# Patient Record
Sex: Female | Born: 1960
Health system: Southern US, Community
[De-identification: ages and names within clinical notes are randomized; demographics above are authoritative.]

## PROBLEM LIST (undated history)

## (undated) DIAGNOSIS — I341 Nonrheumatic mitral (valve) prolapse: Secondary | ICD-10-CM

## (undated) DIAGNOSIS — C4360 Malignant melanoma of unspecified upper limb, including shoulder: Secondary | ICD-10-CM

## (undated) DIAGNOSIS — I4891 Unspecified atrial fibrillation: Secondary | ICD-10-CM

## (undated) DIAGNOSIS — K219 Gastro-esophageal reflux disease without esophagitis: Secondary | ICD-10-CM

## (undated) DIAGNOSIS — F419 Anxiety disorder, unspecified: Secondary | ICD-10-CM

## (undated) HISTORY — DX: Anxiety disorder, unspecified: F41.9

## (undated) HISTORY — DX: Nonrheumatic mitral (valve) prolapse: I34.1

## (undated) HISTORY — DX: Unspecified atrial fibrillation: I48.91

## (undated) HISTORY — PX: MELANOMA EXCISION: SHX5266

## (undated) HISTORY — PX: REPLACEMENT TOTAL KNEE: SUR1224

## (undated) HISTORY — PX: ARTHROSCOPIC REPAIR ACL: SUR80

## (undated) HISTORY — DX: Malignant melanoma of unspecified upper limb, including shoulder: C43.60

## (undated) HISTORY — DX: Gastro-esophageal reflux disease without esophagitis: K21.9

---

## 1999-05-07 ENCOUNTER — Other Ambulatory Visit: Admission: RE | Admit: 1999-05-07 | Discharge: 1999-05-07 | Payer: Self-pay | Admitting: Obstetrics and Gynecology

## 2001-02-12 ENCOUNTER — Other Ambulatory Visit: Admission: RE | Admit: 2001-02-12 | Discharge: 2001-02-12 | Payer: Self-pay | Admitting: Obstetrics and Gynecology

## 2003-03-08 ENCOUNTER — Other Ambulatory Visit: Admission: RE | Admit: 2003-03-08 | Discharge: 2003-03-08 | Payer: Self-pay | Admitting: Obstetrics and Gynecology

## 2007-07-29 ENCOUNTER — Ambulatory Visit: Payer: Self-pay | Admitting: Internal Medicine

## 2013-05-11 ENCOUNTER — Ambulatory Visit: Payer: Self-pay | Admitting: Obstetrics and Gynecology

## 2013-05-18 ENCOUNTER — Ambulatory Visit: Payer: Self-pay | Admitting: Obstetrics and Gynecology

## 2013-06-23 ENCOUNTER — Ambulatory Visit: Payer: Self-pay | Admitting: Surgery

## 2013-06-23 HISTORY — PX: BREAST BIOPSY: SHX20

## 2013-07-12 ENCOUNTER — Ambulatory Visit: Payer: Self-pay | Admitting: Anesthesiology

## 2013-07-14 ENCOUNTER — Ambulatory Visit: Payer: Self-pay | Admitting: Surgery

## 2013-07-14 HISTORY — PX: BREAST EXCISIONAL BIOPSY: SUR124

## 2013-07-18 LAB — PATHOLOGY REPORT

## 2014-04-06 ENCOUNTER — Ambulatory Visit: Payer: Self-pay | Admitting: Cardiovascular Disease

## 2014-07-08 ENCOUNTER — Ambulatory Visit: Payer: Self-pay

## 2014-08-15 DIAGNOSIS — M1712 Unilateral primary osteoarthritis, left knee: Secondary | ICD-10-CM | POA: Insufficient documentation

## 2014-08-15 HISTORY — DX: Unilateral primary osteoarthritis, left knee: M17.12

## 2014-08-18 ENCOUNTER — Ambulatory Visit: Payer: Self-pay | Admitting: Unknown Physician Specialty

## 2015-03-23 NOTE — Op Note (Signed)
PATIENT NAME:  Kathleen Perkins, Kathleen Perkins MR#:  [id number] DATE OF BIRTH:  07-13-1961  DATE OF PROCEDURE:  07/14/2013  PREOPERATIVE DIAGNOSIS: Left breast mass.   POSTOPERATIVE DIAGNOSIS: Left breast mass.   PROCEDURE: Excision, left breast mass.   SURGEON: Rochel Brome, M.D.   ANESTHESIA:  Local with monitored anesthesia care.   INDICATIONS: This 54 year old female had an ultrasound finding of a small nodule, six o'clock position left breast. Fine needle aspiration was done removing a small amount of fluid, but did not resolve the nodule, as there appeared to be a solid component and cytology was nondiagnostic. We discussed observation versus excision, and the patient much preferred to have it excised for definitive diagnosis. She did have preop ultrasound-guided insertion of Kopans wire, which entered the breast at approximately the five o'clock position. Also had followup mammogram. I have reviewed the ultrasound and the mammogram with Dr. Martinique, the radiologist.   DESCRIPTION OF PROCEDURE:  The patient was placed on the operating table in the supine position and monitored and sedated by the anesthesia staff. The dressing was removed from the left breast exposing the Kopans wire. The wire was cut 2 cm from the skin. The breast was prepared with ChloraPrep and draped in a sterile manner.   The skin from approximately 5 o'clock  to 7 o'clock position of the left breast, some 2 cm below the border of the areola, was infiltrated with 1% Xylocaine with epinephrine. A transversely oriented curvilinear incision was made approximately 3 cm in length, carried down through subcutaneous tissues and encountered the wire. Next, a sample of tissue surrounding the thick portion of the wire was excised. This portion of the tissue was approximately 1.2 x 1.2 x 2.8 cm in dimension and was excised and submitted with the Kopans wire  and tagged for routine pathology. The wound was inspected. One clamped bleeding point was  suture ligated with 4-0 chromic. Several tiny bleeding points were cauterized. The wound was carefully inspected and hemostasis was intact. Next, the wound was closed with a running 5-0 Monocryl subcuticular suture and Dermabond. The patient tolerated the surgery satisfactorily and was then prepared for transfer to the recovery room.     ____________________________ Lenna Sciara. Rochel Brome, MD jws:dmm D: 07/14/2013 12:14:31 ET T: 07/14/2013 12:29:41 ET JOB#: [id number]  cc: Loreli Dollar, MD, <Dictator> Loreli Dollar MD ELECTRONICALLY SIGNED 07/15/2013 19:04

## 2015-04-14 IMAGING — US ULTRASOUND LEFT BREAST
1 series · 13 of 25 positions shown · non-contrast
Comparison: none

REASON FOR EXAM: av lt asymmetry
COMMENTS:

PROCEDURE:     US  - US BREAST LEFT  - May 18, 2013 [DATE]
RESULT:
Comparisons: Mammograms from [REDACTED], [HOSPITAL] 08/29/2010 and
07/28/2007.

[Series 1: ultrasound left breast · 0.08mm/px · 13 of 40 slices shown]
[im 1/40]
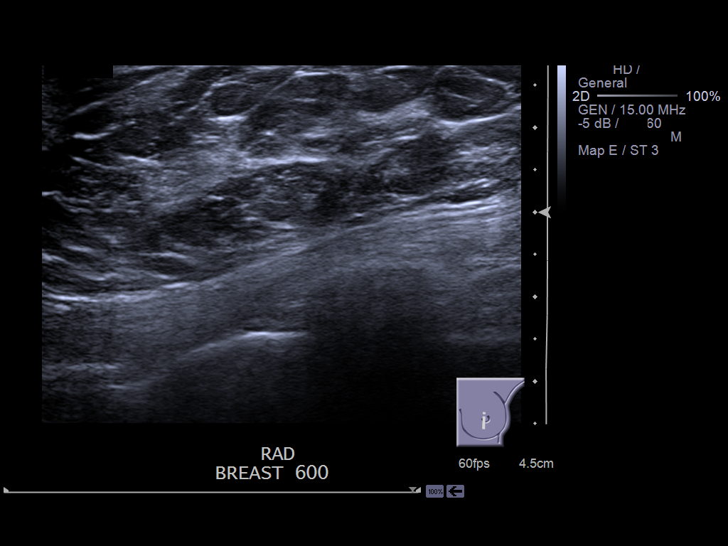
[im 4/40]
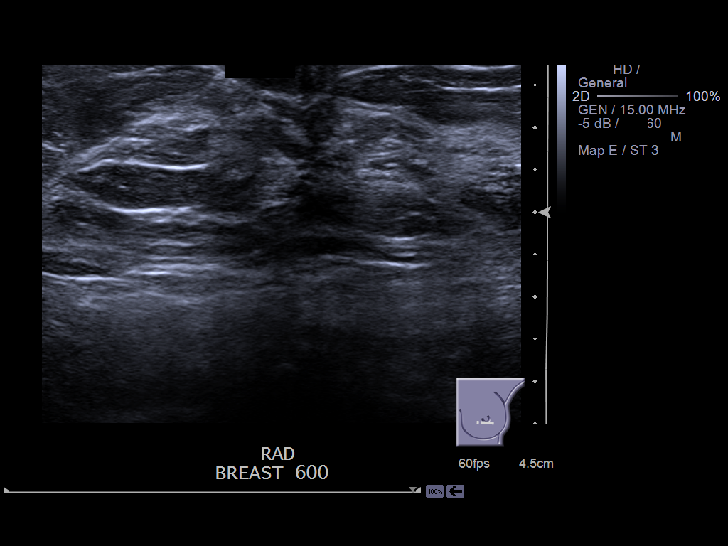
[im 7/40]
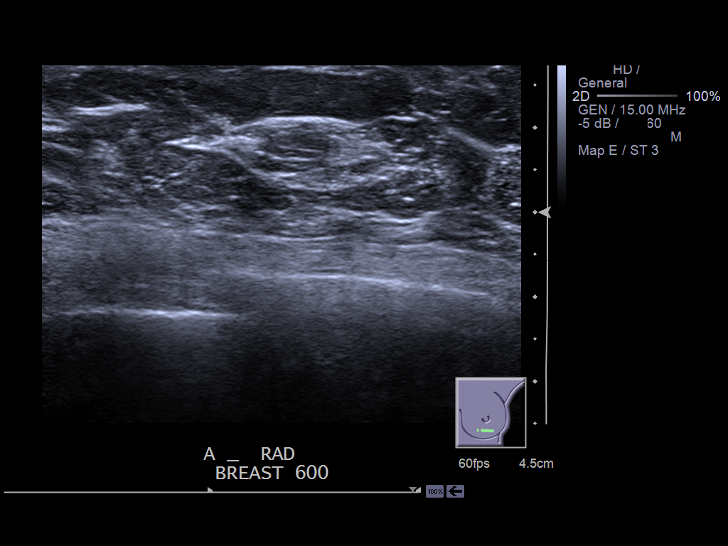
[im 10/40]
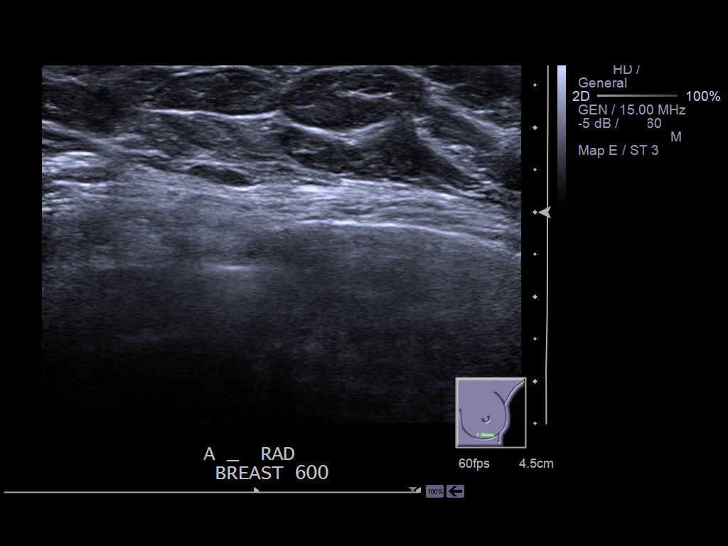
[im 14/40]
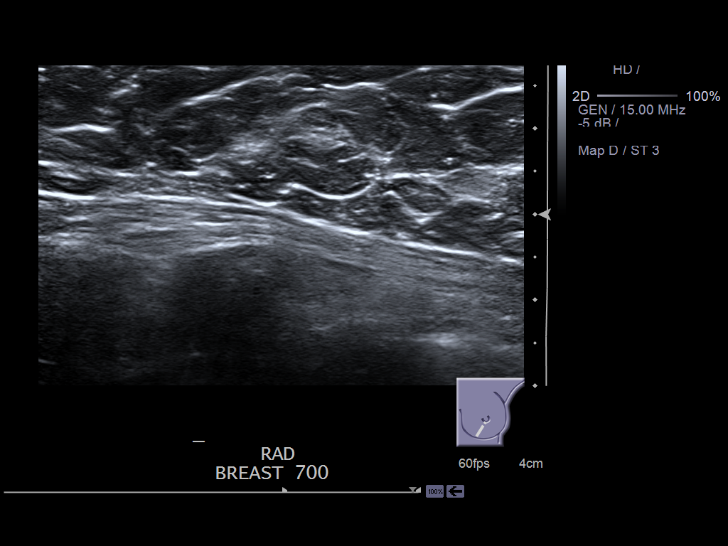
[im 17/40]
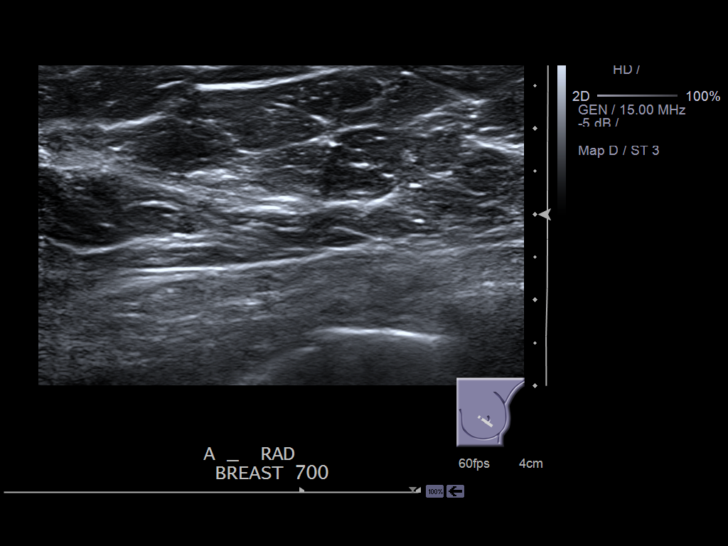
[im 20/40]
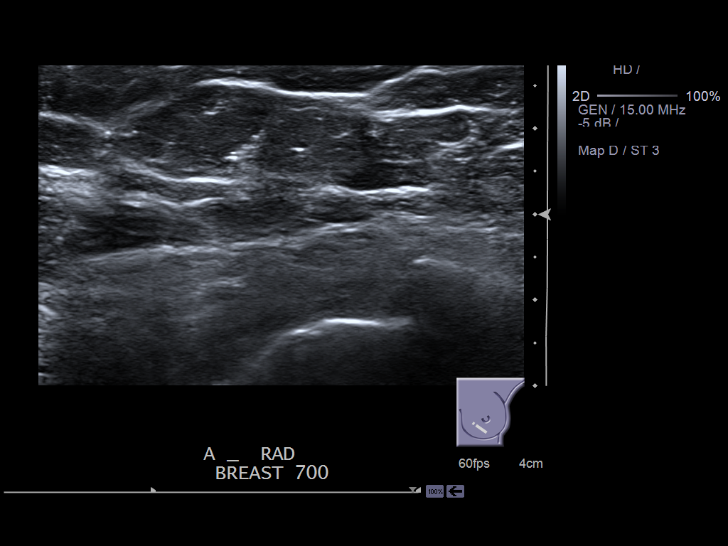
[im 23/40]
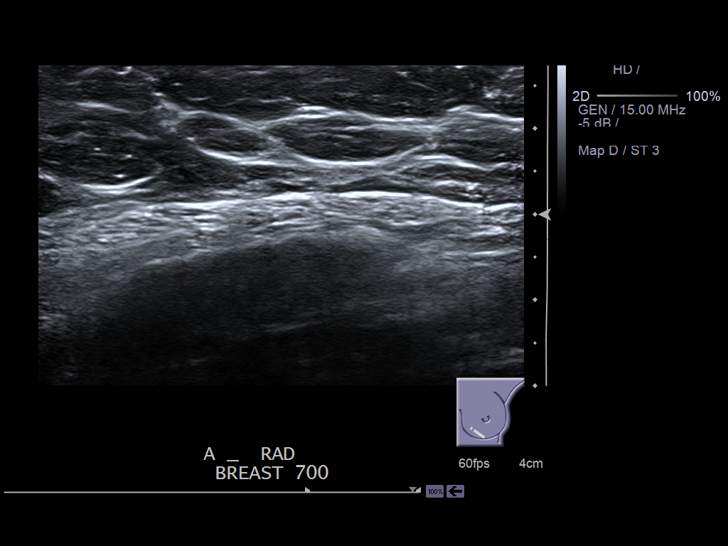
[im 27/40]
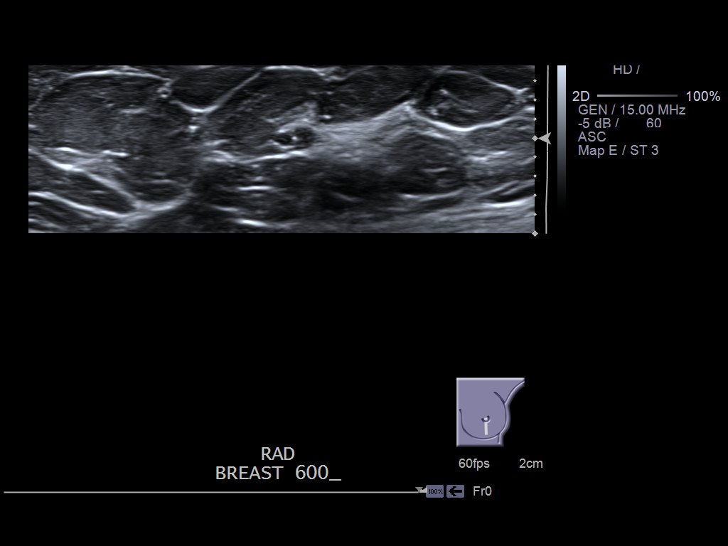
[im 30/40]
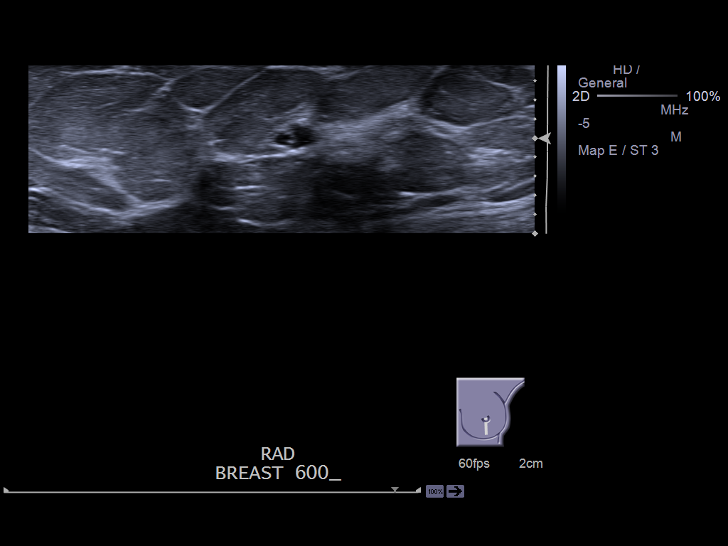
[im 33/40]
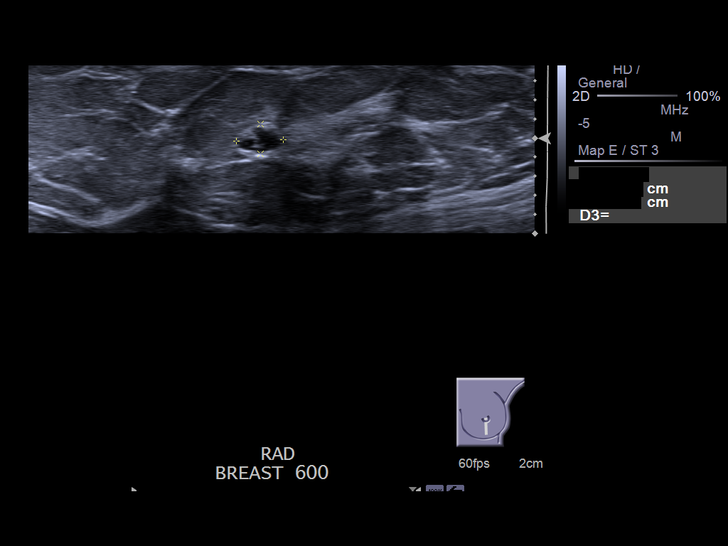
[im 36/40]
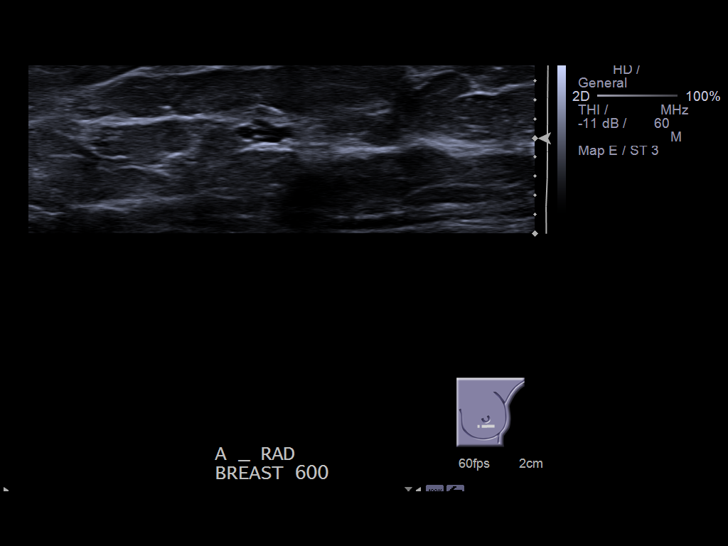
[im 40/40]
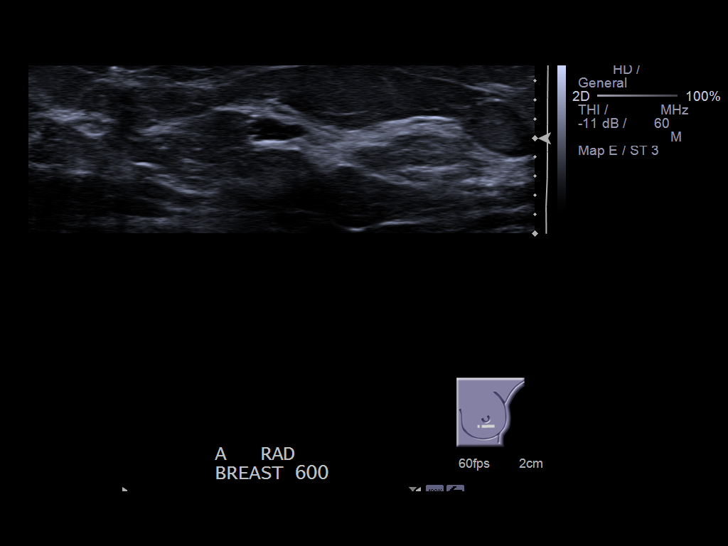

[13 of 25 positions shown; findings below may reference images not displayed]

FINDINGS: True lateral and spot compression magnification views were performed to
evaluate the findings in the left CC view on the screening mammograms. These
views demonstrate a small round mass at or just medial of the level of the
nipple line in the posterior depth. It measures approximately 5 mm in
greatest dimension. Most of the borders are well circumscribed, while other
borders are obscured by overlapping fibroglandular tissue. It appears to be
located in the inferior aspect of the left breast on the spot compression
magnification view at approximately 6-7 o'clock.

Real time ultrasound was performed of the inferior left breast from 6
o'clock through 7 o'clock. A 6 o'clock there is a small oval mass which is
mostly anechoic though there are some linear areas of hyperechogenicity
within the mass. There does not appear to be significant posterior acoustic
enhancement or shadowing. It measures 0.6 x 0.5 x 0.3 cm. This could
represent a complicated/septated cyst, but a solid mass cannot be excluded.
IMPRESSION: BI-RADS: Category 4 - Suspicious Abnormality.

1. There is a small mass in the inferior left breast at 6 o'clock which
could represent a complicated/septated cyst. However, a small solid mass
cannot be excluded. Attempt at cyst aspiration, with potential for biopsy if
solid, is recommended.

2. This was discussed with the patient in person immediately after the
ultrasound examination.

## 2015-04-14 IMAGING — MG MM ADDITIONAL VIEWS AT NO CHARGE
2 series · 4 of 4 positions shown · non-contrast
Comparison: none

REASON FOR EXAM: av lt asymmetry
COMMENTS:

PROCEDURE:     MAM - MAM DGTL ADD VW LT  SCR  - May 18, 2013  [DATE]
RESULT:
Comparisons: Mammograms from [REDACTED], [HOSPITAL] 08/29/2010 and
07/28/2007.

[L ML · left · 2 of 2 slices shown (1 of 2)]
[im 1/2]
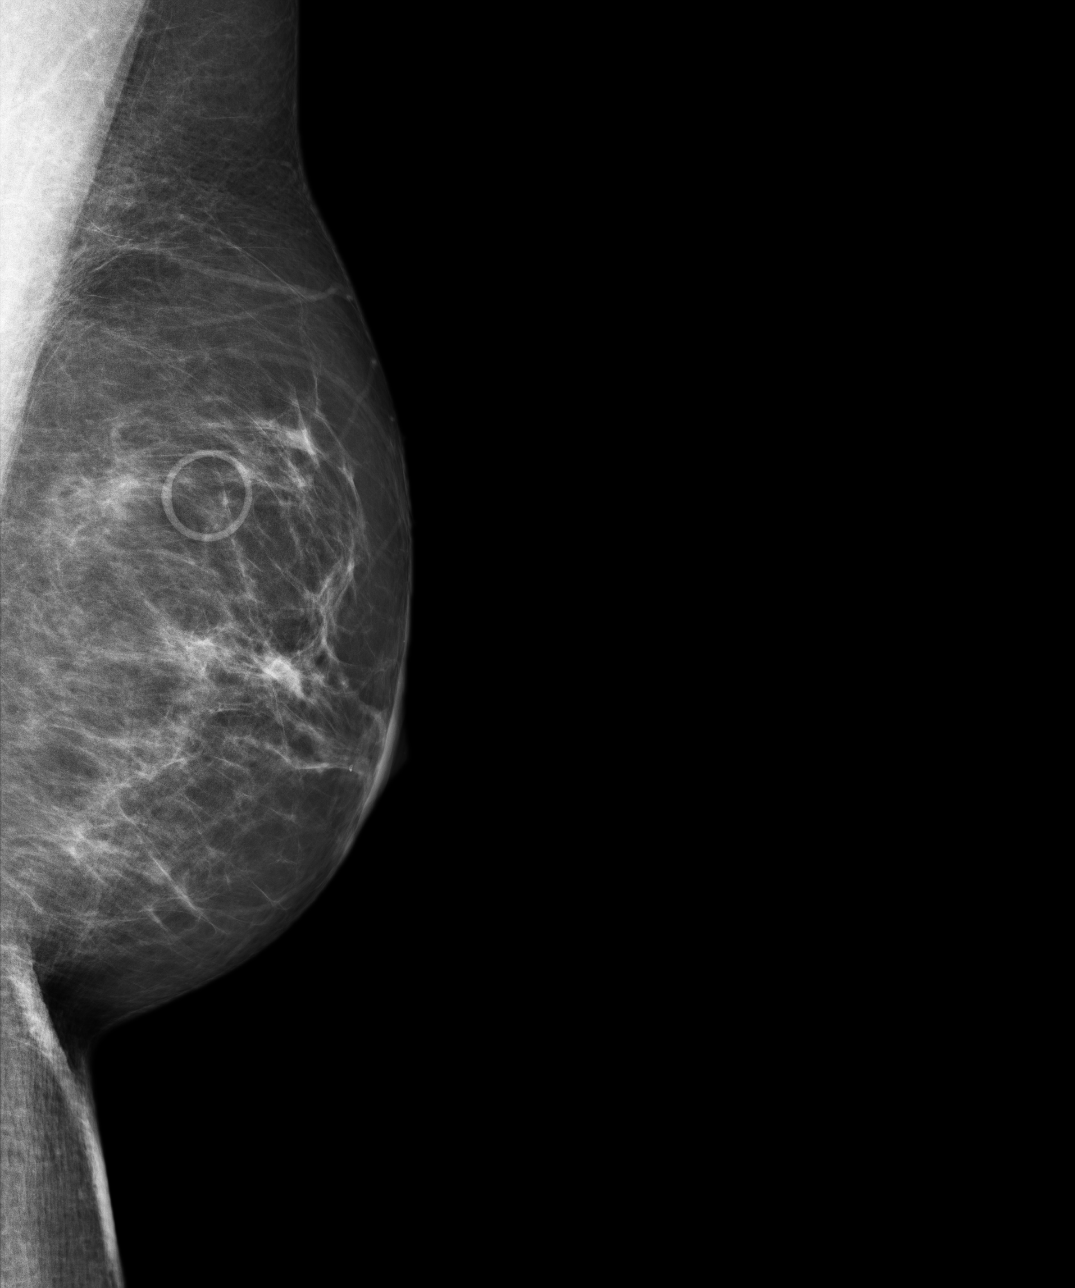
[im 2/2]
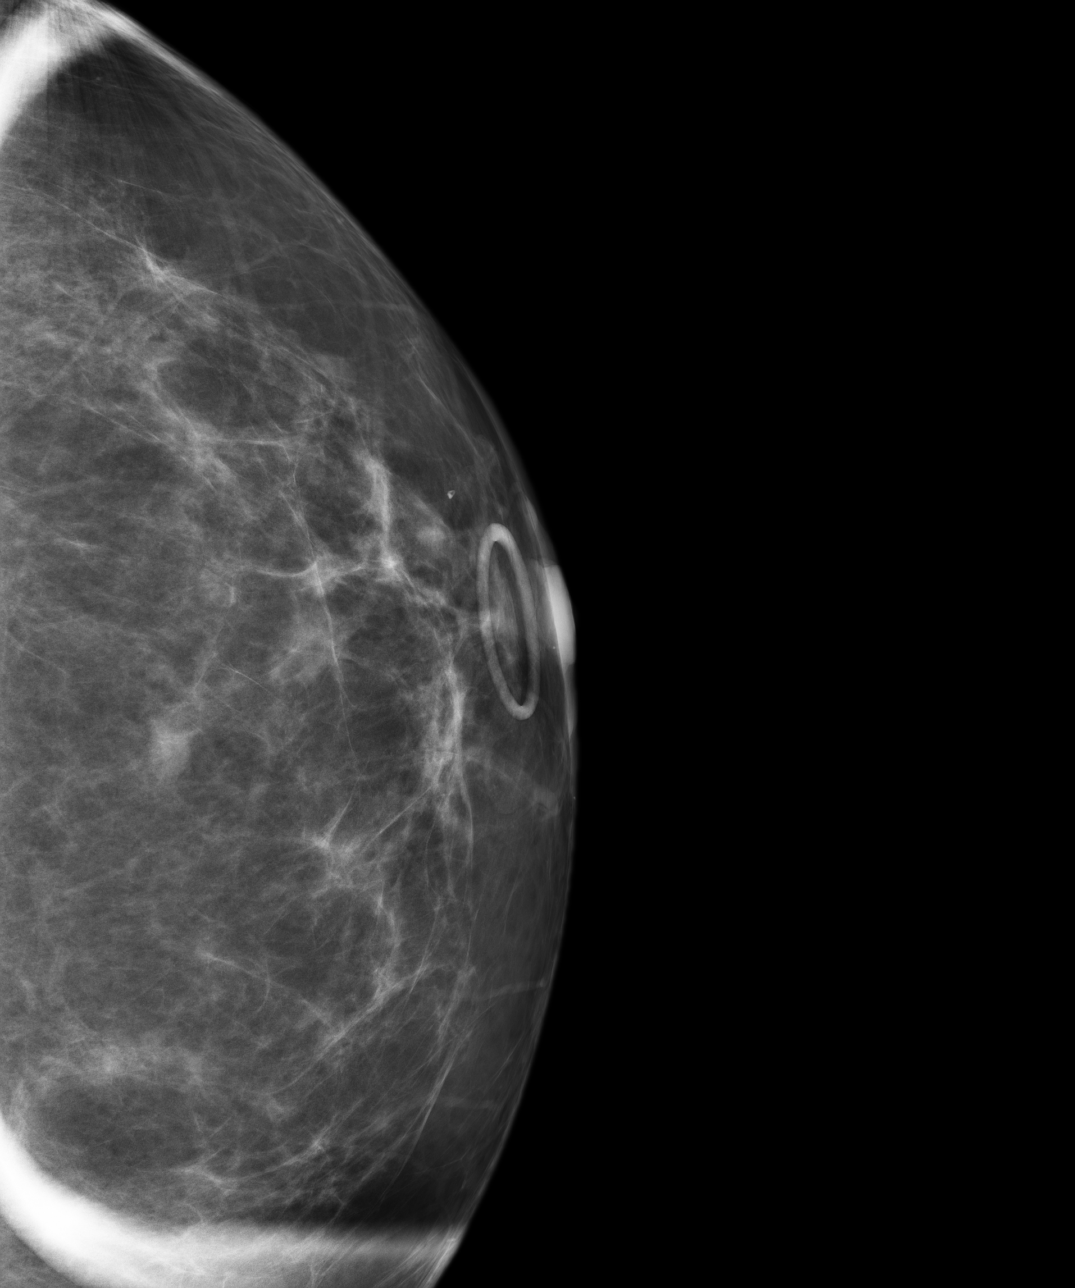

[L ML · left · 2 of 2 slices shown (2 of 2)]
[im 1/2]
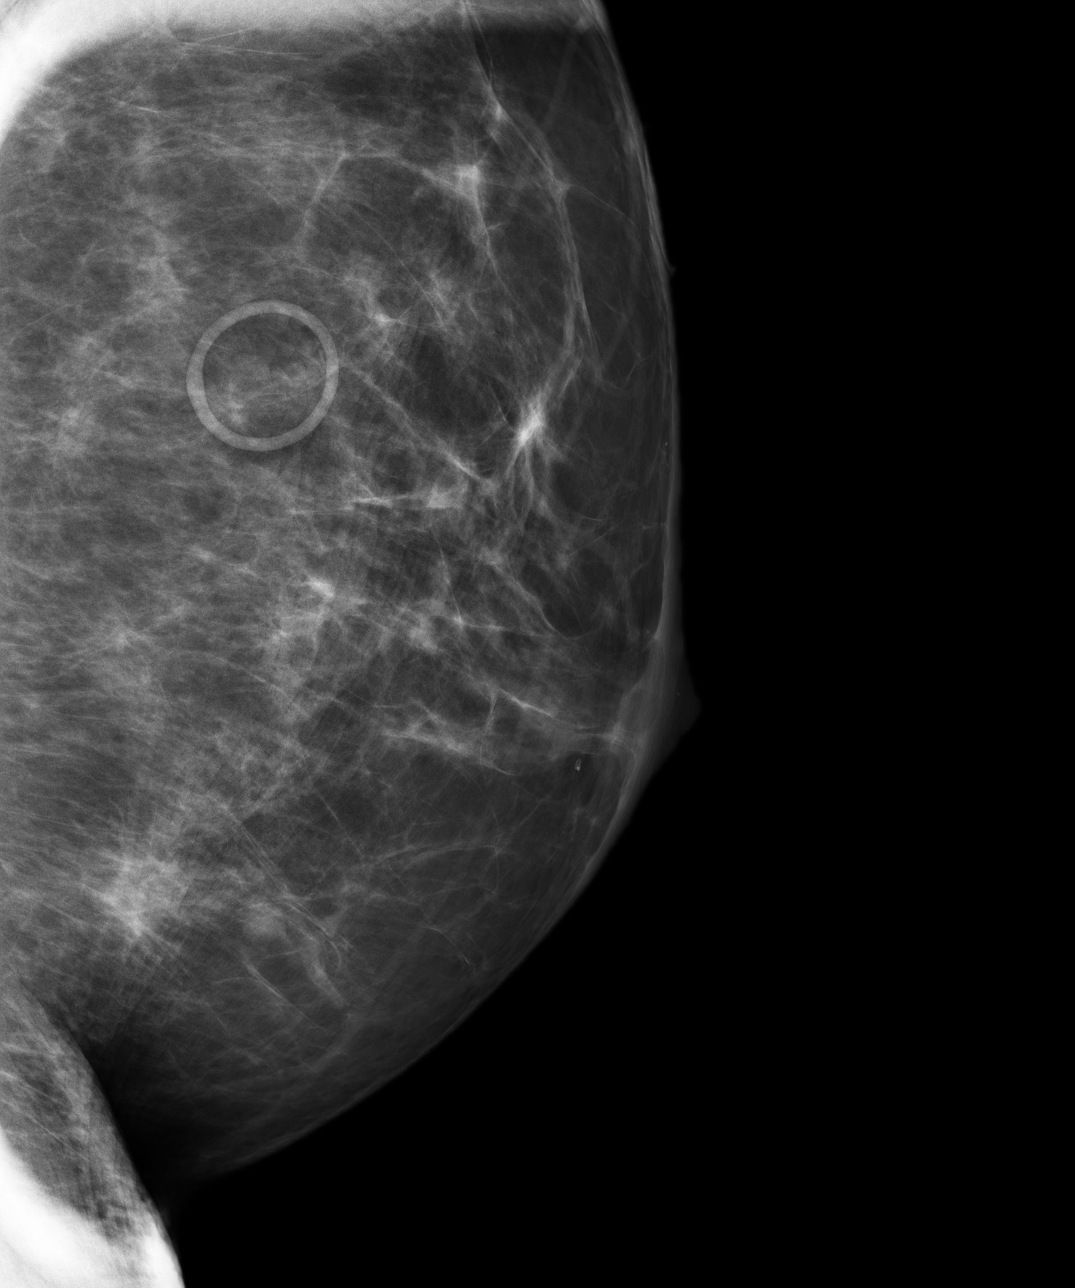
[im 2/2]
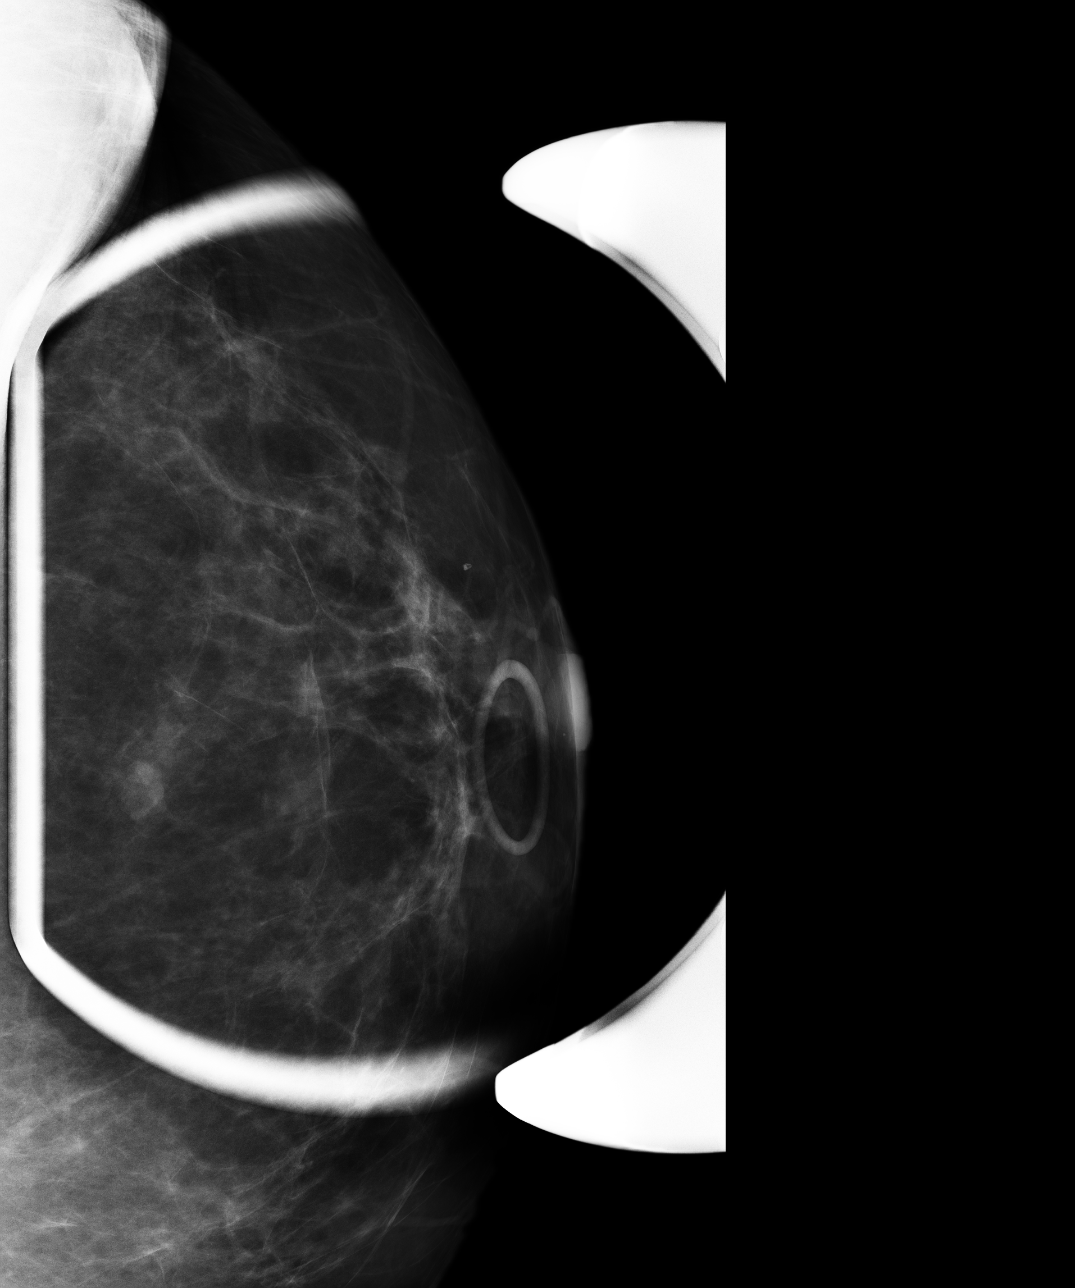

[4 of 4 positions shown; findings below may reference images not displayed]

FINDINGS: True lateral and spot compression magnification views were performed to
evaluate the findings in the left CC view on the screening mammograms. These
views demonstrate a small round mass at or just medial of the level of the
nipple line in the posterior depth. It measures approximately 5 mm in
greatest dimension. Most of the borders are well circumscribed, while other
borders are obscured by overlapping fibroglandular tissue. It appears to be
located in the inferior aspect of the left breast on the spot compression
magnification view at approximately 6-7 o'clock.

Real time ultrasound was performed of the inferior left breast from 6
o'clock through 7 o'clock. A 6 o'clock there is a small oval mass which is
mostly anechoic though there are some linear areas of hyperechogenicity
within the mass. There does not appear to be significant posterior acoustic
enhancement or shadowing. It measures 0.6 x 0.5 x 0.3 cm. This could
represent a complicated/septated cyst, but a solid mass cannot be excluded.
IMPRESSION: BI-RADS: Category 4 - Suspicious Abnormality.

1. There is a small mass in the inferior left breast at 6 o'clock which
could represent a complicated/septated cyst. However, a small solid mass
cannot be excluded. Attempt at cyst aspiration, with potential for biopsy if
solid, is recommended.

2. This was discussed with the patient in person immediately after the
ultrasound examination.

A NEGATIVE MAMMOGRAM REPORT DOES NOT PRECLUDE BIOPSY OR OTHER EVALUATION OF
A CLINICALLY PALPABLE OR OTHERWISE SUSPICIOUS MASS OR LESION. BREAST CANCER
MAY NOT BE DETECTED BY MAMMOGRAPHY IN UP TO 10% OF CASES.

## 2015-04-20 ENCOUNTER — Other Ambulatory Visit: Payer: Self-pay | Admitting: Neurosurgery

## 2015-04-20 ENCOUNTER — Ambulatory Visit
Admission: RE | Admit: 2015-04-20 | Discharge: 2015-04-20 | Disposition: A | Discharge status: Home / Self Care | Payer: 59 | Source: Ambulatory Visit | Attending: Neurosurgery | Admitting: Neurosurgery

## 2015-04-20 DIAGNOSIS — M5030 Other cervical disc degeneration, unspecified cervical region: Secondary | ICD-10-CM | POA: Diagnosis not present

## 2015-04-20 DIAGNOSIS — M542 Cervicalgia: Secondary | ICD-10-CM

## 2015-05-25 ENCOUNTER — Ambulatory Visit: Payer: Self-pay | Admitting: Cardiovascular Disease

## 2015-06-01 ENCOUNTER — Ambulatory Visit (INDEPENDENT_AMBULATORY_CARE_PROVIDER_SITE_OTHER): Payer: 59 | Admitting: Cardiovascular Disease

## 2015-06-01 ENCOUNTER — Encounter: Payer: Self-pay | Admitting: Cardiovascular Disease

## 2015-06-01 VITALS — BP 132/80 | HR 90 | Ht 64.0 in | Wt 182.5 lb

## 2015-06-01 DIAGNOSIS — F32A Depression, unspecified: Secondary | ICD-10-CM | POA: Insufficient documentation

## 2015-06-01 DIAGNOSIS — R0789 Other chest pain: Secondary | ICD-10-CM

## 2015-06-01 DIAGNOSIS — F418 Other specified anxiety disorders: Secondary | ICD-10-CM

## 2015-06-01 DIAGNOSIS — R0602 Shortness of breath: Secondary | ICD-10-CM | POA: Insufficient documentation

## 2015-06-01 DIAGNOSIS — I499 Cardiac arrhythmia, unspecified: Secondary | ICD-10-CM | POA: Diagnosis not present

## 2015-06-01 DIAGNOSIS — F419 Anxiety disorder, unspecified: Secondary | ICD-10-CM | POA: Insufficient documentation

## 2015-06-01 DIAGNOSIS — F329 Major depressive disorder, single episode, unspecified: Secondary | ICD-10-CM

## 2015-06-01 LAB — HM PAP SMEAR

## 2015-06-01 MED ORDER — ATENOLOL 25 MG PO TABS: 25.0000 mg | ORAL_TABLET | Freq: Two times a day (BID) | ORAL | Status: DC | PRN

## 2015-06-01 NOTE — Assessment & Plan Note (Signed)
Likely has APCs and PVCs, likely exacerbated by stress Would continue atenolol once a day, extra atenolol if needed for breakthrough arrhythmia

## 2015-06-01 NOTE — Assessment & Plan Note (Signed)
Atypical chest pain, comes on at rest, trouble taking a deep breath. More like a tightness. Wakes up with the symptoms. Likely more related to anxiety otherwise is very active at baseline with no symptoms

## 2015-06-01 NOTE — Assessment & Plan Note (Signed)
Again shortness of breath symptoms likely secondary to anxiety. No other clinical signs of heart failure or ischemia

## 2015-06-01 NOTE — Assessment & Plan Note (Signed)
Symptoms today are concerning for anxiety, depression Almost tearful at times. She was willing to consider meeting with a therapist. She will start sertraline at low-dose 25 mg daily As she does not have a physician to do further medication titration, we will make a referral
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
## 2015-06-01 NOTE — Patient Instructions (Addendum)
You are doing well. No medication changes were made.  Please take extra atenolol as needed for tachycardia,palpitations Xanax as needed for stress  Please call Dr. Nicolasa Ducking at [phone] to discuss an appt If she is unavailable, she can provide you w/ a list of potential providers  Please call us if you have new issues that need to be addressed before your next appt.

## 2015-06-08 ENCOUNTER — Telehealth: Payer: Self-pay | Admitting: *Deleted

## 2015-06-08 NOTE — Telephone Encounter (Signed)
Pt is asking about [employer] paper work she brought to apt last week.  Please advise.  She said we would have it and it should have been sent by now to her employer

## 2015-06-08 NOTE — Telephone Encounter (Signed)
Paperwork in Dr. Donivan Scull basket.

## 2015-06-08 NOTE — Telephone Encounter (Signed)
Completed FMLA paperwork mailed to [facility], [place], [place], [place] [address], Attn: Franz Dell.

## 2016-02-07 ENCOUNTER — Emergency Department

## 2016-02-07 ENCOUNTER — Emergency Department
Admission: EM | Admit: 2016-02-07 | Discharge: 2016-02-07 | Disposition: A | Discharge status: Home / Self Care | Attending: Emergency Medicine | Admitting: Emergency Medicine

## 2016-02-07 ENCOUNTER — Encounter: Payer: Self-pay | Admitting: *Deleted

## 2016-02-07 DIAGNOSIS — Y9389 Activity, other specified: Secondary | ICD-10-CM | POA: Insufficient documentation

## 2016-02-07 DIAGNOSIS — Z7982 Long term (current) use of aspirin: Secondary | ICD-10-CM | POA: Insufficient documentation

## 2016-02-07 DIAGNOSIS — Y998 Other external cause status: Secondary | ICD-10-CM | POA: Diagnosis not present

## 2016-02-07 DIAGNOSIS — S0121XA Laceration without foreign body of nose, initial encounter: Secondary | ICD-10-CM | POA: Diagnosis not present

## 2016-02-07 DIAGNOSIS — Z79899 Other long term (current) drug therapy: Secondary | ICD-10-CM | POA: Diagnosis not present

## 2016-02-07 DIAGNOSIS — Y9289 Other specified places as the place of occurrence of the external cause: Secondary | ICD-10-CM | POA: Insufficient documentation

## 2016-02-07 DIAGNOSIS — S0990XA Unspecified injury of head, initial encounter: Secondary | ICD-10-CM | POA: Diagnosis not present

## 2016-02-07 DIAGNOSIS — S022XXA Fracture of nasal bones, initial encounter for closed fracture: Secondary | ICD-10-CM | POA: Diagnosis not present

## 2016-02-07 DIAGNOSIS — Z046 Encounter for general psychiatric examination, requested by authority: Secondary | ICD-10-CM | POA: Diagnosis present

## 2016-02-07 MED ORDER — DOCUSATE SODIUM 100 MG PO CAPS: ORAL_CAPSULE | ORAL | Status: DC

## 2016-02-07 MED ORDER — HYDROCODONE-ACETAMINOPHEN 5-325 MG PO TABS
2.0000 | ORAL_TABLET | Freq: Once | ORAL | Status: AC
  Administered 2016-02-07: 2 via ORAL
  Filled 2016-02-07: qty 2

## 2016-02-07 MED ORDER — HYDROCODONE-ACETAMINOPHEN 5-325 MG PO TABS: 1.0000 | ORAL_TABLET | ORAL | Status: DC | PRN

## 2016-02-07 NOTE — Discharge Instructions (Signed)
Nasal Fracture A nasal fracture is a break or crack in the bones or cartilage of the nose. Minor breaks do not require treatment. These breaks usually heal on their own after about one month. Serious breaks may require surgery. CAUSES This injury is usually caused by a blunt injury to the nose. This type of injury often occurs from:  Contact sports.  Car accidents.  Falls.  Getting punched. SYMPTOMS Symptoms of this injury include:  Pain.  Swelling of the nose.  Bleeding from the nose.  Bruising around the nose or eyes. This may include having black eyes.  Crooked appearance of the nose. DIAGNOSIS This injury may be diagnosed with a physical exam. The health care provider will gently feel the nose for signs of broken bones. He or she will look inside the nostrils to make sure that there is not a blood-filled swelling on the dividing wall between the nostrils (septal hematoma). X-rays of the nose may not show a nasal fracture even when one is present. In some cases, X-rays or a CT scan may be done 1-5 days after the injury. Sometimes, the health care provider will want to wait until the swelling has gone down. TREATMENT Often, minor fractures that have caused no deformity do not require treatment. More serious fractures in which bones have moved out of position may require surgery, which will take place after the swelling is gone. Surgery will stabilize and align the fracture. In some cases, a health care provider may be able to reposition the bones without surgery. This may be done in the health care provider's office after medicine is given to numb the area (local anesthetic). HOME CARE INSTRUCTIONS  If directed, apply ice to the injured area:  Put ice in a plastic bag.  Place a towel between your skin and the bag.  Leave the ice on for 20 minutes, 2-3 times per day.  Take over-the-counter and prescription medicines only as told by your health care provider.  If your nose  starts to bleed, sit in an upright position while you squeeze the soft parts of your nose against the dividing wall between your nostrils (septum) for 10 minutes.  Try to avoid blowing your nose.  Return to your normal activities as told by your health care provider. Ask your health care provider what activities are safe for you.  Avoid contact sports for 3-4 weeks or as told by your health care provider.  Keep all follow-up visits as told by your health care provider. This is important. SEEK MEDICAL CARE IF:  Your pain increases or becomes severe.  You continue to have nosebleeds.  The shape of your nose does not return to normal within 5 days.  You have pus draining out of your nose. SEEK IMMEDIATE MEDICAL CARE IF:  You have bleeding from your nose that does not stop after you pinch your nostrils closed for 20 minutes and keep ice on your nose.  You have clear fluid draining out of your nose.  You notice a grape-like swelling on the septum. This swelling is a collection of blood (hematoma) that must be drained to help prevent infection.  You have difficulty moving your eyes.  You have repeated vomiting.   This information is not intended to replace advice given to you by your health care provider. Make sure you discuss any questions you have with your health care provider.   Document Released: 11/14/2000 Document Revised: 08/08/2015 Document Reviewed: 12/25/2014 Elsevier Interactive Patient Education [employer].  Stitches, Staples, or Adhesive Wound Closure Health care providers use stitches (sutures), staples, and certain glue (skin adhesives) to hold skin together while it heals (wound closure). You may need this treatment after you have surgery or if you cut your skin accidentally. These methods help your skin to heal more quickly and make it less likely that you will have a scar. A wound may take several months to heal completely. The type of wound you have  determines when your wound gets closed. In most cases, the wound is closed as soon as possible (primary skin closure). Sometimes, closure is delayed so the wound can be cleaned and allowed to heal naturally. This reduces the chance of infection. Delayed closure may be needed if your wound:  Is caused by a bite.  Happened more than 6 hours ago.  Involves loss of skin or the tissues under the skin.  Has dirt or debris in it that cannot be removed.  Is infected. WHAT ARE THE DIFFERENT KINDS OF WOUND CLOSURES? There are many options for wound closure. The one that your health care provider uses depends on how deep and how large your wound is. Adhesive Glue To use this type of glue to close a wound, your health care provider holds the edges of the wound together and paints the glue on the surface of your skin. You may need more than one layer of glue. Then the wound may be covered with a light bandage (dressing). This type of skin closure may be used for small wounds that are not deep (superficial). Using glue for wound closure is less painful than other methods. It does not require a medicine that numbs the area (local anesthetic). This method also leaves nothing to be removed. Adhesive glue is often used for children and on facial wounds. Adhesive glue cannot be used for wounds that are deep, uneven, or bleeding. It is not used inside of a wound.  Adhesive Strips These strips are made of sticky (adhesive), porous paper. They are applied across your skin edges like a regular adhesive bandage. You leave them on until they fall off. Adhesive strips may be used to close very superficial wounds. They may also be used along with sutures to improve the closure of your skin edges.  Sutures Sutures are the oldest method of wound closure. Sutures can be made from natural substances, such as silk, or from synthetic materials, such as nylon and steel. They can be made from a material that your body can break  down as your wound heals (absorbable), or they can be made from a material that needs to be removed from your skin (nonabsorbable). They come in many different strengths and sizes. Your health care provider attaches the sutures to a steel needle on one end. Sutures can be passed through your skin, or through the tissues beneath your skin. Then they are tied and cut. Your skin edges may be closed in one continuous stitch or in separate stitches. Sutures are strong and can be used for all kinds of wounds. Absorbable sutures may be used to close tissues under the skin. The disadvantage of sutures is that they may cause skin reactions that lead to infection. Nonabsorbable sutures need to be removed. Staples When surgical staples are used to close a wound, the edges of your skin on both sides of the wound are brought close together. A staple is placed across the wound, and an instrument secures the edges together. Staples are often used to close surgical  cuts (incisions). Staples are faster to use than sutures, and they cause less skin reaction. Staples need to be removed using a tool that bends the staples away from your skin. HOW DO I CARE FOR MY WOUND CLOSURE?  Take medicines only as directed by your health care provider.  If you were prescribed an antibiotic medicine for your wound, finish it all even if you start to feel better.  Use ointments or creams only as directed by your health care provider.  Wash your hands with soap and water before and after touching your wound.  Do not soak your wound in water. Do not take baths, swim, or use a hot tub until your health care provider approves.  Ask your health care provider when you can start showering. Cover your wound if directed by your health care provider.  Do not take out your own sutures or staples.  Do not pick at your wound. Picking can cause an infection.  Keep all follow-up visits as directed by your health care provider. This is  important. HOW LONG WILL I HAVE MY WOUND CLOSURE?  Leave adhesive glue on your skin until the glue peels away.  Leave adhesive strips on your skin until the strips fall off.  Absorbable sutures will dissolve within several days.  Nonabsorbable sutures and staples must be removed. The location of the wound will determine how long they stay in. This can range from several days to a couple of weeks. WHEN SHOULD I SEEK HELP FOR MY WOUND CLOSURE? Contact your health care provider if:  You have a fever.  You have chills.  You have drainage, redness, swelling, or pain at your wound.  There is a bad smell coming from your wound.  The skin edges of your wound start to separate after your sutures have been removed.  Your wound becomes thick, raised, and darker in color after your sutures come out (scarring).   This information is not intended to replace advice given to you by your health care provider. Make sure you discuss any questions you have with your health care provider.   Document Released: 08/12/2001 Document Revised: 12/08/2014 Document Reviewed: 04/26/2014 Elsevier Interactive Patient Education [employer].
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
## 2016-11-26 ENCOUNTER — Encounter: Payer: Self-pay | Admitting: Family Medicine

## 2016-11-26 ENCOUNTER — Ambulatory Visit (INDEPENDENT_AMBULATORY_CARE_PROVIDER_SITE_OTHER): Payer: Self-pay | Admitting: Family Medicine

## 2016-11-26 VITALS — BP 110/90 | HR 78 | Temp 99.1°F | Resp 16 | Wt 190.0 lb

## 2016-11-26 DIAGNOSIS — I499 Cardiac arrhythmia, unspecified: Secondary | ICD-10-CM

## 2016-11-26 DIAGNOSIS — S0101XA Laceration without foreign body of scalp, initial encounter: Secondary | ICD-10-CM

## 2016-11-26 DIAGNOSIS — F319 Bipolar disorder, unspecified: Secondary | ICD-10-CM | POA: Insufficient documentation

## 2016-11-26 MED ORDER — ATENOLOL 25 MG PO TABS: 25.0000 mg | ORAL_TABLET | Freq: Two times a day (BID) | ORAL | 6 refills | Status: DC | PRN

## 2016-11-26 NOTE — Progress Notes (Signed)
Patient: Kathleen Perkins Female    DOB: [birth year]   55 y.o.   MRN: [id number] Visit Date: 11/26/2016  Today's Provider: Lelon Huh, MD   Chief Complaint  Patient presents with  . Head Laceration   Subjective:    Patient slipped and fell backwards Sunday 11/23/2016 and hit her head the left side causing large laceration. States did have a lot of bleeding which has now scabbed over. Patient stated that she did have fatigue and sleepiness for about a day, but feels normal today. No neurological symptoms.  No dizziness, no visual changes, and no headaches. Patient has taken tylenol with mild relief.    Head Laceration  This is a new problem. The current episode started in the past 7 days (3 days ago). The problem occurs constantly. The problem has been unchanged. Associated symptoms include fatigue. Pertinent negatives include no abdominal pain, anorexia, arthralgias, change in bowel habit, chest pain, chills, congestion, coughing, diaphoresis, fever, headaches, joint swelling, myalgias, nausea, neck pain, numbness, rash, sore throat, swollen glands, urinary symptoms, vertigo, visual change, vomiting or weakness. Nothing aggravates the symptoms. She has tried acetaminophen for the symptoms. The treatment provided mild relief.   She doesn't think she lost consciousness. Feels well today. Feels slightly light headed. No trouble with memory or confusion today. No trouble with vision today.   Allergies  Allergen Reactions  . Codeine Nausea Only  . Erythromycin Nausea Only     Current Outpatient Prescriptions:  .  ALPRAZolam (XANAX) 0.5 MG tablet, Take 0.5 mg by mouth at bedtime as needed. , Disp: , Rfl:  .  aspirin EC 81 MG tablet, Take 81 mg by mouth daily. , Disp: , Rfl:  .  atenolol (TENORMIN) 25 MG tablet, Take 1 tablet (25 mg total) by mouth 2 (two) times daily as needed., Disp: 60 tablet, Rfl: 11 .  lamoTRIgine (LAMICTAL) 100 MG tablet, Take 100 mg by mouth 2 (two) times  daily., Disp: , Rfl:   Review of Systems  Constitutional: Positive for fatigue. Negative for appetite change, chills, diaphoresis and fever.  HENT: Negative for congestion and sore throat.   Respiratory: Negative for cough, chest tightness and shortness of breath.   Cardiovascular: Negative for chest pain and palpitations.  Gastrointestinal: Negative for abdominal pain, anorexia, change in bowel habit, nausea and vomiting.  Musculoskeletal: Negative for arthralgias, joint swelling, myalgias and neck pain.  Skin: Positive for wound. Negative for rash.  Neurological: Negative for dizziness, vertigo, weakness, numbness and headaches.    Social History  Substance Use Topics  . Smoking status: Never Smoker  . Smokeless tobacco: Not on file  . Alcohol use Yes   Objective:   BP 110/90 (BP Location: Right Arm, Patient Position: Sitting, Cuff Size: Normal)   Pulse 78   Temp 99.1 F (37.3 C) (Oral)   Resp 16   Wt 190 lb (86.2 kg)   SpO2 96%   BMI 31.62 kg/m   Physical Exam  General appearance: alert, well developed, well nourished, cooperative and in no distress Head: Normocephalic, without obvious abnormality, atraumatic Respiratory: Respirations even and unlabored, normal respiratory rate Skin. About 6cm long laceration scabbed over with no erythema or drainage.  Neurologic: Mental status: Alert, oriented to person, place, and time, thought content appropriate.     Assessment & Plan:      1. Laceration of scalp, initial encounter Healing appropriately with no sign of infection. No neurological symptoms.   2. Cardiac arrhythmia, unspecified  cardiac arrhythmia type Doing well with atenolol which she requests refill for today.  - atenolol (TENORMIN) 25 MG tablet; Take 1 tablet (25 mg total) by mouth 2 (two) times daily as needed.  Dispense: 60 tablet; Refill: 6       Lelon Huh, MD  [facility]

## 2017-02-13 ENCOUNTER — Ambulatory Visit (INDEPENDENT_AMBULATORY_CARE_PROVIDER_SITE_OTHER): Payer: BLUE CROSS/BLUE SHIELD | Admitting: Family Medicine

## 2017-02-13 ENCOUNTER — Encounter: Payer: Self-pay | Admitting: Family Medicine

## 2017-02-13 ENCOUNTER — Other Ambulatory Visit: Payer: Self-pay | Admitting: Family Medicine

## 2017-02-13 VITALS — BP 144/80 | HR 72 | Resp 16 | Wt 200.0 lb

## 2017-02-13 DIAGNOSIS — N3090 Cystitis, unspecified without hematuria: Secondary | ICD-10-CM

## 2017-02-13 DIAGNOSIS — M545 Low back pain, unspecified: Secondary | ICD-10-CM

## 2017-02-13 LAB — POCT URINALYSIS DIPSTICK
Glucose, UA: NEGATIVE
Ketones, UA: NEGATIVE
Nitrite, UA: POSITIVE
Protein, UA: NEGATIVE
Spec Grav, UA: 1.005 (ref 1.030–1.035)
Urobilinogen, UA: 0.2 (ref ?–2.0)
pH, UA: 6 (ref 5.0–8.0)

## 2017-02-13 MED ORDER — SULFAMETHOXAZOLE-TRIMETHOPRIM 800-160 MG PO TABS
1.0000 | ORAL_TABLET | Freq: Two times a day (BID) | ORAL | 0 refills | Status: DC
Start: 2017-02-13 — End: 2018-07-09

## 2017-02-13 NOTE — Progress Notes (Signed)
Patient: Kathleen Perkins Female    DOB: May 06, 1961   56 y.o.   MRN: [id number] Visit Date: 02/13/2017  Today's Provider: Vernie Murders, PA   Chief Complaint  Patient presents with  . Urinary Tract Infection   Subjective:    Urinary Tract Infection   This is a new problem. The current episode started 1 to 4 weeks ago. The problem has been gradually worsening. The pain is at a severity of 8/10 (back pain). The pain is severe. She is not sexually active. There is no history of pyelonephritis. Associated symptoms include chills, flank pain, frequency, nausea and urgency. Pertinent negatives include no hematuria, hesitancy, sweats or vomiting. Treatments tried: AZO, Zofran. The treatment provided moderate relief.   Patient Active Problem List   Diagnosis Date Noted  . Bipolar affective ([place]) 11/26/2016  . Anxiety and depression 06/01/2015  . Chest discomfort 06/01/2015  . Arrhythmia 06/01/2015  . Shortness of breath 06/01/2015   Past Medical History:  Diagnosis Date  . Anxiety   . Melanoma of upper arm ([place])   . MVP (mitral valve prolapse)    Past Surgical History:  Procedure Laterality Date  . ARTHROSCOPIC REPAIR ACL    . MELANOMA EXCISION     left arm.   . REPLACEMENT TOTAL KNEE     left    Family History  Problem Relation Age of Onset  . Heart disease Father     CABG x 4   . Hypertension Father   . Hyperlipidemia Father   . Heart disease Brother     CABG x 4   . Hyperlipidemia Brother   . Hypertension Brother   . Heart attack Brother       Allergies  Allergen Reactions  . Codeine Nausea Only  . Erythromycin Nausea Only     Current Outpatient Prescriptions:  .  ALPRAZolam (XANAX) 0.5 MG tablet, Take 0.5 mg by mouth at bedtime as needed. , Disp: , Rfl:  .  aspirin EC 81 MG tablet, Take 81 mg by mouth daily. , Disp: , Rfl:  .  atenolol (TENORMIN) 25 MG tablet, Take 1 tablet (25 mg total) by mouth 2 (two) times daily as needed., Disp: 60 tablet, Rfl:  6 .  lamoTRIgine (LAMICTAL) 100 MG tablet, Take 100 mg by mouth 2 (two) times daily., Disp: , Rfl:   Review of Systems  Constitutional: Positive for chills.  Gastrointestinal: Positive for nausea. Negative for vomiting.  Genitourinary: Positive for flank pain, frequency and urgency. Negative for hematuria and hesitancy.  Musculoskeletal: Positive for back pain.    Social History  Substance Use Topics  . Smoking status: Never Smoker  . Smokeless tobacco: Not on file  . Alcohol use Yes   Objective:   BP (!) 144/80 (BP Location: Right Arm, Patient Position: Sitting, Cuff Size: Large)   Pulse 72   Resp 16   Wt 200 lb (90.7 kg)   BMI 33.28 kg/m  Vitals:   02/13/17 1147  BP: (!) 144/80  Pulse: 72  Resp: 16  Weight: 200 lb (90.7 kg)     Physical Exam  Constitutional: She is oriented to person, place, and time. She appears well-developed and well-nourished. No distress.  HENT:  Head: Normocephalic and atraumatic.  Right Ear: Hearing normal.  Left Ear: Hearing normal.  Nose: Nose normal.  Eyes: Conjunctivae and lids are normal. Right eye exhibits no discharge. Left eye exhibits no discharge. No scleral icterus.  Cardiovascular: Normal rate and  regular rhythm.   Pulmonary/Chest: Effort normal. No respiratory distress.  Abdominal: Soft. Bowel sounds are normal. There is tenderness.  Slight suprapubic discomfort with back pain.   Musculoskeletal:  Soreness with occasional spasms in bilateral lower back/flanks. Some discomfort to transfer from sitting to standing or twisting motions.   Neurological: She is alert and oriented to person, place, and time.  Skin: Skin is intact. No lesion and no rash noted.  Psychiatric: She has a normal mood and affect. Her speech is normal and behavior is normal. Thought content normal.      Assessment & Plan:     1. Cystitis Onset of urinary frequency, urgency and nausea over the past couple weeks. No fever but some chills. Urinalysis showed a  few WBC's and positive nitrites (questionable with use of AZO-Standard). Will treat with antibiotic and get C&S. Increase fluid intake and recheck pending reports. - POCT urinalysis dipstick - sulfamethoxazole-trimethoprim (BACTRIM DS,SEPTRA DS) 800-160 MG tablet; Take 1 tablet by mouth 2 (two) times daily.  Dispense: 14 tablet; Refill: 0 - Urine culture  2. Bilateral low back pain without sciatica, unspecified chronicity Spasms and low back ache into each flank intermittently. Some worsening with certain movements. No radiation of pain into extremities. May use Percogesic, ASA, Aspercreme with Lidocaine or Icy Hot Smart Relief TENS patch. Stretching exercises and moist heat applications may be helpful. Recheck if no better in 10-14 days.     Patient seen and examined by Vernie Murders, PA, and note scribed by Renaldo Fiddler, CMA. Vernie Murders, PA  [facility]

## 2017-02-13 NOTE — Patient Instructions (Signed)
Urinary Tract Infection, Adult A urinary tract infection (UTI) is an infection of any part of the urinary tract, which includes the kidneys, ureters, bladder, and urethra. These organs make, store, and get rid of urine in the body. UTI can be a bladder infection (cystitis) or kidney infection (pyelonephritis). What are the causes? This infection may be caused by fungi, viruses, or bacteria. Bacteria are the most common cause of UTIs. This condition can also be caused by repeated incomplete emptying of the bladder during urination. What increases the risk? This condition is more likely to develop if:  You ignore your need to urinate or hold urine for long periods of time.  You do not empty your bladder completely during urination.  You wipe back to front after urinating or having a bowel movement, if you are female.  You are uncircumcised, if you are female.  You are constipated.  You have a urinary catheter that stays in place (indwelling).  You have a weak defense (immune) system.  You have a medical condition that affects your bowels, kidneys, or bladder.  You have diabetes.  You take antibiotic medicines frequently or for long periods of time, and the antibiotics no longer work well against certain types of infections (antibiotic resistance).  You take medicines that irritate your urinary tract.  You are exposed to chemicals that irritate your urinary tract.  You are female. What are the signs or symptoms? Symptoms of this condition include:  Fever.  Frequent urination or passing small amounts of urine frequently.  Needing to urinate urgently.  Pain or burning with urination.  Urine that smells bad or unusual.  Cloudy urine.  Pain in the lower abdomen or back.  Trouble urinating.  Blood in the urine.  Vomiting or being less hungry than normal.  Diarrhea or abdominal pain.  Vaginal discharge, if you are female. How is this diagnosed? This condition is  diagnosed with a medical history and physical exam. You will also need to provide a urine sample to test your urine. Other tests may be done, including:  Blood tests.  Sexually transmitted disease (STD) testing. If you have had more than one UTI, a cystoscopy or imaging studies may be done to determine the cause of the infections. How is this treated? Treatment for this condition often includes a combination of two or more of the following:  Antibiotic medicine.  Other medicines to treat less common causes of UTI.  Over-the-counter medicines to treat pain.  Drinking enough water to stay hydrated. Follow these instructions at home:  Take over-the-counter and prescription medicines only as told by your health care provider.  If you were prescribed an antibiotic, take it as told by your health care provider. Do not stop taking the antibiotic even if you start to feel better.  Avoid alcohol, caffeine, tea, and carbonated beverages. They can irritate your bladder.  Drink enough fluid to keep your urine clear or pale yellow.  Keep all follow-up visits as told by your health care provider. This is important.  Make sure to:  Empty your bladder often and completely. Do not hold urine for long periods of time.  Empty your bladder before and after sex.  Wipe from front to back after a bowel movement if you are female. Use each tissue one time when you wipe. Contact a health care provider if:  You have back pain.  You have a fever.  You feel nauseous or vomit.  Your symptoms do not get better after 3   days.  Your symptoms go away and then return. Get help right away if:  You have severe back pain or lower abdominal pain.  You are vomiting and cannot keep down any medicines or water. This information is not intended to replace advice given to you by your health care provider. Make sure you discuss any questions you have with your health care provider. Document Released:  08/27/2005 Document Revised: 04/30/2016 Document Reviewed: 10/08/2015 Elsevier Interactive Patient Education  2017 [employer]. Back Pain, Adult Back pain is very common in adults.The cause of back pain is rarely dangerous and the pain often gets better over time.The cause of your back pain may not be known. Some common causes of back pain include:  Strain of the muscles or ligaments supporting the spine.  Wear and tear (degeneration) of the spinal disks.  Arthritis.  Direct injury to the back. For many people, back pain may return. Since back pain is rarely dangerous, most people can learn to manage this condition on their own. Follow these instructions at home: Watch your back pain for any changes. The following actions may help to lessen any discomfort you are feeling:  Remain active. It is stressful on your back to sit or stand in one place for long periods of time. Do not sit, drive, or stand in one place for more than 30 minutes at a time. Take short walks on even surfaces as soon as you are able.Try to increase the length of time you walk each day.  Exercise regularly as directed by your health care provider. Exercise helps your back heal faster. It also helps avoid future injury by keeping your muscles strong and flexible.  Do not stay in bed.Resting more than 1-2 days can delay your recovery.  Pay attention to your body when you bend and lift. The most comfortable positions are those that put less stress on your recovering back. Always use proper lifting techniques, including:  Bending your knees.  Keeping the load close to your body.  Avoiding twisting.  Find a comfortable position to sleep. Use a firm mattress and lie on your side with your knees slightly bent. If you lie on your back, put a pillow under your knees.  Avoid feeling anxious or stressed.Stress increases muscle tension and can worsen back pain.It is important to recognize when you are anxious or  stressed and learn ways to manage it, such as with exercise.  Take medicines only as directed by your health care provider. Over-the-counter medicines to reduce pain and inflammation are often the most helpful.Your health care provider may prescribe muscle relaxant drugs.These medicines help dull your pain so you can more quickly return to your normal activities and healthy exercise.  Apply ice to the injured area:  Put ice in a plastic bag.  Place a towel between your skin and the bag.  Leave the ice on for 20 minutes, 2-3 times a day for the first 2-3 days. After that, ice and heat may be alternated to reduce pain and spasms.  Maintain a healthy weight. Excess weight puts extra stress on your back and makes it difficult to maintain good posture. Contact a health care provider if:  You have pain that is not relieved with rest or medicine.  You have increasing pain going down into the legs or buttocks.  You have pain that does not improve in one week.  You have night pain.  You lose weight.  You have a fever or chills. Get help right  away if:  You develop new bowel or bladder control problems.  You have unusual weakness or numbness in your arms or legs.  You develop nausea or vomiting.  You develop abdominal pain.  You feel faint. This information is not intended to replace advice given to you by your health care provider. Make sure you discuss any questions you have with your health care provider. Document Released: 11/17/2005 Document Revised: 03/27/2016 Document Reviewed: 03/21/2014 Elsevier Interactive Patient Education  2017 [employer].

## 2017-02-15 LAB — URINE CULTURE: Organism ID, Bacteria: NO GROWTH

## 2017-02-16 ENCOUNTER — Telehealth: Payer: Self-pay

## 2017-02-16 NOTE — Telephone Encounter (Signed)
No answer, no voicemail set up.  Thanks,  -Joseline

## 2017-02-16 NOTE — Telephone Encounter (Signed)
-----   Message from Margo Common, [place] sent at 02/16/2017  8:15 AM EDT ----- No bacterial growth on culture. May finish all the antibiotic if needed. Proceed with back treatment. Recheck if no better in 7-10 days. May need x-ray evaluation of back and kidneys at that time.

## 2017-02-17 NOTE — Telephone Encounter (Signed)
No answer-Mail box is full.  Thanks,  -Correen Bubolz 

## 2017-02-19 NOTE — Telephone Encounter (Signed)
Tried calling patient. No answer.Voice  Mail box full.  Letter mailed to patient informing her to call the office for results.

## 2017-02-25 DIAGNOSIS — C439 Malignant melanoma of skin, unspecified: Secondary | ICD-10-CM | POA: Insufficient documentation

## 2017-02-25 DIAGNOSIS — Z780 Asymptomatic menopausal state: Secondary | ICD-10-CM | POA: Insufficient documentation

## 2017-02-25 DIAGNOSIS — I341 Nonrheumatic mitral (valve) prolapse: Secondary | ICD-10-CM | POA: Insufficient documentation

## 2017-02-25 DIAGNOSIS — G8929 Other chronic pain: Secondary | ICD-10-CM | POA: Insufficient documentation

## 2017-02-25 DIAGNOSIS — M542 Cervicalgia: Secondary | ICD-10-CM

## 2017-02-25 DIAGNOSIS — Z85820 Personal history of malignant melanoma of skin: Secondary | ICD-10-CM | POA: Insufficient documentation

## 2017-02-27 ENCOUNTER — Ambulatory Visit: Payer: BLUE CROSS/BLUE SHIELD | Admitting: Family Medicine
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
## 2017-05-04 DIAGNOSIS — F419 Anxiety disorder, unspecified: Secondary | ICD-10-CM | POA: Diagnosis not present

## 2017-05-04 DIAGNOSIS — F3181 Bipolar II disorder: Secondary | ICD-10-CM | POA: Diagnosis not present

## 2017-05-18 ENCOUNTER — Encounter: Payer: BLUE CROSS/BLUE SHIELD | Admitting: Family Medicine

## 2017-09-07 DIAGNOSIS — F3181 Bipolar II disorder: Secondary | ICD-10-CM | POA: Diagnosis not present

## 2017-09-07 DIAGNOSIS — F419 Anxiety disorder, unspecified: Secondary | ICD-10-CM | POA: Diagnosis not present

## 2017-09-09 ENCOUNTER — Other Ambulatory Visit: Payer: Self-pay | Admitting: Family Medicine

## 2017-09-09 DIAGNOSIS — I499 Cardiac arrhythmia, unspecified: Secondary | ICD-10-CM

## 2017-09-09 NOTE — Telephone Encounter (Signed)
Pharmacy requesting refills. Thanks!  

## 2017-10-28 ENCOUNTER — Encounter: Payer: Self-pay | Admitting: *Deleted

## 2017-10-28 DIAGNOSIS — Z1211 Encounter for screening for malignant neoplasm of colon: Secondary | ICD-10-CM | POA: Diagnosis not present

## 2017-10-28 DIAGNOSIS — Z0000 Encounter for general adult medical examination without abnormal findings: Secondary | ICD-10-CM | POA: Diagnosis not present

## 2017-10-28 DIAGNOSIS — Z1231 Encounter for screening mammogram for malignant neoplasm of breast: Secondary | ICD-10-CM | POA: Diagnosis not present

## 2017-10-28 DIAGNOSIS — Z01419 Encounter for gynecological examination (general) (routine) without abnormal findings: Secondary | ICD-10-CM | POA: Diagnosis not present

## 2017-10-28 LAB — HM PAP SMEAR: HM Pap smear: NORMAL

## 2017-10-30 ENCOUNTER — Other Ambulatory Visit: Payer: Self-pay | Admitting: Obstetrics & Gynecology

## 2017-10-30 DIAGNOSIS — Z1231 Encounter for screening mammogram for malignant neoplasm of breast: Secondary | ICD-10-CM

## 2017-11-02 DIAGNOSIS — Z0000 Encounter for general adult medical examination without abnormal findings: Secondary | ICD-10-CM | POA: Diagnosis not present

## 2017-11-02 DIAGNOSIS — E538 Deficiency of other specified B group vitamins: Secondary | ICD-10-CM | POA: Diagnosis not present

## 2017-11-25 ENCOUNTER — Ambulatory Visit
Admission: RE | Admit: 2017-11-25 | Discharge: 2017-11-25 | Disposition: A | Discharge status: Home / Self Care | Payer: BLUE CROSS/BLUE SHIELD | Source: Ambulatory Visit | Attending: Obstetrics & Gynecology | Admitting: Obstetrics & Gynecology

## 2017-11-25 DIAGNOSIS — Z1231 Encounter for screening mammogram for malignant neoplasm of breast: Secondary | ICD-10-CM | POA: Diagnosis not present

## 2017-11-30 ENCOUNTER — Ambulatory Visit: Payer: 59

## 2017-12-14 ENCOUNTER — Telehealth: Payer: Self-pay | Admitting: Family Medicine

## 2017-12-14 NOTE — Telephone Encounter (Addendum)
Patient states that she took Maxide for years and would like to know if you will send in a prescription for a PRN dose.  She states that she doesn't remember if we prescribed this.  She is having swelling in face,ancles, and hands.  I advised patient that if we had not prescribed this or if it had been years ago that she would need to be seen.  She asked me to send back a message to see because she has a high deductible to meet. Patient uses [employer].

## 2017-12-15 NOTE — Telephone Encounter (Signed)
No

## 2017-12-16 NOTE — Telephone Encounter (Signed)
Called no answer, mailbox is full and I could not leave a [employer], RMA

## 2017-12-17 NOTE — Telephone Encounter (Signed)
No answer, mailbox is full -Kris Mouton, RMA

## 2017-12-21 NOTE — Telephone Encounter (Signed)
Letter mailed to home address with information below-Kathleen Perkins, RMA

## 2017-12-28 DIAGNOSIS — F3181 Bipolar II disorder: Secondary | ICD-10-CM | POA: Diagnosis not present

## 2017-12-28 DIAGNOSIS — F419 Anxiety disorder, unspecified: Secondary | ICD-10-CM | POA: Diagnosis not present

## 2018-01-26 ENCOUNTER — Ambulatory Visit: Payer: BLUE CROSS/BLUE SHIELD | Admitting: Family Medicine

## 2018-01-29 ENCOUNTER — Ambulatory Visit: Payer: BLUE CROSS/BLUE SHIELD | Admitting: Family Medicine

## 2018-05-03 DIAGNOSIS — F3181 Bipolar II disorder: Secondary | ICD-10-CM | POA: Diagnosis not present

## 2018-05-03 DIAGNOSIS — F419 Anxiety disorder, unspecified: Secondary | ICD-10-CM | POA: Diagnosis not present

## 2018-07-08 ENCOUNTER — Ambulatory Visit: Payer: BLUE CROSS/BLUE SHIELD | Admitting: Family Medicine

## 2018-07-09 ENCOUNTER — Encounter: Payer: Self-pay | Admitting: Family Medicine

## 2018-07-09 ENCOUNTER — Ambulatory Visit (INDEPENDENT_AMBULATORY_CARE_PROVIDER_SITE_OTHER): Payer: BLUE CROSS/BLUE SHIELD | Admitting: Family Medicine

## 2018-07-09 VITALS — BP 116/74 | HR 72 | Temp 97.8°F | Resp 16 | Wt 176.0 lb

## 2018-07-09 DIAGNOSIS — F4321 Adjustment disorder with depressed mood: Secondary | ICD-10-CM | POA: Diagnosis not present

## 2018-07-09 DIAGNOSIS — F419 Anxiety disorder, unspecified: Secondary | ICD-10-CM

## 2018-07-09 DIAGNOSIS — F32A Depression, unspecified: Secondary | ICD-10-CM

## 2018-07-09 DIAGNOSIS — F329 Major depressive disorder, single episode, unspecified: Secondary | ICD-10-CM

## 2018-07-09 DIAGNOSIS — R600 Localized edema: Secondary | ICD-10-CM

## 2018-07-09 DIAGNOSIS — I341 Nonrheumatic mitral (valve) prolapse: Secondary | ICD-10-CM | POA: Diagnosis not present

## 2018-07-09 MED ORDER — HYDROCHLOROTHIAZIDE 12.5 MG PO TABS: 12.5000 mg | ORAL_TABLET | Freq: Every day | ORAL | 3 refills | Status: DC | PRN

## 2018-07-09 MED ORDER — CLONAZEPAM 0.5 MG PO TABS: 0.5000 mg | ORAL_TABLET | Freq: Two times a day (BID) | ORAL | 0 refills | Status: DC | PRN

## 2018-07-09 NOTE — Patient Instructions (Signed)

## 2018-07-09 NOTE — Progress Notes (Signed)
Patient: Kathleen Perkins Female    DOB: 1961-06-25   57 y.o.   MRN: [id number] Visit Date: 07/09/2018  Today's Provider: Lavon Paganini, MD   I, Dhiya Clan, CMA, am acting as scribe for Lavon Paganini, MD.  Chief Complaint  Patient presents with  . Anxiety   Subjective:    Anxiety  Presents for initial visit. Episode onset: This has been a long term problem, but is exacerbating since her brother died yesterday. She states she has social anxiety, and is having to deal with many people during the funeral process. The problem has been gradually worsening. Symptoms include compulsions, depressed mood (situational), insomnia, irritability, malaise, muscle tension, nervous/anxious behavior, palpitations, panic and restlessness. Patient reports no chest pain, confusion, decreased concentration, dizziness, dry mouth, excessive worry, feeling of choking, hyperventilation, nausea, obsessions, shortness of breath or suicidal ideas. The symptoms are aggravated by family issues, specific phobias and social activities.   Past treatments include benzodiazephines and lifestyle changes (in the past she has used alprazolam). The treatment provided significant relief. Compliance with prior treatments has been good.  She has bipolar affective disorder and h/o anxiety and depression.  She is taking Lamictal.  She previously tried Klonopin after the death of another family member and it seemed to not give her as much euphoria as Xanax.  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  She is very conscious of benzos and does not want to take anything like this long term.  Patient also mentions that she previously took Maxzide prn for LE edema 2/2 MVP.  She has not had this in a few years.  She is having intermittent swelling.  She is wondering if this could be prescribed again.  Denies CP, SOB, or current LE edema.  She is followed by Dr Rockey Situ for her MVP.     Allergies  Allergen Reactions  .  Codeine Nausea Only  . Erythromycin Nausea Only     Current Outpatient Medications:  .  atenolol (TENORMIN) 25 MG tablet, TAKE 1 TABLET BY MOUTH TWICE DAILY AS NEEDED, Disp: 60 tablet, Rfl: 3 .  lamoTRIgine (LAMICTAL) 100 MG tablet, Take 100 mg by mouth 2 (two) times daily., Disp: , Rfl:  .  clonazePAM (KLONOPIN) 0.5 MG tablet, Take 1 tablet (0.5 mg total) by mouth 2 (two) times daily as needed for anxiety., Disp: 30 tablet, Rfl: 0 .  hydrochlorothiazide (HYDRODIURIL) 12.5 MG tablet, Take 1 tablet (12.5 mg total) by mouth daily as needed (lower extremity edema)., Disp: 30 tablet, Rfl: 3  Review of Systems  Constitutional: Positive for irritability.  Respiratory: Negative for shortness of breath.   Cardiovascular: Positive for palpitations. Negative for chest pain.  Gastrointestinal: Negative for nausea.  Neurological: Negative for dizziness.  Psychiatric/Behavioral: Negative for confusion, decreased concentration and suicidal ideas. The patient is nervous/anxious and has insomnia.     Social History   Tobacco Use  . Smoking status: Never Smoker  . Smokeless tobacco: Never Used  Substance Use Topics  . Alcohol use: No   Objective:   BP 116/74 (BP Location: Left Arm, Patient Position: Sitting, Cuff Size: Normal)   Pulse 72   Temp 97.8 F (36.6 C) (Oral)   Resp 16   Wt 176 lb (79.8 kg)   SpO2 99%   BMI 29.29 kg/m  Vitals:   07/09/18 1003  BP: 116/74  Pulse: 72  Resp: 16  Temp: 97.8 F (36.6 C)  TempSrc: Oral  SpO2:  99%  Weight: 176 lb (79.8 kg)     Physical Exam  Constitutional: She is oriented to person, place, and time. She appears well-developed and well-nourished. No distress.  HENT:  Head: Normocephalic and atraumatic.  Eyes: Conjunctivae are normal. No scleral icterus.  Neck: Neck supple. No thyromegaly present.  Cardiovascular: Normal rate, regular rhythm, normal heart sounds and intact distal pulses.  No murmur heard. Pulmonary/Chest: Effort normal and  breath sounds normal. No respiratory distress. She has no wheezes. She has no rales.  Musculoskeletal: She exhibits no edema or deformity.  Lymphadenopathy:    She has no cervical adenopathy.  Neurological: She is alert and oriented to person, place, and time.  Skin: Skin is warm and dry. Capillary refill takes less than 2 seconds. No rash noted.  Psychiatric: Her speech is normal and behavior is normal. Judgment normal. Her mood appears anxious. Cognition and memory are normal. She expresses no homicidal and no suicidal ideation. She expresses no suicidal plans and no homicidal plans.  Intermittently tearful  Vitals reviewed.   Depression screen PHQ 2/9 07/09/2018  Decreased Interest 0  Down, Depressed, Hopeless 1  PHQ - 2 Score 1  Altered sleeping 1  Tired, decreased energy 1  Change in appetite 1  Feeling bad or failure about yourself  1  Trouble concentrating 1  Moving slowly or fidgety/restless 0  Suicidal thoughts 0  PHQ-9 Score 6  Difficult doing work/chores Somewhat difficult     GAD 7 : Generalized Anxiety Score 07/09/2018  Nervous, Anxious, on Edge 2  Control/stop worrying 1  Worry too much - different things 1  Trouble relaxing 2  Restless 0  Easily annoyed or irritable 1  Afraid - awful might happen 2  Total GAD 7 Score 9  Anxiety Difficulty Somewhat difficult        Assessment & Plan:   1. Anxiety and depression 2. Grief reaction - patient grieving the death of her brother with acute anxiety, especially in the setting of being around a lot of family and going to [employer] and funeral -She should continue taking Lamictal for her bipolar affective disorder - She has not tolerated SSRIs or SNRIs well in the past - Agreed to small supply of Klonopin to use as needed in the setting of acute grief reaction and anxiety -Contracted for safety, no SI/HI -Discussed return precautions  3. Lower extremity edema 4. Mitral valve prolapse - none present  currently, but this is intermittent problem -Will give prescription for HCTZ 12.5 mg daily as needed to use when needed for lower extremity swelling -Also discussed leg elevation and compression stockings    Meds ordered this encounter  Medications  . clonazePAM (KLONOPIN) 0.5 MG tablet    Sig: Take 1 tablet (0.5 mg total) by mouth 2 (two) times daily as needed for anxiety.    Dispense:  30 tablet    Refill:  0  . hydrochlorothiazide (HYDRODIURIL) 12.5 MG tablet    Sig: Take 1 tablet (12.5 mg total) by mouth daily as needed (lower extremity edema).    Dispense:  30 tablet    Refill:  3     Return in about 4 months (around 11/08/2018) for CPE (after 11/28).   The entirety of the information documented in the History of Present Illness, Review of Systems and Physical Exam were personally obtained by me. Portions of this information were initially documented by Raquel Sarna Ratchford, CMA and reviewed by me for thoroughness and accuracy.    Shawanna Zanders,  Dionne Bucy, MD, MPH [facility] 07/09/2018 12:03 PM

## 2018-07-16 ENCOUNTER — Encounter: Payer: Self-pay | Admitting: Physician Assistant

## 2018-07-16 ENCOUNTER — Ambulatory Visit: Payer: BLUE CROSS/BLUE SHIELD | Admitting: Family Medicine

## 2018-07-16 ENCOUNTER — Ambulatory Visit (INDEPENDENT_AMBULATORY_CARE_PROVIDER_SITE_OTHER): Payer: BLUE CROSS/BLUE SHIELD | Admitting: Physician Assistant

## 2018-07-16 VITALS — BP 122/76 | HR 68 | Temp 98.1°F | Wt 179.0 lb

## 2018-07-16 DIAGNOSIS — K219 Gastro-esophageal reflux disease without esophagitis: Secondary | ICD-10-CM | POA: Diagnosis not present

## 2018-07-16 DIAGNOSIS — M654 Radial styloid tenosynovitis [de Quervain]: Secondary | ICD-10-CM | POA: Diagnosis not present

## 2018-07-16 MED ORDER — CELECOXIB 100 MG PO CAPS: 100.0000 mg | ORAL_CAPSULE | Freq: Two times a day (BID) | ORAL | 0 refills | Status: DC

## 2018-07-16 MED ORDER — RANITIDINE HCL 150 MG PO TABS: 150.0000 mg | ORAL_TABLET | Freq: Two times a day (BID) | ORAL | 0 refills | Status: DC

## 2018-07-16 NOTE — Progress Notes (Signed)
Patient: Kathleen Perkins Female    DOB: [birth year]   57 y.o.   MRN: [id number] Visit Date: 07/21/2018  Today's Provider: Trinna Post, PA-C   Chief Complaint  Patient presents with  . Wrist Pain    Started several months ago.  Pt reports a recent flare.    Subjective:    Wrist Pain   The pain is present in the left wrist. This is a chronic problem. There has been no history of extremity trauma. The problem occurs constantly. The problem has been gradually worsening. Associated symptoms include joint swelling. Pertinent negatives include no fever, itching, joint locking, limited range of motion, numbness, stiffness or tingling.   Pain in left wrist from picking up beagle freqeuntly.     Allergies  Allergen Reactions  . Codeine Nausea Only  . Erythromycin Nausea Only  . Ibuprofen Nausea Only     Current Outpatient Medications:  .  atenolol (TENORMIN) 25 MG tablet, TAKE 1 TABLET BY MOUTH TWICE DAILY AS NEEDED, Disp: 60 tablet, Rfl: 3 .  clonazePAM (KLONOPIN) 0.5 MG tablet, Take 1 tablet (0.5 mg total) by mouth 2 (two) times daily as needed for anxiety., Disp: 30 tablet, Rfl: 0 .  hydrochlorothiazide (HYDRODIURIL) 12.5 MG tablet, Take 1 tablet (12.5 mg total) by mouth daily as needed (lower extremity edema)., Disp: 30 tablet, Rfl: 3 .  lamoTRIgine (LAMICTAL) 100 MG tablet, Take 100 mg by mouth 2 (two) times daily., Disp: , Rfl:  .  celecoxib (CELEBREX) 100 MG capsule, Take 1 capsule (100 mg total) by mouth 2 (two) times daily for 14 days., Disp: 28 capsule, Rfl: 0 .  ranitidine (ZANTAC) 150 MG tablet, Take 1 tablet (150 mg total) by mouth 2 (two) times daily., Disp: 60 tablet, Rfl: 0  Review of Systems  Constitutional: Negative.  Negative for fever.  Musculoskeletal: Positive for arthralgias, joint swelling and myalgias. Negative for back pain, gait problem, neck pain, neck stiffness and stiffness.  Skin: Negative for itching.  Neurological: Negative for tingling,  weakness and numbness.    Social History   Tobacco Use  . Smoking status: Never Smoker  . Smokeless tobacco: Never Used  Substance Use Topics  . Alcohol use: No   Objective:   BP 122/76 (BP Location: Right Arm, Patient Position: Sitting, Cuff Size: Normal)   Pulse 68   Temp 98.1 F (36.7 C) (Oral)   Wt 179 lb (81.2 kg)   SpO2 97%   BMI 29.79 kg/m  Vitals:   07/16/18 1159  BP: 122/76  Pulse: 68  Temp: 98.1 F (36.7 C)  TempSrc: Oral  SpO2: 97%  Weight: 179 lb (81.2 kg)     Physical Exam  Constitutional: She is oriented to person, place, and time. She appears well-developed and well-nourished.  Cardiovascular: Normal rate.  Pulmonary/Chest: Effort normal.  Musculoskeletal:  Wynn Maudlin positive in left wrist.   Neurological: She is alert and oriented to person, place, and time.  Skin: Skin is warm and dry.  Psychiatric: She has a normal mood and affect. Her behavior is normal.        Assessment & Plan:     1. De Quervain's tenosynovitis  Thumb spica wrist splint. NSAIDs. May need ortho for cortisone shot.  - celecoxib (CELEBREX) 100 MG capsule; Take 1 capsule (100 mg total) by mouth 2 (two) times daily for 14 days.  Dispense: 28 capsule; Refill: 0  2. Gastroesophageal reflux disease, esophagitis presence not specified  - ranitidine (  ZANTAC) 150 MG tablet; Take 1 tablet (150 mg total) by mouth 2 (two) times daily.  Dispense: 60 tablet; Refill: 0  Return if symptoms worsen or fail to improve.  The entirety of the information documented in the History of Present Illness, Review of Systems and Physical Exam were personally obtained by me. Portions of this information were initially documented by Ashley Royalty, CMA and reviewed by me for thoroughness and accuracy.         Trinna Post, PA-C  [facility]

## 2018-07-16 NOTE — Patient Instructions (Addendum)
Thumb Spica Splint for DeQuervains       Tenosynovitis Tenosynovitis is inflammation of a tendon and the sleeve of tissue that covers the tendon (tendon sheath). A tendon is a cord of tissue that connects muscle to bone. Normally, a tendon slides smoothly inside its tendon sheath. Tenosynovitis limits movement of the tendon and surrounding tissues, which may cause pain and stiffness. Tenosynovitis can affect any tendon and tendon sheath. Commonly affected areas include tendons in the:  Shoulder.  Arm.  Hand.  Hip.  Leg.  Foot.  What are the causes? The main cause of this condition is wear and tear over time that results in slight tears in the tendon. Other possible causes include:  A sudden injury to the tendon or tendon sheath.  A disease that causes inflammation in the body.  An infection that spreads to the tendon and tendon sheath from a skin wound.  An infection in another part of the body that spreads to the tendon and tendon sheath through the blood.  What increases the risk? The following factors may make you more likely to develop this condition:  Having rheumatoid arthritis, gout, or diabetes.  Using IV drugs.  Doing physical activities that can cause tendon overuse and stress.  Having gonorrhea.  What are the signs or symptoms? Symptoms of this condition depend on the cause. Symptoms may include:  Pain with movement.  Pain and tenderness when pressing on the tendon and tendon sheath.  Swelling.  Stiffness.  If tenosynovitis is caused by an infection, symptoms may also include:  Fever.  Redness.  Warmth.  How is this diagnosed? This condition may be diagnosed based on your medical history and a physical exam. You also may have:  Blood tests.  Imaging tests, such as: ? MRI. ? Ultrasound.  A sample of fluid removed from inside the tendon sheath to be checked in a lab.  How is this treated? Treatment for this condition depends on the  cause. If tenosynovitis is not caused by an infection, treatment may include:  Resting the tendon.  Keeping the tendon in place for periods of time (immobilization) in a splint, brace, or sling.  NSAIDs to reduce pain and swelling.  A shot (injection) of medicine to help reduce pain and swelling (steroid).  Icing or applying heat to the affected area.  Physical therapy.  Surgery to release the tendon in the sheath or to repair damage to the tendon or tendon sheath. Surgery may be done if other treatments do not help relieve symptoms.  If tenosynovitis is caused by infection, treatment may include antibiotic medicine given through an IV. In some cases, surgery may be needed to drain fluid from the tendon sheath or to remove the tendon sheath. Follow these instructions at home: If you have a splint, brace, or sling:   Wear the splint, brace, or sling as told by your health care provider. Remove it only as told by your health care provider.  Loosen the splint, brace, or sling if your fingers or toes tingle, become numb, or turn cold and blue.  Do not let your splint or brace get wet if it is not waterproof. ? Do not take baths, swim, or use a hot tub until your health care provider approves. Ask your health care provider if you can take showers. ? If your splint, brace, or sling is not waterproof, cover it with a watertight covering when you take a bath or a shower.  Keep the splint, brace, or  sling clean. Managing pain, stiffness, and swelling  If directed, apply ice to the affected area. ? Put ice in a plastic bag. ? Place a towel between your skin and the bag. ? Leave the ice on for 20 minutes, 2-3 times a day.  Move the fingers or toes of the affected limb often, if this applies. This can help to prevent stiffness and lessen swelling.  If directed, raise (elevate) the affected area above the level of your heart while you are sitting or lying down.  If directed, apply heat to  the affected area as often as told by your health care provider. Use the heat source that your health care provider recommends, such as a moist heat pack or a heating pad. ? Place a towel between your skin and the heat source. ? Leave the heat on for 20-30 minutes. ? Remove the heat if your skin turns bright red. This is especially important if you are unable to feel pain, heat, or cold. You may have a greater risk of getting burned. Driving  Do not drive or operate heavy machinery while taking prescription pain medicine.  Ask your health care provider when it is safe to drive if you have a splint or brace on any part of your arm or leg. Activity  Return to your normal activities as told by your health care provider. Ask your health care provider what activities are safe for you.  Rest the affected area as told by your health care provider.  Avoid using the affected area while you are having symptoms of tenosynovitis.  If physical therapy was prescribed, do exercises as told by your health care provider. Safety  Do not use the injured limb to support your body weight until your health care provider says that you can. General instructions  Take over-the-counter and prescription medicines only as told by your health care provider.  Keep all follow-up visits as told by your health care provider. This is important. Contact a health care provider if:  Your symptoms are not improving or are getting worse.  You have a fever and more of any of the following symptoms: ? Pain. ? Redness. ? Warmth. ? Swelling. Get help right away if:  Your fingers or toes become numb or turn blue. This information is not intended to replace advice given to you by your health care provider. Make sure you discuss any questions you have with your health care provider. Document Released: 11/17/2005 Document Revised: 07/17/2016 Document Reviewed: 09/26/2015 Elsevier Interactive Patient Education  [employer].

## 2018-07-23 ENCOUNTER — Telehealth: Payer: Self-pay | Admitting: Family Medicine

## 2018-07-23 NOTE — Telephone Encounter (Signed)
Advised patient as below. She reports that she will hold off on ortho referral for now.

## 2018-07-23 NOTE — Telephone Encounter (Signed)
This medication is too strong and there is too high of a risk of GI bleed especially after a course of celebrex. Would continue with the celebrex, just signed note for a splint and if it's not getting better she needs to see ortho for a steroid injection.

## 2018-07-23 NOTE — Telephone Encounter (Signed)
Pt stated she has been taking celecoxib (CELEBREX) 100 MG capsule since 07/16/18 and it hasn't helped. Pt stated she has taking Toradol in the past and is requesting an Rx for Toradol be sent to [facility]. Please advise. Thanks TNP

## 2018-07-26 ENCOUNTER — Other Ambulatory Visit: Payer: Self-pay | Admitting: Physician Assistant

## 2018-07-26 DIAGNOSIS — M654 Radial styloid tenosynovitis [de Quervain]: Secondary | ICD-10-CM

## 2018-08-11 ENCOUNTER — Other Ambulatory Visit: Payer: Self-pay | Admitting: Physician Assistant

## 2018-08-11 DIAGNOSIS — K219 Gastro-esophageal reflux disease without esophagitis: Secondary | ICD-10-CM

## 2018-08-14 ENCOUNTER — Other Ambulatory Visit: Payer: Self-pay | Admitting: Physician Assistant

## 2018-08-14 DIAGNOSIS — M654 Radial styloid tenosynovitis [de Quervain]: Secondary | ICD-10-CM

## 2018-10-18 ENCOUNTER — Other Ambulatory Visit: Payer: Self-pay | Admitting: Obstetrics & Gynecology

## 2018-10-18 DIAGNOSIS — Z1231 Encounter for screening mammogram for malignant neoplasm of breast: Secondary | ICD-10-CM

## 2018-11-01 DIAGNOSIS — F419 Anxiety disorder, unspecified: Secondary | ICD-10-CM | POA: Diagnosis not present

## 2018-11-01 DIAGNOSIS — F3181 Bipolar II disorder: Secondary | ICD-10-CM | POA: Diagnosis not present

## 2018-11-06 ENCOUNTER — Other Ambulatory Visit: Payer: Self-pay | Admitting: Family Medicine

## 2018-11-06 DIAGNOSIS — M654 Radial styloid tenosynovitis [de Quervain]: Secondary | ICD-10-CM

## 2018-11-10 ENCOUNTER — Ambulatory Visit (INDEPENDENT_AMBULATORY_CARE_PROVIDER_SITE_OTHER): Payer: BLUE CROSS/BLUE SHIELD | Admitting: Family Medicine

## 2018-11-10 ENCOUNTER — Encounter: Payer: Self-pay | Admitting: Family Medicine

## 2018-11-10 VITALS — BP 125/89 | HR 57 | Temp 98.0°F | Resp 16 | Wt 177.0 lb

## 2018-11-10 DIAGNOSIS — M7551 Bursitis of right shoulder: Secondary | ICD-10-CM | POA: Diagnosis not present

## 2018-11-10 DIAGNOSIS — I499 Cardiac arrhythmia, unspecified: Secondary | ICD-10-CM

## 2018-11-10 DIAGNOSIS — I341 Nonrheumatic mitral (valve) prolapse: Secondary | ICD-10-CM

## 2018-11-10 DIAGNOSIS — F32A Depression, unspecified: Secondary | ICD-10-CM

## 2018-11-10 DIAGNOSIS — F419 Anxiety disorder, unspecified: Secondary | ICD-10-CM

## 2018-11-10 DIAGNOSIS — E663 Overweight: Secondary | ICD-10-CM

## 2018-11-10 DIAGNOSIS — F3172 Bipolar disorder, in full remission, most recent episode hypomanic: Secondary | ICD-10-CM

## 2018-11-10 DIAGNOSIS — Z1159 Encounter for screening for other viral diseases: Secondary | ICD-10-CM

## 2018-11-10 DIAGNOSIS — E538 Deficiency of other specified B group vitamins: Secondary | ICD-10-CM

## 2018-11-10 DIAGNOSIS — Z0000 Encounter for general adult medical examination without abnormal findings: Secondary | ICD-10-CM | POA: Diagnosis not present

## 2018-11-10 DIAGNOSIS — F329 Major depressive disorder, single episode, unspecified: Secondary | ICD-10-CM

## 2018-11-10 DIAGNOSIS — Z1211 Encounter for screening for malignant neoplasm of colon: Secondary | ICD-10-CM

## 2018-11-10 DIAGNOSIS — E785 Hyperlipidemia, unspecified: Secondary | ICD-10-CM

## 2018-11-10 DIAGNOSIS — M654 Radial styloid tenosynovitis [de Quervain]: Secondary | ICD-10-CM

## 2018-11-10 DIAGNOSIS — E559 Vitamin D deficiency, unspecified: Secondary | ICD-10-CM

## 2018-11-10 MED ORDER — KETOROLAC TROMETHAMINE 10 MG PO TABS: 10.0000 mg | ORAL_TABLET | Freq: Three times a day (TID) | ORAL | 0 refills | Status: AC | PRN

## 2018-11-10 MED ORDER — LAMOTRIGINE 100 MG PO TABS: 100.0000 mg | ORAL_TABLET | Freq: Two times a day (BID) | ORAL | 3 refills | Status: DC

## 2018-11-10 MED ORDER — ATENOLOL 25 MG PO TABS: 25.0000 mg | ORAL_TABLET | Freq: Two times a day (BID) | ORAL | 3 refills | Status: DC | PRN

## 2018-11-10 NOTE — Patient Instructions (Signed)
Preventive Care 40-64 Years, Female Preventive care refers to lifestyle choices and visits with your health care provider that can promote health and wellness. What does preventive care include?  A yearly physical exam. This is also called an annual well check.  Dental exams once or twice a year.  Routine eye exams. Ask your health care provider how often you should have your eyes checked.  Personal lifestyle choices, including: ? Daily care of your teeth and gums. ? Regular physical activity. ? Eating a healthy diet. ? Avoiding tobacco and drug use. ? Limiting alcohol use. ? Practicing safe sex. ? Taking low-dose aspirin daily starting at age 58. ? Taking vitamin and mineral supplements as recommended by your health care provider. What happens during an annual well check? The services and screenings done by your health care provider during your annual well check will depend on your age, overall health, lifestyle risk factors, and family history of disease. Counseling Your health care provider may ask you questions about your:  Alcohol use.  Tobacco use.  Drug use.  Emotional well-being.  Home and relationship well-being.  Sexual activity.  Eating habits.  Work and work Statistician.  Method of birth control.  Menstrual cycle.  Pregnancy history.  Screening You may have the following tests or measurements:  Height, weight, and BMI.  Blood pressure.  Lipid and cholesterol levels. These may be checked every 5 years, or more frequently if you are over 81 years old.  Skin check.  Lung cancer screening. You may have this screening every year starting at age 78 if you have a 30-pack-year history of smoking and currently smoke or have quit within the past 15 years.  Fecal occult blood test (FOBT) of the stool. You may have this test every year starting at age 65.  Flexible sigmoidoscopy or colonoscopy. You may have a sigmoidoscopy every 5 years or a colonoscopy  every 10 years starting at age 30.  Hepatitis C blood test.  Hepatitis B blood test.  Sexually transmitted disease (STD) testing.  Diabetes screening. This is done by checking your blood sugar (glucose) after you have not eaten for a while (fasting). You may have this done every 1-3 years.  Mammogram. This may be done every 1-2 years. Talk to your health care provider about when you should start having regular mammograms. This may depend on whether you have a family history of breast cancer.  BRCA-related cancer screening. This may be done if you have a family history of breast, ovarian, tubal, or peritoneal cancers.  Pelvic exam and Pap test. This may be done every 3 years starting at age 80. Starting at age 36, this may be done every 5 years if you have a Pap test in combination with an HPV test.  Bone density scan. This is done to screen for osteoporosis. You may have this scan if you are at high risk for osteoporosis.  Discuss your test results, treatment options, and if necessary, the need for more tests with your health care provider. Vaccines Your health care provider may recommend certain vaccines, such as:  Influenza vaccine. This is recommended every year.  Tetanus, diphtheria, and acellular pertussis (Tdap, Td) vaccine. You may need a Td booster every 10 years.  Varicella vaccine. You may need this if you have not been vaccinated.  Zoster vaccine. You may need this after age 5.  Measles, mumps, and rubella (MMR) vaccine. You may need at least one dose of MMR if you were born in  1957 or later. You may also need a second dose.  Pneumococcal 13-valent conjugate (PCV13) vaccine. You may need this if you have certain conditions and were not previously vaccinated.  Pneumococcal polysaccharide (PPSV23) vaccine. You may need one or two doses if you smoke cigarettes or if you have certain conditions.  Meningococcal vaccine. You may need this if you have certain  conditions.  Hepatitis A vaccine. You may need this if you have certain conditions or if you travel or work in places where you may be exposed to hepatitis A.  Hepatitis B vaccine. You may need this if you have certain conditions or if you travel or work in places where you may be exposed to hepatitis B.  Haemophilus influenzae type b (Hib) vaccine. You may need this if you have certain conditions.  Talk to your health care provider about which screenings and vaccines you need and how often you need them. This information is not intended to replace advice given to you by your health care provider. Make sure you discuss any questions you have with your health care provider. Document Released: 12/14/2015 Document Revised: 08/06/2016 Document Reviewed: 09/18/2015 Elsevier Interactive Patient Education  2018 Elsevier Inc.  

## 2018-11-10 NOTE — Progress Notes (Signed)
Patient: Kathleen Perkins, Female    DOB: Aug 26, 1961, 57 y.o.   MRN: [id number] Visit Date: 11/10/2018  Today's Provider: Lavon Paganini, MD   Chief Complaint  Patient presents with  . Annual Exam   Subjective:    Annual physical exam Kathleen Perkins is a 57 y.o. female who presents today for health maintenance and complete physical. She feels well. She reports exercising yes. She reports she is sleeping well.  Patient is concerned about R shoulder pain and L wrist Dequarvains tenosynovitis.  She is trying Tumeric and celebrex with minimal improvement. Did do some HEP from PT for Dequervains.  Pain with ulnar deviation of L wrist and overhead motions and internal rotation.  She was previously followed by Psych NP in [facility] for Bipolar affective disorder.  She has been stable on current dose of Lamictal for several years.  She wonders if we can manage her medications.  She is seeing a therapist regularly. Doing better with anxiety and depression.  Not using klonopin often.  She used it in the stress of the death of her brother. ---------------------------------------------------------------   Review of Systems  Musculoskeletal: Positive for arthralgias.  All other systems reviewed and are negative.   Social History      She  reports that she has never smoked. She has never used smokeless tobacco. She reports that she does not drink alcohol or use drugs.       Social History   Socioeconomic History  . Marital status: Divorced    Spouse name: Not on file  . Number of children: Not on file  . Years of education: Not on file  . Highest education level: Not on file  Occupational History  . Not on file  Social Needs  . Financial resource strain: Not on file  . Food insecurity:    Worry: Not on file    Inability: Not on file  . Transportation needs:    Medical: Not on file    Non-medical: Not on file  Tobacco Use  . Smoking status: Never Smoker  . Smokeless  tobacco: Never Used  Substance and Sexual Activity  . Alcohol use: No  . Drug use: No  . Sexual activity: Not on file  Lifestyle  . Physical activity:    Days per week: Not on file    Minutes per session: Not on file  . Stress: Not on file  Relationships  . Social connections:    Talks on phone: Not on file    Gets together: Not on file    Attends religious service: Not on file    Active member of club or organization: Not on file    Attends meetings of clubs or organizations: Not on file    Relationship status: Not on file  Other Topics Concern  . Not on file  Social History Narrative  . Not on file    Past Medical History:  Diagnosis Date  . Anxiety   . Melanoma of upper arm ([place])   . MVP (mitral valve prolapse)      Patient Active Problem List   Diagnosis Date Noted  . Chronic cervical pain 02/25/2017  . Melanoma ([place]) 02/25/2017  . Menopause 02/25/2017  . Mitral valve prolapse 02/25/2017  . Bipolar affective ([place]) 11/26/2016  . Anxiety and depression 06/01/2015  . Chest discomfort 06/01/2015  . Arrhythmia 06/01/2015  . Shortness of breath 06/01/2015  . Primary osteoarthritis of left knee 08/15/2014  Past Surgical History:  Procedure Laterality Date  . ARTHROSCOPIC REPAIR ACL    . BREAST BIOPSY Left 06/23/2013   u/s bx- neg  . BREAST EXCISIONAL BIOPSY Left 07/14/2013   us/bx-neg  . MELANOMA EXCISION     left arm.   . REPLACEMENT TOTAL KNEE     left     Family History        Family Status  Relation Name Status  . Mother  Deceased       lung cancer   . Father  Deceased  . Sister  Deceased       cancer   . Brother  Alive  . Neg Hx  (Not Specified)        Her family history includes Heart attack in her brother; Heart disease in her brother and father; Hyperlipidemia in her brother and father; Hypertension in her brother and father. There is no history of Breast cancer.      Allergies  Allergen Reactions  . Codeine Nausea Only  .  Erythromycin Nausea Only  . Ibuprofen Nausea Only     Current Outpatient Medications:  .  atenolol (TENORMIN) 25 MG tablet, TAKE 1 TABLET BY MOUTH TWICE DAILY AS NEEDED, Disp: 60 tablet, Rfl: 3 .  celecoxib (CELEBREX) 100 MG capsule, TAKE ONE CAPSULE BY MOUTH TWICE A DAY FOR 14 DAYS, Disp: 28 capsule, Rfl: 0 .  clonazePAM (KLONOPIN) 0.5 MG tablet, Take 1 tablet (0.5 mg total) by mouth 2 (two) times daily as needed for anxiety., Disp: 30 tablet, Rfl: 0 .  hydrochlorothiazide (HYDRODIURIL) 12.5 MG tablet, Take 1 tablet (12.5 mg total) by mouth daily as needed (lower extremity edema)., Disp: 30 tablet, Rfl: 3 .  lamoTRIgine (LAMICTAL) 100 MG tablet, Take 100 mg by mouth 2 (two) times daily., Disp: , Rfl:  .  ranitidine (ZANTAC) 150 MG tablet, TAKE ONE TABLET BY MOUTH TWICE A DAY, Disp: 60 tablet, Rfl: 5 .  TURMERIC PO, Take by mouth., Disp: , Rfl:    Patient Care Team: Virginia Crews, MD as PCP - General (Family Medicine)      Objective:   Vitals: BP 125/89 (BP Location: Left Arm, Patient Position: Sitting, Cuff Size: Large)   Pulse (!) 57   Temp 98 F (36.7 C) (Oral)   Resp 16   Wt 177 lb (80.3 kg)   SpO2 98%   BMI 29.45 kg/m    Vitals:   11/10/18 1524  BP: 125/89  Pulse: (!) 57  Resp: 16  Temp: 98 F (36.7 C)  TempSrc: Oral  SpO2: 98%  Weight: 177 lb (80.3 kg)     Physical Exam Vitals signs reviewed.  Constitutional:      General: She is not in acute distress.    Appearance: Normal appearance. She is well-developed. She is not diaphoretic.  HENT:     Head: Normocephalic and atraumatic.     Right Ear: Tympanic membrane, ear canal and external ear normal.     Left Ear: Tympanic membrane, ear canal and external ear normal.     Nose: Nose normal.     Mouth/Throat:     Mouth: Mucous membranes are moist.     Pharynx: Oropharynx is clear. No oropharyngeal exudate.  Eyes:     General: No scleral icterus.    Conjunctiva/sclera: Conjunctivae normal.     Pupils:  Pupils are equal, round, and reactive to light.  Neck:     Musculoskeletal: Neck supple.     Thyroid: No thyromegaly.  Cardiovascular:     Rate and Rhythm: Normal rate and regular rhythm.     Pulses: Normal pulses.     Heart sounds: Normal heart sounds. No murmur.  Pulmonary:     Effort: Pulmonary effort is normal. No respiratory distress.     Breath sounds: Normal breath sounds. No wheezing or rales.  Abdominal:     General: Bowel sounds are normal. There is no distension.     Palpations: Abdomen is soft.     Tenderness: There is no abdominal tenderness. There is no guarding or rebound.  Musculoskeletal:        General: No deformity.     Right lower leg: No edema.     Left lower leg: No edema.     Comments: R Shoulder: Inspection reveals no abnormalities, atrophy or asymmetry. Palpation is normal with no tenderness over AC joint or bicipital groove. ROM is full in all planes, but has pain with overhead abduction and internal roation Rotator cuff strength normal throughout. +signs of impingement with positive Neer and Hawkin's tests, empty can. Speeds and Yergason's tests normal. No labral pathology noted with negative Obrien's, negative clunk and good stability. Normal scapular function observed. No painful arc and no drop arm sign. No apprehension sign L Wrist: Inspection normal with no visible erythema or swelling. ROM smooth and normal with good flexion and extension and ulnar/radial deviation that is symmetrical with opposite wrist. Palpation is normal over metacarpals, navicular, lunate, and TFCC; tendons without tenderness/ swelling Strength 5/5 in all directions without pain. + Finkelstein, negative tinel's and phalens.   Lymphadenopathy:     Cervical: No cervical adenopathy.  Skin:    General: Skin is warm and dry.     Capillary Refill: Capillary refill takes less than 2 seconds.     Findings: No rash.  Neurological:     Mental Status: She is alert and oriented to  person, place, and time.  Psychiatric:        Mood and Affect: Mood normal.        Behavior: Behavior normal.        Thought Content: Thought content normal.      Depression Screen PHQ 2/9 Scores 11/10/2018 07/09/2018  PHQ - 2 Score 0 1  PHQ- 9 Score 0 6    Assessment & Plan:     Routine Health Maintenance and Physical Exam  Exercise Activities and Dietary recommendations Goals   None      There is no immunization history on file for this patient.  Health Maintenance  Topic Date Due  . Hepatitis C Screening  [birth year]  . HIV Screening  10/29/1976  . TETANUS/TDAP  10/29/1980  . COLONOSCOPY  10/30/2011  . PAP SMEAR  05/31/2018  . INFLUENZA VACCINE  07/01/2018  . MAMMOGRAM  11/26/2019     Discussed health benefits of physical activity, and encouraged her to engage in regular exercise appropriate for her age and condition.    --------------------------------------------------------------------  Problem List Items Addressed This Visit      Cardiovascular and Mediastinum   Arrhythmia    PACs and PVCs Seem to be exacerbated by stress Continue atenolol BID prn      Relevant Medications   atenolol (TENORMIN) 25 MG tablet   Mitral valve prolapse   Relevant Medications   atenolol (TENORMIN) 25 MG tablet     Musculoskeletal and Integument   Acute bursitis of right shoulder    History and exam c/w bursitis with impingement symptoms Discussed NSAIDs, ice,  rest, HEP Can return for visit for consideration of subacromial injection      De Quervain's tenosynovitis, left    Exam consistent with DeQuarvains Discussed NSAIDs, ice, rest, brace, HEP Can use toradol sparingly Return precautions discussed        Other   Anxiety and depression    Symptoms improved Reaction to stress of the death of her brother in the past      Bipolar affective ([place])    Stable and well controlled Ok with Rx ing Lamictal Continue therapy      Hyperlipidemia    Recheck FLP  and CMP Not on statin currently      Relevant Medications   atenolol (TENORMIN) 25 MG tablet   Other Relevant Orders   Comprehensive metabolic panel   Lipid panel   B12 deficiency    Recheck levels      Relevant Orders   B12   Avitaminosis D    Recheck levels      Relevant Orders   VITAMIN D 25 Hydroxy (Vit-D Deficiency, Fractures)   Overweight    Discussed diet and exercise      Relevant Orders   CBC w/Diff/Platelet   Comprehensive metabolic panel   Lipid panel   TSH    Other Visit Diagnoses    Encounter for annual physical exam    -  Primary   Relevant Orders   CBC w/Diff/Platelet   Comprehensive metabolic panel   Lipid panel   TSH   Need for hepatitis C screening test       Relevant Orders   Hepatitis C Antibody   Screen for colon cancer       Relevant Orders   Ambulatory referral to Gastroenterology       Return in about 1 year (around 11/11/2019) for CPE.   The entirety of the information documented in the History of Present Illness, Review of Systems and Physical Exam were personally obtained by me. Portions of this information were initially documented by April Miller, CMA and reviewed by me for thoroughness and accuracy.    Virginia Crews, MD, MPH [facility] 11/12/2018 1:15 PM

## 2018-11-12 DIAGNOSIS — E663 Overweight: Secondary | ICD-10-CM | POA: Insufficient documentation

## 2018-11-12 DIAGNOSIS — E559 Vitamin D deficiency, unspecified: Secondary | ICD-10-CM | POA: Insufficient documentation

## 2018-11-12 DIAGNOSIS — E785 Hyperlipidemia, unspecified: Secondary | ICD-10-CM | POA: Insufficient documentation

## 2018-11-12 DIAGNOSIS — M7551 Bursitis of right shoulder: Secondary | ICD-10-CM | POA: Insufficient documentation

## 2018-11-12 DIAGNOSIS — E538 Deficiency of other specified B group vitamins: Secondary | ICD-10-CM | POA: Insufficient documentation

## 2018-11-12 DIAGNOSIS — M654 Radial styloid tenosynovitis [de Quervain]: Secondary | ICD-10-CM | POA: Insufficient documentation

## 2018-11-12 NOTE — Assessment & Plan Note (Signed)
Recheck levels 

## 2018-11-12 NOTE — Assessment & Plan Note (Signed)
Recheck FLP and CMP Not on statin currently

## 2018-11-12 NOTE — Assessment & Plan Note (Signed)
Exam consistent with DeQuarvains Discussed NSAIDs, ice, rest, brace, HEP Can use toradol sparingly Return precautions discussed

## 2018-11-12 NOTE — Assessment & Plan Note (Signed)
PACs and PVCs Seem to be exacerbated by stress Continue atenolol BID prn

## 2018-11-12 NOTE — Assessment & Plan Note (Signed)
Discussed diet and exercise 

## 2018-11-12 NOTE — Assessment & Plan Note (Signed)
Stable and well controlled Ok with Rx ing Lamictal Continue therapy

## 2018-11-12 NOTE — Assessment & Plan Note (Signed)
Symptoms improved Reaction to stress of the death of her brother in the past

## 2018-11-12 NOTE — Assessment & Plan Note (Signed)
History and exam c/w bursitis with impingement symptoms Discussed NSAIDs, ice, rest, HEP Can return for visit for consideration of subacromial injection

## 2018-11-15 ENCOUNTER — Encounter: Payer: Self-pay | Admitting: *Deleted

## 2019-04-05 ENCOUNTER — Other Ambulatory Visit: Payer: Self-pay

## 2019-04-05 ENCOUNTER — Other Ambulatory Visit: Payer: Self-pay | Admitting: Family Medicine

## 2019-04-05 DIAGNOSIS — F418 Other specified anxiety disorders: Secondary | ICD-10-CM

## 2019-04-05 MED ORDER — CLONAZEPAM 0.5 MG PO TABS: 0.5000 mg | ORAL_TABLET | Freq: Two times a day (BID) | ORAL | 0 refills | Status: DC | PRN

## 2019-04-05 NOTE — Telephone Encounter (Signed)
Patient requesting a refill. She says she has been more anxious lately and would like a refill on this. 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 This is Dr. Sharmaine Base patient. She would like refill sent in today if possible.

## 2019-04-05 NOTE — Telephone Encounter (Signed)
Sent in #30 with 0 refills.   Dr. Nathanial Rancher only

## 2019-07-12 ENCOUNTER — Other Ambulatory Visit: Payer: Self-pay | Admitting: Family Medicine

## 2019-07-12 DIAGNOSIS — F418 Other specified anxiety disorders: Secondary | ICD-10-CM

## 2019-07-12 MED ORDER — CLONAZEPAM 0.5 MG PO TABS: 0.5000 mg | ORAL_TABLET | Freq: Two times a day (BID) | ORAL | 0 refills | Status: DC | PRN

## 2019-07-12 NOTE — Telephone Encounter (Signed)
Pt calling back to say she is really needing the refill on the clonazePAM (KLONOPIN) 0.5 MG tablet today incase she doesn't have insurance do to loosing her job today.  Thanks, [employer]

## 2019-07-12 NOTE — Telephone Encounter (Signed)
Please call pt back to let her know if this can be done.

## 2019-07-12 NOTE — Telephone Encounter (Signed)
Refilled. She has refills for one year for her other medications. Any future refills should be at discretion of her PCP.

## 2019-07-12 NOTE — Addendum Note (Signed)
Addended by: Trinna Post on: 07/12/2019 05:22 PM   Modules accepted: Orders

## 2019-07-12 NOTE — Addendum Note (Signed)
Addended by: Julieta Bellini on: 07/12/2019 03:53 PM   Modules accepted: Orders

## 2019-07-12 NOTE — Telephone Encounter (Signed)
Pt is needing all her medications called in and anxiety medication called in TODAY. Pt lost her job today and will not have coverage to have the medications filled.  Please call them into:   [facility] [place], [place] [phone] (Phone) [phone] (Fax)   Thanks, [place]

## 2019-08-03 ENCOUNTER — Telehealth: Payer: Self-pay | Admitting: Family Medicine

## 2019-08-03 ENCOUNTER — Encounter: Payer: Self-pay | Admitting: Family Medicine

## 2019-08-03 NOTE — Telephone Encounter (Signed)
Returned patient call and she would like a letter just stating she had her CPE done on 11/10/2018 for her new job.Patient is going to come pick up letter.Please advise.

## 2019-08-03 NOTE — Telephone Encounter (Signed)
CORRECTION:  Please mail to pt ASAP.  Thanks, [employer]

## 2019-08-03 NOTE — Telephone Encounter (Signed)
Pt calling for "proof of most recent CPE" to give to her new employer.  Please call pt back to let her know when this is ready for pickup.  Thanks, [employer]

## 2019-08-03 NOTE — Telephone Encounter (Signed)
Letter signed and can be picked up.

## 2019-08-03 NOTE — Telephone Encounter (Signed)
Patient was advised via vm that her letter was ready for pick up.

## 2019-09-09 ENCOUNTER — Ambulatory Visit (INDEPENDENT_AMBULATORY_CARE_PROVIDER_SITE_OTHER): Payer: Self-pay | Admitting: Family Medicine

## 2019-09-09 ENCOUNTER — Encounter: Payer: Self-pay | Admitting: Family Medicine

## 2019-09-09 ENCOUNTER — Other Ambulatory Visit: Payer: Self-pay

## 2019-09-09 VITALS — BP 178/89 | HR 67 | Temp 96.8°F | Wt 170.8 lb

## 2019-09-09 DIAGNOSIS — S90122A Contusion of left lesser toe(s) without damage to nail, initial encounter: Secondary | ICD-10-CM

## 2019-09-09 NOTE — Patient Instructions (Signed)
Toe Fracture  A toe fracture is a break in one of the toe bones (phalanges). A toe fracture may be:  · A crack in the surface of the bone (stress fracture). This often occurs in athletes.  · A break all the way through the bone (complete fracture).  What are the causes?  This condition may be caused by:  · Direct impact, such as from dropping a heavy object on your toe.  · Stubbing your toe.  · Twisting or stretching your toe out of place.  · Overuse or repetitive exercise.  What increases the risk?  You are more likely to develop this condition if you:  · Play contact sports.  · Have a condition that causes the bones to become thin and brittle (osteoporosis).  · Have a low calcium level.  What are the signs or symptoms?  The main symptoms of this condition are swelling and pain in the toe. Other symptoms include:  · Bruising.  · Stiffness.  · Numbness.  · A change in the way the toe looks.  · Broken bones that poke through the skin.  · Blood beneath the toenail.  How is this diagnosed?  This condition is diagnosed with a physical exam. You may also have X-rays.  How is this treated?  Treatment for this condition depends on the type of fracture and its severity. Treatment may include:  · Taping the broken toe to a toe that is next to it (buddy taping). This is the most common treatment for fractures in which the bone has not moved out of place (non-displaced fracture).  · Wearing a shoe that has a wide, rigid sole to protect the toe and to limit its movement.  · Wearing a walking cast.  · Having a procedure to move the toe back into place.  · Surgery. This may be needed if the:  ? Pieces of broken bone are out of place (displaced).  ? Bone breaks through the skin.  · Physical therapy. This is done to help regain movement and strength in the toe.  You may need follow-up X-rays to make sure that the bone is healing well and staying in position.  Follow these instructions at home:  If you have a shoe:  · Wear the shoe  as told by your health care provider. Remove it only as told by your health care provider.  · Loosen the shoe if your toes tingle, become numb, or turn cold and blue.  · Keep the shoe clean and dry.  If you have a cast:  · Do not put pressure on any part of the cast until it is fully hardened. This may take several hours.  · Do not stick anything inside the cast to scratch your skin. Doing that increases your risk of infection.  · Check the skin around the cast every day. Tell your health care provider about any concerns.  · You may put lotion on dry skin around the edges of the cast. Do not put lotion on the skin underneath the cast.  · Keep the cast clean and dry.  Bathing  · Do not take baths, swim, or use a hot tub until your health care provider approves. Ask your health care provider if you can take showers.  · If the shoe or cast is not waterproof:  ? Do not let it get wet.  ? Cover it with a watertight covering when you take a bath or a shower.  Activity  ·   directed. Driving  Do not drive or use heavy machinery while taking pain medicine.  Do not drive while wearing a cast on a foot that you use for driving. Managing pain, stiffness, and swelling   If directed, put ice on painful areas: ? Put ice in a plastic bag. ? Place a towel between your skin and the bag.  If you have a shoe, remove it as told by your health care provider.  If you have a cast, place a towel between your cast and the bag. ? Leave the ice on for 20 minutes, 2-3 times per day.  Raise (elevate) the injured area above the level of your heart while you are sitting or lying down. General instructions  If your toe was treated with  buddy taping, follow your health care provider's instructions for changing the gauze and tape. Change it more often if: ? The gauze and tape get wet. If this happens, dry the space between the toes. ? The gauze and tape are too tight and cause your toe to become pale or numb.  If you were not given a protective shoe, wear sturdy, supportive shoes. Your shoes should not pinch your toes and should not fit tightly against your toes.  Do not use any products that contain nicotine or tobacco, such as cigarettes and e-cigarettes. These can delay bone healing. If you need help quitting, ask your health care provider.  Take over-the-counter and prescription medicines only as told by your health care provider.  Keep all follow-up visits as told by your health care provider. This is important. Contact a health care provider if you have:  Pain that gets worse or does not get better with medicine.  A fever.  A bad smell coming from your cast. Get help right away if you have:  Any of the following in your toes or your foot: ? Numbness that gets worse. ? Tingling. ? Coldness. ? Blue skin.  Redness or swelling that gets worse.  Pain that suddenly becomes severe. Summary  A toe fracture is a break in one of the toe bones (phalanges).  Treatment depends on how severe your fracture is and how the pieces of the broken bone line up with each other. Treatment may include buddy taping, wearing a shoe or a cast, or using crutches.  Ice and elevate your foot to help lessen the pain and swelling. This information is not intended to replace advice given to you by your health care provider. Make sure you discuss any questions you have with your health care provider. Document Released: 11/14/2000 Document Revised: 03/02/2018 Document Reviewed: 12/29/2017 Elsevier Patient Education  2020 Elsevier Inc.  

## 2019-09-09 NOTE — Progress Notes (Signed)
Patient: Kathleen Perkins Female    DOB: 06-17-1961   58 y.o.   MRN: [id number] Visit Date: 09/09/2019  Today's Provider: Lavon Paganini, MD   Chief Complaint  Patient presents with  . Toe Pain   Subjective:    I, Porsha McClurkin CMA, am acting as a scribe for Lavon Paganini, MD.   Toe Pain  The incident occurred 5 to 7 days ago. The incident occurred at home. The injury mechanism was a direct blow. The pain is present in the left toes. The pain is at a severity of 3/10 (Earlier in the week a 8). The pain is mild. The pain has been fluctuating since onset. Associated symptoms include an inability to bear weight. She reports no foreign bodies present. The symptoms are aggravated by movement and weight bearing. She has tried non-weight bearing and ice for the symptoms. The treatment provided mild relief.   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 Codeine Nausea Only  . Erythromycin Nausea Only  . Ibuprofen Nausea Only     Current Outpatient Medications:  .  atenolol (TENORMIN) 25 MG tablet, Take 1 tablet (25 mg total) by mouth 2 (two) times daily as needed., Disp: 180 tablet, Rfl: 3 .  celecoxib (CELEBREX) 100 MG capsule, TAKE ONE CAPSULE BY MOUTH TWICE A DAY FOR 14 DAYS, Disp: 28 capsule, Rfl: 0 .  clonazePAM (KLONOPIN) 0.5 MG tablet, Take 1 tablet (0.5 mg total) by mouth 2 (two) times daily as needed for anxiety., Disp: 30 tablet, Rfl: 0 .  hydrochlorothiazide (HYDRODIURIL) 12.5 MG tablet, Take 1 tablet (12.5 mg total) by mouth daily as needed (lower extremity edema)., Disp: 30 tablet, Rfl: 3 .  lamoTRIgine (LAMICTAL) 100 MG tablet, Take 1 tablet (100 mg total) by mouth 2 (two) times daily., Disp: 180 tablet, Rfl: 3 .  ranitidine (ZANTAC) 150 MG tablet, TAKE ONE TABLET BY MOUTH TWICE A DAY, Disp: 60 tablet, Rfl:  5 .  TURMERIC PO, Take by mouth., Disp: , Rfl:   Review of Systems  Constitutional: Negative.   Respiratory: Negative.   Genitourinary: Negative.   Neurological: Negative.     Social History   Tobacco Use  . Smoking status: Never Smoker  . Smokeless tobacco: Never Used  Substance Use Topics  . Alcohol use: No      Objective:   BP (!) 178/89 (BP Location: Left Arm, Patient Position: Sitting, Cuff Size: Normal)   Pulse 67   Temp (!) 96.8 F (36 C) (Temporal)   Wt 170 lb 12.8 oz (77.5 kg)   SpO2 99%   BMI 28.42 kg/m  Vitals:   09/09/19 1447  BP: (!) 178/89  Pulse: 67  Temp: (!) 96.8 F (36 C)  TempSrc: Temporal  SpO2: 99%  Weight: 170 lb 12.8 oz (77.5 kg)  Body mass index is 28.42 kg/m.   Physical Exam Vitals signs reviewed.  Constitutional:      General: She is not in acute distress.    Appearance: Normal appearance.  HENT:     Head: Normocephalic and atraumatic.  Cardiovascular:     Rate and Rhythm: Normal rate and regular rhythm.  Pulmonary:     Effort: Pulmonary effort is normal. No respiratory distress.  Musculoskeletal:     Comments: Swelling, TTP, and bruising of distal  L 3rd-5th toes.  No TTP at metatarsal heads or base of 5th metatarsal  Skin:    General: Skin is warm and dry.     Findings: Bruising present. No rash.  Neurological:     Mental Status: She is alert and oriented to person, place, and time. Mental status is at baseline.     Sensory: No sensory deficit.     Motor: No weakness.  Psychiatric:        Mood and Affect: Mood normal.        Behavior: Behavior normal.      No results found for any visits on 09/09/19.     Assessment & Plan   1. Contusion of lesser toe of left foot without damage to nail, initial encounter - new problem - discussed that distal toe fractures was possible vs bone bruising - will hold off on XRay at this time as patient is uninsured - work note given to return to work on 10/13 - Rx given for post-op  shoe that may be more comfortable to wear back to work than sneaker - continue tylenol prn - return precautions discussed   Return if symptoms worsen or fail to improve.   The entirety of the information documented in the History of Present Illness, Review of Systems and Physical Exam were personally obtained by me. Portions of this information were initially documented by [facility], CMA and reviewed by me for thoroughness and accuracy.    [place], Dionne Bucy, MD MPH [facility]

## 2019-09-23 ENCOUNTER — Other Ambulatory Visit: Payer: Self-pay | Admitting: Family Medicine

## 2019-09-30 ENCOUNTER — Other Ambulatory Visit: Payer: Self-pay | Admitting: Family Medicine

## 2019-09-30 ENCOUNTER — Other Ambulatory Visit: Payer: Self-pay | Admitting: Physician Assistant

## 2019-09-30 DIAGNOSIS — I499 Cardiac arrhythmia, unspecified: Secondary | ICD-10-CM

## 2019-09-30 DIAGNOSIS — F418 Other specified anxiety disorders: Secondary | ICD-10-CM

## 2019-09-30 MED ORDER — HYDROCHLOROTHIAZIDE 12.5 MG PO TABS: ORAL_TABLET | ORAL | 2 refills | Status: DC

## 2019-09-30 MED ORDER — ATENOLOL 25 MG PO TABS: 25.0000 mg | ORAL_TABLET | Freq: Two times a day (BID) | ORAL | 3 refills | Status: DC | PRN

## 2019-09-30 MED ORDER — CLONAZEPAM 0.5 MG PO TABS: 0.5000 mg | ORAL_TABLET | Freq: Two times a day (BID) | ORAL | 1 refills | Status: DC | PRN

## 2019-09-30 NOTE — Telephone Encounter (Signed)
Pt needs refills on  Atenolol 25mg   Klonopin 0.25  hctz 12.5  [facility]  [place]

## 2019-11-09 ENCOUNTER — Other Ambulatory Visit: Payer: Self-pay | Admitting: Family Medicine

## 2019-11-09 ENCOUNTER — Telehealth: Payer: Self-pay | Admitting: Family Medicine

## 2019-11-09 DIAGNOSIS — F418 Other specified anxiety disorders: Secondary | ICD-10-CM

## 2019-11-09 NOTE — Telephone Encounter (Signed)
OK. We will watch for refills and be able to fill them in the meantime.

## 2019-11-09 NOTE — Telephone Encounter (Signed)
Medication Refill - Medication: clonazePAM (KLONOPIN) 0.5 MG tablet  Has the patient contacted their pharmacy? Yes.   (Agent: If no, request that the patient contact the pharmacy for the refill.) (Agent: If yes, when and what did the pharmacy advise?)  Preferred Pharmacy (with phone number or street name): [place], [place]  Agent: Please be advised that RX refills may take up to 3 business days. We ask that you follow-up with your pharmacy.

## 2019-11-09 NOTE — Telephone Encounter (Signed)
Requested medication (s) are due for refill today: yes  Requested medication (s) are on the active medication list: yes  Last refill:  09/30/2019  Future visit scheduled: no  Notes to clinic:  Refill cannot be delegated    Requested Prescriptions  Pending Prescriptions Disp Refills   clonazePAM (KLONOPIN) 0.5 MG tablet 30 tablet 1    Sig: Take 1 tablet (0.5 mg total) by mouth 2 (two) times daily as needed for anxiety.     Not Delegated - Psychiatry:  Anxiolytics/Hypnotics Failed - 11/09/2019 12:10 PM      Failed - This refill cannot be delegated      Failed - Urine Drug Screen completed in last 360 days.      Passed - Valid encounter within last 6 months    Recent Outpatient Visits          2 months ago Contusion of lesser toe of left foot without damage to nail, initial encounter   [facility] [place], Dionne Bucy, MD   12 months ago Encounter for annual physical exam   [facility] [place], Dionne Bucy, MD   1 year ago [place], [place], [place]   1 year ago Anxiety and depression   [facility] [place], Dionne Bucy, MD   2 years ago [place], Vickki Muff, [place]

## 2019-11-09 NOTE — Telephone Encounter (Signed)
Patient called and just wanted to let Dr. Brita Romp know that she has lost her job but doesn't start her knew one until 11/14/19 but her insurance won't kick in until March. When pharmacy calls in she would like for Dr. B to please send in medication. She has an appt on 02/17/19 for her CPE. If they are any question about what is going on, patient said she could give her a call back, thanks.

## 2019-11-10 MED ORDER — CLONAZEPAM 0.5 MG PO TABS: 0.5000 mg | ORAL_TABLET | Freq: Two times a day (BID) | ORAL | 1 refills | Status: DC | PRN

## 2019-12-27 ENCOUNTER — Other Ambulatory Visit: Payer: Self-pay | Admitting: Family Medicine

## 2019-12-27 NOTE — Telephone Encounter (Signed)
Requested medication (s) are due for refill today: yes  Requested medication (s) are on the active medication list: yes  Last refill:  01/11/18 #180 with 3 refills  Future visit scheduled: yes  Notes to clinic:  Refill not delegated protocol.    Requested Prescriptions  Pending Prescriptions Disp Refills   lamoTRIgine (LAMICTAL) 100 MG tablet [Pharmacy Med Name: lamoTRIgine 100 MG TABLET] 180 tablet 2    Sig: TAKE ONE TABLET BY MOUTH TWICE A DAY      Not Delegated - Neurology:  Anticonvulsants Failed - 12/27/2019  4:28 PM      Failed - This refill cannot be delegated      Failed - HCT in normal range and within 360 days    No results found for: HCT, HCTKUC, SRHCT        Failed - HGB in normal range and within 360 days    No results found for: HGB, HGBKUC, HGBPOCKUC, HGBOTHER, TOTHGB, HGBPLASMA        Failed - PLT in normal range and within 360 days    No results found for: PLT, PLTCOUNTKUC, LABPLAT, POCPLA        Failed - WBC in normal range and within 360 days    No results found for: WBC, WBCKUC        Passed - Valid encounter within last 12 months    Recent Outpatient Visits           3 months ago Contusion of lesser toe of left foot without damage to nail, initial encounter   [facility] [place], Dionne Bucy, MD   1 year ago Encounter for annual physical exam   [facility] [place], Dionne Bucy, MD   1 year ago [place] Quervain's tenosynovitis   [facility] [place], [place], [place]   1 year ago Anxiety and depression   [facility] [place], Dionne Bucy, MD   2 years ago [place], Vickki Muff, [place]

## 2020-01-25 ENCOUNTER — Other Ambulatory Visit: Payer: Self-pay | Admitting: Family Medicine

## 2020-01-25 DIAGNOSIS — F418 Other specified anxiety disorders: Secondary | ICD-10-CM

## 2020-01-25 NOTE — Telephone Encounter (Signed)
Medication Refill - Medication: clonazePAM (KLONOPIN) 0.5 MG tablet   Preferred Pharmacy (with phone number or street name):  [place], [place] Phone:  [phone]  Fax:  [phone]       Agent: Please be advised that RX refills may take up to 3 business days. We ask that you follow-up with your pharmacy.

## 2020-01-25 NOTE — Telephone Encounter (Signed)
Refill request for clonazePAM (KLONOPIN) 0.5 MG tablet. Med can not be delegated per protocol.

## 2020-01-26 MED ORDER — CLONAZEPAM 0.5 MG PO TABS: 0.5000 mg | ORAL_TABLET | Freq: Two times a day (BID) | ORAL | 0 refills | Status: DC | PRN

## 2020-02-17 ENCOUNTER — Ambulatory Visit (INDEPENDENT_AMBULATORY_CARE_PROVIDER_SITE_OTHER): Payer: BC Managed Care – PPO | Admitting: Family Medicine

## 2020-02-17 ENCOUNTER — Encounter: Payer: Self-pay | Admitting: Family Medicine

## 2020-02-17 ENCOUNTER — Other Ambulatory Visit: Payer: Self-pay

## 2020-02-17 VITALS — BP 151/88 | HR 60 | Temp 97.1°F | Wt 175.0 lb

## 2020-02-17 DIAGNOSIS — Z85820 Personal history of malignant melanoma of skin: Secondary | ICD-10-CM

## 2020-02-17 DIAGNOSIS — F3132 Bipolar disorder, current episode depressed, moderate: Secondary | ICD-10-CM | POA: Diagnosis not present

## 2020-02-17 DIAGNOSIS — Z0000 Encounter for general adult medical examination without abnormal findings: Secondary | ICD-10-CM

## 2020-02-17 DIAGNOSIS — Z1231 Encounter for screening mammogram for malignant neoplasm of breast: Secondary | ICD-10-CM

## 2020-02-17 DIAGNOSIS — F329 Major depressive disorder, single episode, unspecified: Secondary | ICD-10-CM

## 2020-02-17 DIAGNOSIS — Z79899 Other long term (current) drug therapy: Secondary | ICD-10-CM

## 2020-02-17 DIAGNOSIS — Z23 Encounter for immunization: Secondary | ICD-10-CM

## 2020-02-17 DIAGNOSIS — I341 Nonrheumatic mitral (valve) prolapse: Secondary | ICD-10-CM

## 2020-02-17 DIAGNOSIS — F32A Depression, unspecified: Secondary | ICD-10-CM

## 2020-02-17 DIAGNOSIS — Z114 Encounter for screening for human immunodeficiency virus [HIV]: Secondary | ICD-10-CM

## 2020-02-17 DIAGNOSIS — I499 Cardiac arrhythmia, unspecified: Secondary | ICD-10-CM | POA: Diagnosis not present

## 2020-02-17 DIAGNOSIS — E538 Deficiency of other specified B group vitamins: Secondary | ICD-10-CM

## 2020-02-17 DIAGNOSIS — Z1211 Encounter for screening for malignant neoplasm of colon: Secondary | ICD-10-CM

## 2020-02-17 DIAGNOSIS — F419 Anxiety disorder, unspecified: Secondary | ICD-10-CM

## 2020-02-17 DIAGNOSIS — E559 Vitamin D deficiency, unspecified: Secondary | ICD-10-CM

## 2020-02-17 DIAGNOSIS — Z1159 Encounter for screening for other viral diseases: Secondary | ICD-10-CM

## 2020-02-17 DIAGNOSIS — E782 Mixed hyperlipidemia: Secondary | ICD-10-CM

## 2020-02-17 DIAGNOSIS — E663 Overweight: Secondary | ICD-10-CM

## 2020-02-17 MED ORDER — TRAZODONE HCL 100 MG PO TABS: 100.0000 mg | ORAL_TABLET | Freq: Every evening | ORAL | 3 refills | Status: DC | PRN

## 2020-02-17 MED ORDER — ATENOLOL 25 MG PO TABS: 25.0000 mg | ORAL_TABLET | Freq: Two times a day (BID) | ORAL | 3 refills | Status: DC | PRN

## 2020-02-17 MED ORDER — HYDROXYZINE HCL 10 MG PO TABS: 10.0000 mg | ORAL_TABLET | Freq: Three times a day (TID) | ORAL | 0 refills | Status: DC | PRN

## 2020-02-17 MED ORDER — LAMOTRIGINE 100 MG PO TABS: 100.0000 mg | ORAL_TABLET | Freq: Two times a day (BID) | ORAL | 3 refills | Status: DC

## 2020-02-17 NOTE — Assessment & Plan Note (Signed)
Previously evaluated by cardiology PACs and PVCs Seems to be exacerbated by stress and anxiety Continue atenolol 25 mg daily with a second dose as needed during the day for breakthrough

## 2020-02-17 NOTE — Assessment & Plan Note (Signed)
Chronic and intermittent with recent worsening This is part of her mood disorder This is treated with her Lamictal She cannot tolerate SSRIs due to history of mania as well as history of serotonin syndrome

## 2020-02-17 NOTE — Assessment & Plan Note (Signed)
Chronic Recent exacerbation Continue Lamictal at current dose Add trazodone for sleep May use hydroxyzine as needed instead of clonazepam She does still fill clonazepam a few times a year to use sparingly She does seem safe to herself and others I do believe that she can return to work as she is not currently manic

## 2020-02-17 NOTE — Assessment & Plan Note (Signed)
Recheck level 

## 2020-02-17 NOTE — Patient Instructions (Signed)
 The CDC recommends two doses of Shingrix (the shingles vaccine) separated by 2 to 6 months for adults age 59 years and older. I recommend checking with your insurance plan regarding coverage for this vaccine.      Preventive Care 40-64 Years Old, Female Preventive care refers to visits with your health care provider and lifestyle choices that can promote health and wellness. This includes:  A yearly physical exam. This may also be called an annual well check.  Regular dental visits and eye exams.  Immunizations.  Screening for certain conditions.  Healthy lifestyle choices, such as eating a healthy diet, getting regular exercise, not using drugs or products that contain nicotine and tobacco, and limiting alcohol use. What can I expect for my preventive care visit? Physical exam Your health care provider will check your:  Height and weight. This may be used to calculate body mass index (BMI), which tells if you are at a healthy weight.  Heart rate and blood pressure.  Skin for abnormal spots. Counseling Your health care provider may ask you questions about your:  Alcohol, tobacco, and drug use.  Emotional well-being.  Home and relationship well-being.  Sexual activity.  Eating habits.  Work and work environment.  Method of birth control.  Menstrual cycle.  Pregnancy history. What immunizations do I need?  Influenza (flu) vaccine  This is recommended every year. Tetanus, diphtheria, and pertussis (Tdap) vaccine  You may need a Td booster every 10 years. Varicella (chickenpox) vaccine  You may need this if you have not been vaccinated. Zoster (shingles) vaccine  You may need this after age 60. Measles, mumps, and rubella (MMR) vaccine  You may need at least one dose of MMR if you were born in 1957 or later. You may also need a second dose. Pneumococcal conjugate (PCV13) vaccine  You may need this if you have certain conditions and were not previously  vaccinated. Pneumococcal polysaccharide (PPSV23) vaccine  You may need one or two doses if you smoke cigarettes or if you have certain conditions. Meningococcal conjugate (MenACWY) vaccine  You may need this if you have certain conditions. Hepatitis A vaccine  You may need this if you have certain conditions or if you travel or work in places where you may be exposed to hepatitis A. Hepatitis B vaccine  You may need this if you have certain conditions or if you travel or work in places where you may be exposed to hepatitis B. Haemophilus influenzae type b (Hib) vaccine  You may need this if you have certain conditions. Human papillomavirus (HPV) vaccine  If recommended by your health care provider, you may need three doses over 6 months. You may receive vaccines as individual doses or as more than one vaccine together in one shot (combination vaccines). Talk with your health care provider about the risks and benefits of combination vaccines. What tests do I need? Blood tests  Lipid and cholesterol levels. These may be checked every 5 years, or more frequently if you are over 59 years old.  Hepatitis C test.  Hepatitis B test. Screening  Lung cancer screening. You may have this screening every year starting at age 55 if you have a 30-pack-year history of smoking and currently smoke or have quit within the past 15 years.  Colorectal cancer screening. All adults should have this screening starting at age 59 and continuing until age 75. Your health care provider may recommend screening at age 45 if you are at increased risk. You   will have tests every 1-10 years, depending on your results and the type of screening test.  Diabetes screening. This is done by checking your blood sugar (glucose) after you have not eaten for a while (fasting). You may have this done every 1-3 years.  Mammogram. This may be done every 1-2 years. Talk with your health care provider about when you should start  having regular mammograms. This may depend on whether you have a family history of breast cancer.  BRCA-related cancer screening. This may be done if you have a family history of breast, ovarian, tubal, or peritoneal cancers.  Pelvic exam and Pap test. This may be done every 3 years starting at age 21. Starting at age 30, this may be done every 5 years if you have a Pap test in combination with an HPV test. Other tests  Sexually transmitted disease (STD) testing.  Bone density scan. This is done to screen for osteoporosis. You may have this scan if you are at high risk for osteoporosis. Follow these instructions at home: Eating and drinking  Eat a diet that includes fresh fruits and vegetables, whole grains, lean protein, and low-fat dairy.  Take vitamin and mineral supplements as recommended by your health care provider.  Do not drink alcohol if: ? Your health care provider tells you not to drink. ? You are pregnant, may be pregnant, or are planning to become pregnant.  If you drink alcohol: ? Limit how much you have to 0-1 drink a day. ? Be aware of how much alcohol is in your drink. In the [place] one drink equals one 12 oz bottle of beer (355 mL), one 5 oz glass of wine (148 mL), or one 1 oz glass of hard liquor (44 mL). Lifestyle  Take daily care of your teeth and gums.  Stay active. Exercise for at least 30 minutes on 5 or more days each week.  Do not use any products that contain nicotine or tobacco, such as cigarettes, e-cigarettes, and chewing tobacco. If you need help quitting, ask your health care provider.  If you are sexually active, practice safe sex. Use a condom or other form of birth control (contraception) in order to prevent pregnancy and STIs (sexually transmitted infections).  If told by your health care provider, take low-dose aspirin daily starting at age 59. What's next?  Visit your health care provider once a year for a well check visit.  Ask your health  care provider how often you should have your eyes and teeth checked.  Stay up to date on all vaccines. This information is not intended to replace advice given to you by your health care provider. Make sure you discuss any questions you have with your health care provider. Document Revised: 07/29/2018 Document Reviewed: 07/29/2018 Elsevier Patient Education  2020 Elsevier Inc.  

## 2020-02-17 NOTE — Assessment & Plan Note (Signed)
Asymptomatic.  Continue to monitor

## 2020-02-17 NOTE — Progress Notes (Signed)
Patient: Kathleen Perkins, Female    DOB: [birth year], 59 y.o.   MRN: [id number] Visit Date: 02/17/2020  Today's Provider: Lavon Paganini, MD   Chief Complaint  Patient presents with  . Annual Exam   Subjective:     Annual physical exam Kathleen Perkins is a 59 y.o. female who presents today for health maintenance and complete physical. She feels fairly well. She reports exercising none. She reports she is sleeping poorly.  ----------------------------------------------------------------- Taking Clonazepam not often.  Trying to make sure that she is reliant on a benzo  She has been out of work and is ready to [employer]  Recently with a depressive episode of her bipolar disorder.  Continues to take lamictal with good compliance.  Her anxiety and depression symptoms are high.  She does feel safe to herself and ready to go back to work.  She has been having more palpitations recently.  She was seen previously by cardiology for the symptoms which were thought to be PVCs in the setting of high anxiety.  She takes atenolol daily and then takes it a second time during the day as needed for palpitations.  She feels like this is helpful.  She never has chest pain with these episodes.    Review of Systems  Constitutional: Negative.   HENT: Negative.   Eyes: Negative.   Respiratory: Negative.   Cardiovascular: Negative.   Gastrointestinal: Negative.   Endocrine: Negative.   Genitourinary: Negative.   Musculoskeletal: Negative.   Skin: Negative.   Allergic/Immunologic: Negative.   Neurological: Negative.   Hematological: Negative.   Psychiatric/Behavioral: Positive for sleep disturbance. Negative for agitation, behavioral problems, confusion, decreased concentration, dysphoric mood, hallucinations, self-injury and suicidal ideas. The patient is nervous/anxious. The patient is not hyperactive.     Social History      She  reports that she has never smoked. She has never used  smokeless tobacco. She reports that she does not drink alcohol or use drugs.       Social History   Socioeconomic History  . Marital status: Divorced    Spouse name: Not on file  . Number of children: Not on file  . Years of education: Not on file  . Highest education level: Not on file  Occupational History  . Not on file  Tobacco Use  . Smoking status: Never Smoker  . Smokeless tobacco: Never Used  Substance and Sexual Activity  . Alcohol use: No  . Drug use: No  . Sexual activity: Not on file  Other Topics Concern  . Not on file  Social History Narrative  . Not on file   Social Determinants of Health   Financial Resource Strain:   . Difficulty of Paying Living Expenses:   Food Insecurity:   . Worried About Charity fundraiser in the Last Year:   . Arboriculturist in the Last Year:   Transportation Needs:   . Film/video editor (Medical):   Marland Kitchen Lack of Transportation (Non-Medical):   Physical Activity:   . Days of Exercise per Week:   . Minutes of Exercise per Session:   Stress:   . Feeling of Stress :   Social Connections:   . Frequency of Communication with Friends and Family:   . Frequency of Social Gatherings with Friends and Family:   . Attends Religious Services:   . Active Member of Clubs or Organizations:   . Attends Archivist Meetings:   .  Marital Status:     Past Medical History:  Diagnosis Date  . Anxiety   . Melanoma of upper arm ([place])   . MVP (mitral valve prolapse)   . Primary osteoarthritis of left knee 08/15/2014     Patient Active Problem List   Diagnosis Date Noted  . Hyperlipidemia 11/12/2018  . B12 deficiency 11/12/2018  . Avitaminosis D 11/12/2018  . Overweight 11/12/2018  . Chronic cervical pain 02/25/2017  . History of melanoma 02/25/2017  . Mitral valve prolapse 02/25/2017  . Bipolar affective ([place]) 11/26/2016  . Anxiety and depression 06/01/2015  . Arrhythmia 06/01/2015  . Shortness of breath 06/01/2015     Past Surgical History:  Procedure Laterality Date  . ARTHROSCOPIC REPAIR ACL    . BREAST BIOPSY Left 06/23/2013   u/s bx- neg  . BREAST EXCISIONAL BIOPSY Left 07/14/2013   us/bx-neg  . MELANOMA EXCISION     left arm.   . REPLACEMENT TOTAL KNEE     left     Family History        Family Status  Relation Name Status  . Mother  Deceased       lung cancer   . Father  Deceased  . Sister  Deceased       cancer   . Brother  Alive  . Neg Hx  (Not Specified)        Her family history includes Heart attack in her brother; Heart disease in her brother and father; Hyperlipidemia in her brother and father; Hypertension in her brother and father. There is no history of Breast cancer.      Allergies  Allergen Reactions  . Codeine Nausea Only  . Erythromycin Nausea Only  . Ibuprofen Nausea Only     Current Outpatient Medications:  .  atenolol (TENORMIN) 25 MG tablet, Take 1 tablet (25 mg total) by mouth 2 (two) times daily as needed., Disp: 180 tablet, Rfl: 3 .  clonazePAM (KLONOPIN) 0.5 MG tablet, Take 1 tablet (0.5 mg total) by mouth 2 (two) times daily as needed for anxiety., Disp: 30 tablet, Rfl: 0 .  hydrochlorothiazide (HYDRODIURIL) 12.5 MG tablet, TAKE ONE TABLET BY MOUTH DAILY AS NEEDED FOR EDEMA OF THE LOWER EXTREMITIES, Disp: 30 tablet, Rfl: 2 .  lamoTRIgine (LAMICTAL) 100 MG tablet, Take 1 tablet (100 mg total) by mouth 2 (two) times daily., Disp: 180 tablet, Rfl: 3 .  hydrOXYzine (ATARAX/VISTARIL) 10 MG tablet, Take 1 tablet (10 mg total) by mouth 3 (three) times daily as needed., Disp: 30 tablet, Rfl: 0 .  traZODone (DESYREL) 100 MG tablet, Take 1 tablet (100 mg total) by mouth at bedtime as needed for sleep., Disp: 30 tablet, Rfl: 3   Patient Care Team: Virginia Crews, MD as PCP - General (Family Medicine)    Objective:    Vitals: BP (!) 151/88   Pulse 60   Temp (!) 97.1 F (36.2 C) (Temporal)   Wt 175 lb (79.4 kg)   BMI 29.12 kg/m    Vitals:    02/17/20 1436 02/17/20 1533  BP: (!) 152/89 (!) 151/88  Pulse: 60   Temp: (!) 97.1 F (36.2 C)   TempSrc: Temporal   Weight: 175 lb (79.4 kg)      Physical Exam Vitals reviewed.  Constitutional:      General: She is not in acute distress.    Appearance: Normal appearance. She is well-developed. She is not diaphoretic.  HENT:     Head: Normocephalic and atraumatic.  Right Ear: Tympanic membrane, ear canal and external ear normal.     Left Ear: Tympanic membrane, ear canal and external ear normal.  Eyes:     General: No scleral icterus.    Conjunctiva/sclera: Conjunctivae normal.     Pupils: Pupils are equal, round, and reactive to light.  Neck:     Thyroid: No thyromegaly.  Cardiovascular:     Rate and Rhythm: Normal rate and regular rhythm.     Pulses: Normal pulses.     Heart sounds: Murmur present.  Pulmonary:     Effort: Pulmonary effort is normal. No respiratory distress.     Breath sounds: Normal breath sounds. No wheezing or rales.  Abdominal:     General: There is no distension.     Palpations: Abdomen is soft.     Tenderness: There is no abdominal tenderness. There is no guarding or rebound.  Musculoskeletal:        General: No deformity.     Cervical back: Neck supple.     Right lower leg: No edema.     Left lower leg: No edema.  Lymphadenopathy:     Cervical: No cervical adenopathy.  Skin:    General: Skin is warm and dry.     Capillary Refill: Capillary refill takes less than 2 seconds.     Findings: No rash.  Neurological:     Mental Status: She is alert and oriented to person, place, and time. Mental status is at baseline.  Psychiatric:        Mood and Affect: Mood is anxious and depressed. Affect is labile and tearful.        Speech: Speech is tangential.        Behavior: Behavior normal. Behavior is not agitated or aggressive.        Thought Content: Thought content normal. Thought content does not include homicidal or suicidal ideation. Thought  content does not include homicidal or suicidal plan.      Depression Screen PHQ 2/9 Scores 02/17/2020 11/10/2018 07/09/2018  PHQ - 2 Score 4 0 1  PHQ- 9 Score 16 0 6       Assessment & Plan:     Routine Health Maintenance and Physical Exam  Exercise Activities and Dietary recommendations Goals   None     Immunization History  Administered Date(s) Administered  . Tdap 02/17/2020    Health Maintenance  Topic Date Due  . Hepatitis C Screening  Never done  . HIV Screening  Never done  . COLONOSCOPY  Never done  . MAMMOGRAM  11/26/2019  . INFLUENZA VACCINE  02/29/2020 (Originally 07/02/2019)  . PAP SMEAR-Modifier  10/28/2020  . TETANUS/TDAP  02/16/2030     Discussed health benefits of physical activity, and encouraged her to engage in regular exercise appropriate for her age and condition.    BP is elevated today in the setting of intermittent crying and labile affect.  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 --------------------------------------------------------------------  Problem List Items Addressed This Visit      Cardiovascular and Mediastinum   Arrhythmia    Previously evaluated by cardiology PACs and PVCs Seems to be exacerbated by stress and anxiety Continue atenolol 25 mg daily with a second dose as needed during the day for breakthrough      Relevant Medications   atenolol (TENORMIN) 25 MG tablet   Mitral valve prolapse    Asymptomatic  Continue to monitor      Relevant Medications   atenolol (TENORMIN) 25 MG tablet     Other   Anxiety and depression    Chronic and intermittent with recent worsening This is part of her mood disorder This is treated with her Lamictal She cannot tolerate SSRIs due to history of mania as well as history of serotonin syndrome      Relevant Medications   hydrOXYzine (ATARAX/VISTARIL) 10 MG tablet   traZODone (DESYREL) 100 MG tablet    Bipolar affective ([facility])    Chronic Recent exacerbation Continue Lamictal at current dose Add trazodone for sleep May use hydroxyzine as needed instead of clonazepam She does still fill clonazepam a few times a year to use sparingly She does seem safe to herself and others I do believe that she can return to work as she is not currently manic      History of melanoma    Skin appears clear today Discussed importance of regular skin checks      Hyperlipidemia    Not currently on a statin Repeat FLP and CMP      Relevant Medications   atenolol (TENORMIN) 25 MG tablet   B12 deficiency    Recheck level      Relevant Orders   B12   Avitaminosis D    Recheck level      Relevant Orders   VITAMIN D 25 Hydroxy (Vit-D Deficiency, Fractures)   Overweight    Discussed importance of healthy weight management Discussed diet and exercise        Other Visit Diagnoses    Encounter for annual physical exam    -  Primary   Relevant Orders   CBC w/Diff/Platelet   Comprehensive metabolic panel   Lipid panel   TSH   B12   VITAMIN D 25 Hydroxy (Vit-D Deficiency, Fractures)   Need for hepatitis C screening test       Relevant Orders   Hepatitis C Antibody   Screening for HIV (human immunodeficiency virus)       Relevant Orders   HIV antibody (with reflex)   Screen for colon cancer       Relevant Orders   Ambulatory referral to Gastroenterology   Encounter for screening mammogram for malignant neoplasm of breast       Relevant Orders   MM 3D SCREEN BREAST BILATERAL   Long-term use of high-risk medication       Relevant Orders   CBC w/Diff/Platelet   Comprehensive metabolic panel   Lipid panel   TSH   B12   VITAMIN D 25 Hydroxy (Vit-D Deficiency, Fractures)   Need for Tdap vaccination       Relevant Orders   Tdap vaccine greater than or equal to 7yo IM (Completed)       Return in about 1 year (around 02/16/2021) for CPE.   The entirety of the information  documented in the History of Present Illness, Review of Systems and Physical Exam were personally obtained by me. Portions of this information were initially documented by Ashley Royalty, CMA and reviewed by me for thoroughness and accuracy.    Yasmin Dibello, Dionne Bucy, MD MPH [facility]

## 2020-02-17 NOTE — Assessment & Plan Note (Signed)
Skin appears clear today Discussed importance of regular skin checks

## 2020-02-17 NOTE — Assessment & Plan Note (Signed)
Not currently on a statin Repeat FLP and CMP

## 2020-02-17 NOTE — Assessment & Plan Note (Signed)
Discussed importance of healthy weight management Discussed diet and exercise  

## 2020-03-08 ENCOUNTER — Encounter: Payer: Self-pay | Admitting: *Deleted

## 2020-06-28 ENCOUNTER — Other Ambulatory Visit: Payer: Self-pay | Admitting: Family Medicine

## 2020-06-28 DIAGNOSIS — F418 Other specified anxiety disorders: Secondary | ICD-10-CM

## 2020-06-28 NOTE — Telephone Encounter (Signed)
Requested medication (s) are due for refill today-yes  Requested medication (s) are on the active medication list -yes  Future visit scheduled no  Last refill: hydrochlorothiazide- 02/18/20                 Clonazepam- 05/07/20   Notes to clinic: Request for medication- fails lab protocol, non delegated Rx- sent for review   Requested Prescriptions  Pending Prescriptions Disp Refills   hydrochlorothiazide (HYDRODIURIL) 12.5 MG tablet [Pharmacy Med Name: hydroCHLOROthiazide 12.5 MG TABLET] 90 tablet     Sig: TAKE ONE TABLET BY MOUTH DAILY AS NEEDED FOR EDEMA OF THE LOWER EXTREMITIES      Cardiovascular: Diuretics - Thiazide Failed - 06/28/2020  3:33 PM      Failed - Ca in normal range and within 360 days    No results found for: CALCIUM, CORRECTEDCA, CAWHOLEBLD, POCCA, CALION, CAION, POCICA        Failed - Cr in normal range and within 360 days    No results found for: CREATININE, LABCREAU, LABCREA, POCCRE        Failed - K in normal range and within 360 days    No results found for: K, POTASSIUM, POCK        Failed - Na in normal range and within 360 days    No results found for: NA, POCNA        Failed - Last BP in normal range    BP Readings from Last 1 Encounters:  02/17/20 (!) 151/88          Passed - Valid encounter within last 6 months    Recent Outpatient Visits           4 months ago Encounter for annual physical exam   [facility], Dionne Bucy, MD   9 months ago Contusion of lesser toe of left foot without damage to nail, initial encounter   [facility] [place], Dionne Bucy, MD   1 year ago Encounter for annual physical exam   [facility] [place], Dionne Bucy, MD   1 year ago Tennis Must Quervain's tenosynovitis   [facility] [place], [place], [place]   1 year ago Anxiety and depression   [facility] [place], Dionne Bucy, MD                clonazePAM (KLONOPIN) 0.5 MG tablet  [Pharmacy Med Name: clonazePAM 0.5 MG TABLET] 30 tablet     Sig: TAKE ONE TABLET BY MOUTH TWICE A DAY AS NEEDED FOR ANXIETY      Not Delegated - Psychiatry:  Anxiolytics/Hypnotics Failed - 06/28/2020  3:33 PM      Failed - This refill cannot be delegated      Failed - Urine Drug Screen completed in last 360 days.      Passed - Valid encounter within last 6 months    Recent Outpatient Visits           4 months ago Encounter for annual physical exam   [facility] [place], Dionne Bucy, MD   9 months ago Contusion of lesser toe of left foot without damage to nail, initial encounter   [facility] [place], Dionne Bucy, MD   1 year ago Encounter for annual physical exam   [facility] [place], Dionne Bucy, MD   1 year ago [place], [place], [place]   1 year ago Anxiety and depression  [facility] [place], Dionne Bucy, MD               Signed Prescriptions Disp Refills   traZODone (DESYREL) 100 MG tablet 30 tablet 1    Sig: TAKE ONE TABLET BY MOUTH AT BEDTIME AS NEEDED FOR SLEEP      Psychiatry: Antidepressants - Serotonin Modulator Passed - 06/28/2020  3:33 PM      Passed - Completed PHQ-2 or PHQ-9 in the last 360 days.      Passed - Valid encounter within last 6 months    Recent Outpatient Visits           4 months ago Encounter for annual physical exam   [facility] [place], Dionne Bucy, MD   9 months ago Contusion of lesser toe of left foot without damage to nail, initial encounter   [facility] [place], Dionne Bucy, MD   1 year ago Encounter for annual physical exam   [facility] [place], Dionne Bucy, MD   1 year ago Tennis Must Quervain's tenosynovitis   [facility] [place], [place], [place]   1 year ago Anxiety and depression   [facility] [place], Dionne Bucy, MD                   Requested Prescriptions  Pending Prescriptions Disp Refills   hydrochlorothiazide (HYDRODIURIL) 12.5 MG tablet [Pharmacy Med Name: hydroCHLOROthiazide 12.5 MG TABLET] 90 tablet     Sig: TAKE ONE TABLET BY MOUTH DAILY AS NEEDED FOR EDEMA OF THE LOWER EXTREMITIES      Cardiovascular: Diuretics - Thiazide Failed - 06/28/2020  3:33 PM      Failed - Ca in normal range and within 360 days    No results found for: CALCIUM, CORRECTEDCA, CAWHOLEBLD, POCCA, CALION, CAION, POCICA        Failed - Cr in normal range and within 360 days    No results found for: CREATININE, LABCREAU, LABCREA, POCCRE        Failed - K in normal range and within 360 days    No results found for: K, POTASSIUM, POCK        Failed - Na in normal range and within 360 days    No results found for: NA, POCNA        Failed - Last BP in normal range    BP Readings from Last 1 Encounters:  02/17/20 (!) 151/88          Passed - Valid encounter within last 6 months    Recent Outpatient Visits           4 months ago Encounter for annual physical exam   [facility] [place], Dionne Bucy, MD   9 months ago Contusion of lesser toe of left foot without damage to nail, initial encounter   [facility] [place], Dionne Bucy, MD   1 year ago Encounter for annual physical exam   [facility] [place], Dionne Bucy, MD   1 year ago Harriet Pho tenosynovitis   [facility] [place], [place], [place]   1 year ago Anxiety and depression   [facility] [place], Dionne Bucy, MD                clonazePAM (KLONOPIN) 0.5 MG tablet [Pharmacy Med Name: clonazePAM 0.5 MG TABLET] 30 tablet     Sig: TAKE ONE TABLET BY MOUTH TWICE A DAY AS NEEDED FOR ANXIETY  Not Delegated - Psychiatry:  Anxiolytics/Hypnotics Failed - 06/28/2020  3:33 PM      Failed - This refill cannot be delegated      Failed - Urine Drug Screen completed in last 360 days.       Passed - Valid encounter within last 6 months    Recent Outpatient Visits           4 months ago Encounter for annual physical exam   [facility] [place], Dionne Bucy, MD   9 months ago Contusion of lesser toe of left foot without damage to nail, initial encounter   [facility] [place], Dionne Bucy, MD   1 year ago Encounter for annual physical exam   [facility] [place], Dionne Bucy, MD   1 year ago Harriet Pho tenosynovitis   [facility] [place], [place], [place]   1 year ago Anxiety and depression   [facility] [place], Dionne Bucy, MD               Signed Prescriptions Disp Refills   traZODone (DESYREL) 100 MG tablet 30 tablet 1    Sig: TAKE ONE TABLET BY MOUTH AT BEDTIME AS NEEDED FOR SLEEP      Psychiatry: Antidepressants - Serotonin Modulator Passed - 06/28/2020  3:33 PM      Passed - Completed PHQ-2 or PHQ-9 in the last 360 days.      Passed - Valid encounter within last 6 months    Recent Outpatient Visits           4 months ago Encounter for annual physical exam   [facility] [place], Dionne Bucy, MD   9 months ago Contusion of lesser toe of left foot without damage to nail, initial encounter   [facility] [place], Dionne Bucy, MD   1 year ago Encounter for annual physical exam   [facility] [place], Dionne Bucy, MD   1 year ago Harriet Pho tenosynovitis   [facility] [place], [place], [place]   1 year ago Anxiety and depression   [facility] [place], Dionne Bucy, MD

## 2020-07-12 ENCOUNTER — Ambulatory Visit: Payer: Self-pay | Admitting: Physician Assistant

## 2020-07-12 ENCOUNTER — Telehealth: Payer: Self-pay

## 2020-07-12 NOTE — Telephone Encounter (Signed)
Just FYI.

## 2020-07-12 NOTE — Telephone Encounter (Signed)
Copied from [place] [id number]. Topic: General - Other >> Jul 12, 2020  8:03 AM Oneta Rack wrote: Reason for CRM: patient cancelled 11am with Fabio Bering today due to her being sick, offered a virtual patient declined and stated will call back.

## 2020-07-13 ENCOUNTER — Telehealth: Payer: Self-pay | Admitting: Cardiovascular Disease

## 2020-07-13 NOTE — Telephone Encounter (Signed)
OK with me.

## 2020-07-13 NOTE — Telephone Encounter (Signed)
Patient would like to switch from Dr. Rockey Situ to Dr. Acie Fredrickson. Are you both in agreement to this?

## 2020-07-16 ENCOUNTER — Encounter: Payer: Self-pay | Admitting: Family Medicine

## 2020-07-16 ENCOUNTER — Telehealth (INDEPENDENT_AMBULATORY_CARE_PROVIDER_SITE_OTHER): Payer: BC Managed Care – PPO | Admitting: Family Medicine

## 2020-07-16 ENCOUNTER — Telehealth: Payer: Self-pay

## 2020-07-16 DIAGNOSIS — F3162 Bipolar disorder, current episode mixed, moderate: Secondary | ICD-10-CM | POA: Diagnosis not present

## 2020-07-16 DIAGNOSIS — F32A Depression, unspecified: Secondary | ICD-10-CM

## 2020-07-16 DIAGNOSIS — Z79899 Other long term (current) drug therapy: Secondary | ICD-10-CM | POA: Diagnosis not present

## 2020-07-16 DIAGNOSIS — F419 Anxiety disorder, unspecified: Secondary | ICD-10-CM | POA: Diagnosis not present

## 2020-07-16 DIAGNOSIS — F329 Major depressive disorder, single episode, unspecified: Secondary | ICD-10-CM

## 2020-07-16 MED ORDER — LAMOTRIGINE 150 MG PO TABS: 150.0000 mg | ORAL_TABLET | Freq: Two times a day (BID) | ORAL | 1 refills | Status: DC

## 2020-07-16 NOTE — Telephone Encounter (Signed)
-----   Message from Thayer Headings, MD sent at 07/16/2020  8:43 AM EDT ----- Julianne Rice is a long time friend and neighbor of mine from [employer].   She has seen Dr. Rockey Situ in 2016 and she has requested to change providers to me.  Assuming Dr. Rockey Situ gives his OK I can see her next week.     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 24 or I can see her around 3:30 or 4:00 on aug 26 or 27.   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   I would need some help from a nurse and a free POD - we havent been very full so hopefully that will work .  Thanks  Have a great day  [name]

## 2020-07-16 NOTE — Assessment & Plan Note (Signed)
Chronic and uncontrolled Part of her mood disorder Treatment with LAmictal as below Avoid SSRIs due to risk of mania and h/o serotonin syndrome

## 2020-07-16 NOTE — Telephone Encounter (Signed)
Copied from [place] [phone]. Topic: General - Other >> Jul 16, 2020  1:02 PM Sheran Luz wrote: Patient inquired if Dr's note (she would not specify) was available for pick up. She spoke with office earlier today, per patient.

## 2020-07-16 NOTE — Assessment & Plan Note (Signed)
Chronic, uncontrolled Current exacerbation with both hypomania and depression symptoms Increase Lamictal to 150mg  BID Continue trazodone Ok to use hydroxyzine/clonazepam prn - uses sparingly Does not seem to be a danger to herself or others at this time Will write work note for absences due to mood disorder Has upcoming appt in 1 month with [facility] Return precautions discussed F/u in 1 week

## 2020-07-16 NOTE — Progress Notes (Signed)
Virtual telephone visit    Virtual Visit via Telephone Note   This visit type was conducted due to national recommendations for restrictions regarding the COVID-19 Pandemic (e.g. social distancing) in an effort to limit this patient's exposure and mitigate transmission in our community. Due to her co-morbid illnesses, this patient is at least at moderate risk for complications without adequate follow up. This format is felt to be most appropriate for this patient at this time. The patient did not have access to video technology or had technical difficulties with video requiring transitioning to audio format only (telephone). Physical exam was limited to content and character of the telephone converstion.     Patient location: home Provider location: [place] involved in the visit: patient, provider   Visit Date: 07/16/2020  Today's healthcare provider: Lavon Paganini, MD   No chief complaint on file.  Subjective    Anxiety Presents for follow-up visit. Symptoms include decreased concentration, depressed mood, excessive worry, nervous/anxious behavior, panic and shortness of breath. Patient reports no confusion, dizziness, insomnia or suicidal ideas. The severity of symptoms is interfering with daily activities. The quality of sleep is good.    Depression        The problem has been gradually worsening since onset.  Associated symptoms include decreased concentration, fatigue and appetite change.  Associated symptoms include does not have insomnia and no suicidal ideas.  Past medical history includes anxiety and bipolar disorder.     Feels somewhat manic in the last 1-2 weeks. The ups and downs are coming more frequently Focus is decreased Feels like the lows are lower as well - very depressed Has not left the house in the last week Decreased appetite Has appt with [facility] for psych 9/15 Taking lamictal with good compliance Taking klonopin  prn Decreased sleeping when feeling hypomanic - taking trazodone   GAD 7 : Generalized Anxiety Score 02/17/2020 07/09/2018  Nervous, Anxious, on Edge 2 2  Control/stop worrying 2 1  Worry too much - different things 2 1  Trouble relaxing 2 2  Restless 0 0  Easily annoyed or irritable 0 1  Afraid - awful might happen 0 2  Total GAD 7 Score 8 9  Anxiety Difficulty Very difficult Somewhat difficult    Depression screen [facility] 2/9 02/17/2020 11/10/2018 07/09/2018  Decreased Interest 2 0 0  Down, Depressed, Hopeless 2 0 1  PHQ - 2 Score 4 0 1  Altered sleeping 2 0 1  Tired, decreased energy 2 0 1  Change in appetite 2 0 1  Feeling bad or failure about yourself  2 0 1  Trouble concentrating 2 0 1  Moving slowly or fidgety/restless 1 0 0  Suicidal thoughts 1 0 0  PHQ-9 Score 16 0 6  Difficult doing work/chores Very difficult Not difficult at all Somewhat difficult     Patient Active Problem List   Diagnosis Date Noted  . Hyperlipidemia 11/12/2018  . B12 deficiency 11/12/2018  . Avitaminosis D 11/12/2018  . Overweight 11/12/2018  . Chronic cervical pain 02/25/2017  . History of melanoma 02/25/2017  . Mitral valve prolapse 02/25/2017  . Bipolar affective ([place]) 11/26/2016  . Anxiety and depression 06/01/2015  . Arrhythmia 06/01/2015  . Shortness of breath 06/01/2015   Past Medical History:  Diagnosis Date  . Anxiety   . Melanoma of upper arm ([place])   . MVP (mitral valve prolapse)   . Primary osteoarthritis of left knee 08/15/2014   Social History  Tobacco Use  . Smoking status: Never Smoker  . Smokeless tobacco: Never Used  Substance Use Topics  . Alcohol use: No  . Drug use: No   Allergies  Allergen Reactions  . Codeine Nausea Only  . Erythromycin Nausea Only  . Ibuprofen Nausea Only    Medications: Outpatient Medications Prior to Visit  Medication Sig  . atenolol (TENORMIN) 25 MG tablet Take 1 tablet (25 mg total) by mouth 2 (two) times daily as needed.  .  clonazePAM (KLONOPIN) 0.5 MG tablet TAKE ONE TABLET BY MOUTH TWICE A DAY AS NEEDED FOR ANXIETY  . hydrochlorothiazide (HYDRODIURIL) 12.5 MG tablet TAKE ONE TABLET BY MOUTH DAILY AS NEEDED FOR EDEMA OF THE LOWER EXTREMITIES  . hydrOXYzine (ATARAX/VISTARIL) 10 MG tablet Take 1 tablet (10 mg total) by mouth 3 (three) times daily as needed.  . lamoTRIgine (LAMICTAL) 100 MG tablet Take 1 tablet (100 mg total) by mouth 2 (two) times daily.  . traZODone (DESYREL) 100 MG tablet TAKE ONE TABLET BY MOUTH AT BEDTIME AS NEEDED FOR SLEEP   No facility-administered medications prior to visit.    Review of Systems  Constitutional: Positive for appetite change and fatigue. Negative for activity change, chills, diaphoresis, fever and unexpected weight change.  Respiratory: Positive for shortness of breath. Negative for apnea, cough, choking, chest tightness, wheezing and stridor.   Cardiovascular: Negative.   Gastrointestinal: Negative.   Neurological: Negative for dizziness, tremors and weakness.  Psychiatric/Behavioral: Positive for decreased concentration, depression and dysphoric mood. Negative for agitation, behavioral problems, confusion, hallucinations, self-injury, sleep disturbance and suicidal ideas. The patient is nervous/anxious. The patient does not have insomnia and is not hyperactive.       Objective    There were no vitals taken for this visit.  Speaking in full sentences in NAD Pressured speech Depressed mood   Assessment & Plan     Problem List Items Addressed This Visit      Other   Anxiety and depression    Chronic and uncontrolled Part of her mood disorder Treatment with LAmictal as below Avoid SSRIs due to risk of mania and h/o serotonin syndrome      Relevant Orders   CBC w/Diff/Platelet   Comprehensive metabolic panel   Bipolar affective ([place]) - Primary    Chronic, uncontrolled Current exacerbation with both hypomania and depression symptoms Increase Lamictal to  150mg  BID Continue trazodone Ok to use hydroxyzine/clonazepam prn - uses sparingly Does not seem to be a danger to herself or others at this time Will write work note for absences due to mood disorder Has upcoming appt in 1 month with [facility] Return precautions discussed F/u in 1 week      Relevant Orders   CBC w/Diff/Platelet   Comprehensive metabolic panel    Other Visit Diagnoses    Long-term use of high-risk medication       Relevant Orders   CBC w/Diff/Platelet   Comprehensive metabolic panel   Lipid panel       No follow-ups on file.    I discussed the assessment and treatment plan with the patient. The patient was provided an opportunity to ask questions and all were answered. The patient agreed with the plan and demonstrated an understanding of the instructions.   The patient was advised to call back or seek an in-person evaluation if the symptoms worsen or if the condition fails to improve as anticipated.  I provided 25 minutes of non-face-to-face time during this encounter.  Lynford Citizen  Brita Romp, MD, have reviewed all documentation for this visit. The documentation on 07/16/20 for the exam, diagnosis, procedures, and orders are all accurate and complete.   Deneice Wack, Dionne Bucy, MD, MPH [facility]

## 2020-07-16 NOTE — Telephone Encounter (Signed)
It is at front desk 

## 2020-07-17 ENCOUNTER — Telehealth: Payer: Self-pay

## 2020-07-17 NOTE — Telephone Encounter (Signed)
Both providers approved the switch.  Called pt and scheduled her to come in Tuesday, 8/24, to see Dr. Acie Fredrickson.  Pt appreciative for call.

## 2020-07-17 NOTE — Telephone Encounter (Signed)
Ok with me Thx, TG

## 2020-07-17 NOTE — Telephone Encounter (Signed)
Patient has been advised. KW 

## 2020-07-17 NOTE — Telephone Encounter (Signed)
Copied from [place] [id number]. Topic: General - Other >> Jul 17, 2020 11:05 AM Hinda Lenis D wrote: PT lose her job and don't have insurance at this time, she cancel her appts / please advise Dr B per PT

## 2020-07-18 ENCOUNTER — Telehealth: Payer: Self-pay

## 2020-07-18 NOTE — Telephone Encounter (Signed)
Copied from [place] [id number]. Topic: General - Inquiry >> Jul 18, 2020 11:59 AM Gillis Ends D wrote: Reason for CRM: Patient request a copy of a letter that was written for her back in February in reference to medical absence from her job. She asks that you let her know when it is ready for pick up or if you have any questions. Please advise the patient.

## 2020-07-18 NOTE — Telephone Encounter (Signed)
Patient calling back about this request. She states a letter was given to her on 03/18 during her CPE excusing her from work.

## 2020-07-18 NOTE — Telephone Encounter (Signed)
I'm really sorry to hear this, but there is no encounter for neck pain and no letter.  Would not have done it except in Epic and it's not there.  Did she maybe see Urgent care or another office/specialist?

## 2020-07-18 NOTE — Telephone Encounter (Signed)
Left message for patient to call office back, I see there is a note from 07/16/20 excusing patient from work until 07/24/20. There is no note scanned or written in for February from what I saw. KW

## 2020-07-18 NOTE — Telephone Encounter (Signed)
Spoke with patient on the phone she states that date of appointment you had written her a letter excusing her out of work from 3/8-319/21 due to neck injury. Patient states that her employer has terminated her stating that they did not approve leave of absence. Patient is adamant that letter was handed to her by someone at the front desk, she is requesting that this be written again. She also wants to let Dr. Brita Romp know she is still waiting to hear back from psychiatry she also states that her appt with cardiology is scheduled for 07/24/20. KW

## 2020-07-18 NOTE — Telephone Encounter (Signed)
Noted  

## 2020-07-18 NOTE — Telephone Encounter (Signed)
There is no letter from that date in the chart.  I would have written it in Epic if I wrote one

## 2020-07-20 NOTE — Telephone Encounter (Signed)
Left detailed message for patient, to let her know that no letter was found for February.

## 2020-07-23 ENCOUNTER — Encounter: Payer: Self-pay | Admitting: Cardiovascular Disease

## 2020-07-23 NOTE — Progress Notes (Signed)
This encounter was created in error - please disregard.

## 2020-07-24 ENCOUNTER — Ambulatory Visit: Payer: BC Managed Care – PPO | Admitting: Family Medicine

## 2020-07-24 ENCOUNTER — Encounter: Payer: Self-pay | Admitting: Cardiovascular Disease

## 2020-08-02 ENCOUNTER — Other Ambulatory Visit: Payer: Self-pay | Admitting: Family Medicine

## 2020-08-27 ENCOUNTER — Ambulatory Visit: Payer: Self-pay | Admitting: Cardiovascular Disease

## 2020-09-04 ENCOUNTER — Other Ambulatory Visit: Payer: Self-pay | Admitting: Family Medicine

## 2020-09-04 DIAGNOSIS — F418 Other specified anxiety disorders: Secondary | ICD-10-CM

## 2020-09-04 MED ORDER — CLONAZEPAM 0.5 MG PO TABS: 0.5000 mg | ORAL_TABLET | Freq: Two times a day (BID) | ORAL | 0 refills | Status: DC

## 2020-09-04 NOTE — Telephone Encounter (Signed)
[place] faxed refill request for the following medications:  clonazePAM (KLONOPIN) 0.5 MG tablet   Please advise. Thanks, [employer]

## 2020-09-05 ENCOUNTER — Other Ambulatory Visit: Payer: Self-pay | Admitting: Family Medicine

## 2020-09-05 NOTE — Telephone Encounter (Signed)
[place] faxed refill request for the following medications:  hydrochlorothiazide (HYDRODIURIL) 12.5 MG tablet   Please advise. Thanks, [employer]

## 2020-09-05 NOTE — Telephone Encounter (Signed)
Dr. Sharmaine Base pt. Please review. Thanks!

## 2020-09-07 MED ORDER — HYDROCHLOROTHIAZIDE 12.5 MG PO TABS: 12.5000 mg | ORAL_TABLET | Freq: Every day | ORAL | 0 refills | Status: DC

## 2020-09-09 ENCOUNTER — Other Ambulatory Visit: Payer: Self-pay | Admitting: Family Medicine

## 2020-09-09 NOTE — Telephone Encounter (Signed)
Requested Prescriptions  Pending Prescriptions Disp Refills  . traZODone (DESYREL) 100 MG tablet [Pharmacy Med Name: traZODone 100 MG TABLET] 30 tablet 1    Sig: TAKE ONE TABLET BY MOUTH EVERY NIGHT AT BEDTIME AS NEEDED FOR SLEEP     Psychiatry: Antidepressants - Serotonin Modulator Passed - 09/09/2020 12:43 PM      Passed - Completed PHQ-2 or PHQ-9 in the last 360 days.      Passed - Valid encounter within last 6 months    Recent Outpatient Visits          1 month ago Bipolar disorder, current episode mixed, moderate ([place])   [facility] [place], Dionne Bucy, MD   6 months ago Encounter for annual physical exam   [facility] [place], Dionne Bucy, MD   1 year ago Contusion of lesser toe of left foot without damage to nail, initial encounter   [facility] [place], Dionne Bucy, MD   1 year ago Encounter for annual physical exam   [facility] [place], Dionne Bucy, MD   2 years ago [facility], Wendee Beavers, [place]      Future Appointments            In 1 month N[place]ser, Wonda Cheng, MD [place]

## 2020-09-10 ENCOUNTER — Telehealth: Payer: Self-pay

## 2020-09-10 NOTE — Telephone Encounter (Signed)
Please review. Thanks!  

## 2020-09-10 NOTE — Telephone Encounter (Signed)
Copied from [place] [phone]. Topic: General - Inquiry >> Sep 10, 2020 11:35 AM Greggory Keen D wrote: Reason for CRM: Pt called saying she has a rx for Lamotrigine 150 mg 2 times a day.  She has picked it up already at the pharmacy.   She only got a rx for one month but the pharmacy told her that if Dr. B could send it in for 3 months they will adjust the prescription so she wont have to pay for 1 month at a time.  [place]  CB#  [phone]

## 2020-09-11 NOTE — Telephone Encounter (Signed)
I only wrote for one month as she was supposed to be seeing a psychiatrist in 1 month.

## 2020-09-13 MED ORDER — LAMOTRIGINE 150 MG PO TABS: 150.0000 mg | ORAL_TABLET | Freq: Two times a day (BID) | ORAL | 1 refills | Status: DC

## 2020-09-13 NOTE — Telephone Encounter (Signed)
Attempted to contact patient, no answer and voicemail is full. Okay for PEC to advise patient.

## 2020-09-13 NOTE — Telephone Encounter (Signed)
Ok to refill Lamictal x30 days with 1 refill to get her to appt with Dr Nicolasa Ducking. Thanks!

## 2020-09-13 NOTE — Addendum Note (Signed)
Addended by: Jules Schick on: 09/13/2020 04:26 PM   Modules accepted: Orders

## 2020-09-13 NOTE — Telephone Encounter (Signed)
Pt. Called back. Reports she has lost her job and insurance. Will have [employer] soon. Had to reschedule her appointment with psychiatry due to loss of medical insurance . Has an appointment with Dr. Nicolasa Ducking 11/12/20. Asking if Dr. Brita Romp will continue to refill Lamictal 30 days at a time until this appointment. Also has a cardiology appointment 10/09/20. Please advise pt.

## 2020-09-13 NOTE — Telephone Encounter (Signed)
RX sent to pharmacy  

## 2020-09-26 ENCOUNTER — Ambulatory Visit: Payer: Self-pay | Admitting: Cardiovascular Disease

## 2020-10-05 ENCOUNTER — Other Ambulatory Visit: Payer: Self-pay | Admitting: Family Medicine

## 2020-10-05 ENCOUNTER — Telehealth: Payer: Self-pay

## 2020-10-05 NOTE — Telephone Encounter (Signed)
Copied from [place] [phone]. Topic: Quick Communication - Office Called Patient (Clinic Use ONLY) >> Oct 05, 2020 12:42 PM Lennox Solders wrote: Reason for CRM: Pt is calling to schedule covid vaccine and return the office call

## 2020-10-07 NOTE — Progress Notes (Signed)
This encounter was created in error - please disregard.

## 2020-10-09 ENCOUNTER — Encounter: Payer: Self-pay | Admitting: Cardiovascular Disease

## 2020-10-31 ENCOUNTER — Other Ambulatory Visit: Payer: Self-pay

## 2020-10-31 DIAGNOSIS — Z111 Encounter for screening for respiratory tuberculosis: Secondary | ICD-10-CM

## 2020-10-31 DIAGNOSIS — F418 Other specified anxiety disorders: Secondary | ICD-10-CM

## 2020-10-31 NOTE — Telephone Encounter (Signed)
Please advise 

## 2020-10-31 NOTE — Telephone Encounter (Signed)
Copied from [place] [phone]. Topic: General - Other >> Oct 31, 2020  3:48 PM Leward Quan A wrote: Reason for CRM: Patient called in to ask if she can get a TB sking test at the same time that she come in 11/01/20 for her Covid vaccine. Please advise Ph# [phone]

## 2020-11-01 ENCOUNTER — Ambulatory Visit (INDEPENDENT_AMBULATORY_CARE_PROVIDER_SITE_OTHER): Payer: 59

## 2020-11-01 ENCOUNTER — Ambulatory Visit: Payer: Self-pay

## 2020-11-01 ENCOUNTER — Other Ambulatory Visit: Payer: Self-pay

## 2020-11-01 ENCOUNTER — Ambulatory Visit: Payer: BC Managed Care – PPO

## 2020-11-01 DIAGNOSIS — Z23 Encounter for immunization: Secondary | ICD-10-CM | POA: Diagnosis not present

## 2020-11-01 NOTE — Telephone Encounter (Signed)
  Pt. Received Pfizer COVID 19 vaccine this morning.Started having symptoms at 2:00. Nausea "is overwhelming." Asking for something nausea, vomiting.Vomited x 1. Has body aches as well. Requests medication for nausea be sent to pharmacy. Please advise. Answer Assessment - Initial Assessment Questions 1. SYMPTOMS: "What is the main symptom?" (e.g., redness, swelling, pain)      Nausea, vomiting,body aches 2. ONSET: "When was the vaccine (shot) given?" "How much later did the vomiting begin?" (e.g., hours, days ago)      Today - 0900 3. SEVERITY: "How bad is it?"      Moderate 4. FEVER: "Is there a fever?" If Yes, ask: "What is it, how was it measured, and when did it start?"      No 5. IMMUNIZATIONS GIVEN: "What shots have you recently received?"     COVID 19 6. PAST REACTIONS: "Have you reacted to immunizations before?" If Yes, ask: "What happened?"     No 7. OTHER SYMPTOMS: "Do you have any other symptoms?"     Headache  Protocols used: IMMUNIZATION REACTIONS-A-AH

## 2020-11-01 NOTE — Telephone Encounter (Signed)
Pt advised.   Thanks,   -Hennesy Sobalvarro  

## 2020-11-01 NOTE — Telephone Encounter (Signed)
Can get a quant gold. We don't do TB skin test here.

## 2020-11-01 NOTE — Telephone Encounter (Signed)
Lab order.  Tried calling to advise pt but her Voicemail box if full.   Thanks,   -Mickel Baas

## 2020-11-01 NOTE — Telephone Encounter (Signed)
Pt would like to get a lab order for TB quantiferon gold for her new job.   Pt is also requesting refills for the following medications:  clonazePAM (KLONOPIN) 0.5 MG tablet  traZODone (DESYREL) 100 MG tablet  LOV: 07/16/2020 Please advised. Thanks TNP

## 2020-11-02 MED ORDER — ONDANSETRON HCL 4 MG PO TABS: 4.0000 mg | ORAL_TABLET | Freq: Three times a day (TID) | ORAL | 0 refills | Status: DC | PRN

## 2020-11-02 MED ORDER — TRAZODONE HCL 100 MG PO TABS: 100.0000 mg | ORAL_TABLET | Freq: Every evening | ORAL | 1 refills | Status: DC | PRN

## 2020-11-02 MED ORDER — CLONAZEPAM 0.5 MG PO TABS: 0.5000 mg | ORAL_TABLET | Freq: Two times a day (BID) | ORAL | 1 refills | Status: DC

## 2020-11-02 NOTE — Telephone Encounter (Signed)
Ok to send in Zofran 4mg  TID prn #20 r0. Thanks

## 2020-11-02 NOTE — Addendum Note (Signed)
Addended by: Ashley Royalty E on: 11/02/2020 01:39 PM   Modules accepted: Orders

## 2020-11-08 ENCOUNTER — Other Ambulatory Visit: Payer: Self-pay | Admitting: Physician Assistant

## 2020-11-11 LAB — CBC WITH DIFFERENTIAL/PLATELET
Basophils Absolute: 0 10*3/uL (ref 0.0–0.2)
Basos: 1 %
EOS (ABSOLUTE): 0.2 10*3/uL (ref 0.0–0.4)
Eos: 3 %
Hematocrit: 40.2 % (ref 34.0–46.6)
Hemoglobin: 14.1 g/dL (ref 11.1–15.9)
Immature Grans (Abs): 0 10*3/uL (ref 0.0–0.1)
Immature Granulocytes: 0 %
Lymphocytes Absolute: 1.9 10*3/uL (ref 0.7–3.1)
Lymphs: 32 %
MCH: 31.3 pg (ref 26.6–33.0)
MCHC: 35.1 g/dL (ref 31.5–35.7)
MCV: 89 fL (ref 79–97)
Monocytes Absolute: 0.7 10*3/uL (ref 0.1–0.9)
Monocytes: 11 %
Neutrophils Absolute: 3.3 10*3/uL (ref 1.4–7.0)
Neutrophils: 53 %
Platelets: 403 10*3/uL (ref 150–450)
RBC: 4.51 x10E6/uL (ref 3.77–5.28)
RDW: 11.7 % (ref 11.7–15.4)
WBC: 6.1 10*3/uL (ref 3.4–10.8)

## 2020-11-11 LAB — COMPREHENSIVE METABOLIC PANEL
ALT: 32 IU/L (ref 0–32)
AST: 30 IU/L (ref 0–40)
Albumin/Globulin Ratio: 1.9 (ref 1.2–2.2)
Albumin: 5 g/dL — ABNORMAL HIGH (ref 3.8–4.9)
Alkaline Phosphatase: 93 IU/L (ref 44–121)
BUN/Creatinine Ratio: 12 (ref 9–23)
BUN: 10 mg/dL (ref 6–24)
Bilirubin Total: 0.4 mg/dL (ref 0.0–1.2)
CO2: 24 mmol/L (ref 20–29)
Calcium: 10.1 mg/dL (ref 8.7–10.2)
Chloride: 95 mmol/L — ABNORMAL LOW (ref 96–106)
Creatinine, Ser: 0.84 mg/dL (ref 0.57–1.00)
GFR calc Af Amer: 88 mL/min/{1.73_m2} (ref >59)
GFR calc non Af Amer: 76 mL/min/{1.73_m2} (ref >59)
Globulin, Total: 2.7 g/dL (ref 1.5–4.5)
Glucose: 100 mg/dL — ABNORMAL HIGH (ref 65–99)
Potassium: 4.3 mmol/L (ref 3.5–5.2)
Sodium: 134 mmol/L (ref 134–144)
Total Protein: 7.7 g/dL (ref 6.0–8.5)

## 2020-11-11 LAB — LIPID PANEL
Chol/HDL Ratio: 2.9 ratio (ref 0.0–4.4)
Cholesterol, Total: 268 mg/dL — ABNORMAL HIGH (ref 100–199)
HDL: 93 mg/dL (ref >39)
LDL Chol Calc (NIH): 145 mg/dL — ABNORMAL HIGH (ref 0–99)
Triglycerides: 175 mg/dL — ABNORMAL HIGH (ref 0–149)
VLDL Cholesterol Cal: 30 mg/dL (ref 5–40)

## 2020-11-11 LAB — QUANTIFERON-TB GOLD PLUS
QuantiFERON Mitogen Value: >10 IU/mL
QuantiFERON Nil Value: 0.05 IU/mL
QuantiFERON TB1 Ag Value: 0.03 IU/mL
QuantiFERON TB2 Ag Value: 0.03 IU/mL
QuantiFERON-TB Gold Plus: NEGATIVE

## 2020-11-12 ENCOUNTER — Telehealth: Payer: Self-pay | Admitting: Family Medicine

## 2020-11-12 NOTE — Telephone Encounter (Signed)
Patient is calling back for her lab results. Please advise CB- [phone]

## 2020-11-21 ENCOUNTER — Ambulatory Visit: Payer: 59 | Admitting: Physician Assistant

## 2020-11-22 ENCOUNTER — Ambulatory Visit (INDEPENDENT_AMBULATORY_CARE_PROVIDER_SITE_OTHER): Payer: Self-pay | Admitting: Family Medicine

## 2020-11-22 ENCOUNTER — Other Ambulatory Visit: Payer: Self-pay

## 2020-11-22 ENCOUNTER — Ambulatory Visit: Payer: Self-pay | Admitting: Adult Health

## 2020-11-22 DIAGNOSIS — Z23 Encounter for immunization: Secondary | ICD-10-CM

## 2020-11-28 ENCOUNTER — Ambulatory Visit: Payer: BC Managed Care – PPO

## 2020-12-04 ENCOUNTER — Ambulatory Visit: Payer: Self-pay | Admitting: Cardiovascular Disease

## 2020-12-04 ENCOUNTER — Ambulatory Visit: Payer: 59 | Admitting: Family Medicine

## 2020-12-06 ENCOUNTER — Ambulatory Visit (INDEPENDENT_AMBULATORY_CARE_PROVIDER_SITE_OTHER): Payer: 59 | Admitting: Adult Health

## 2020-12-06 ENCOUNTER — Telehealth: Payer: Self-pay

## 2020-12-06 ENCOUNTER — Other Ambulatory Visit: Payer: Self-pay

## 2020-12-06 DIAGNOSIS — Z23 Encounter for immunization: Secondary | ICD-10-CM | POA: Diagnosis not present

## 2020-12-06 NOTE — Telephone Encounter (Signed)
Pt called stating that she did set up an appt and would like to discuss the paperwork and speak with PCP. Please advise.

## 2020-12-06 NOTE — Telephone Encounter (Signed)
Copied from [place] [id number]. Topic: General - Other >> Dec 06, 2020  2:05 PM Cuthrell, Pearlean Brownie wrote: Reason for CRM: Patient called in stated she dropped of a physical form for work, patient was advised of turn around time, but patient states that she needs the form back as soon as possible for work, Patient is requesting a call back. C/B [phone]

## 2020-12-06 NOTE — Telephone Encounter (Signed)
Pt dropped off form to be completed by Dr. B as proof of last CPE on 02/17/2020. Pt request that she pick up form today or tomorrow because she can't start her new job until she submits completed form. Form placed in Dr. Senaida Lange box. Please advise. Thanks TNP

## 2020-12-07 ENCOUNTER — Ambulatory Visit (INDEPENDENT_AMBULATORY_CARE_PROVIDER_SITE_OTHER): Payer: 59 | Admitting: Cardiovascular Disease

## 2020-12-07 ENCOUNTER — Encounter: Payer: Self-pay | Admitting: Cardiovascular Disease

## 2020-12-07 ENCOUNTER — Other Ambulatory Visit: Payer: Self-pay

## 2020-12-07 VITALS — BP 122/72 | HR 67 | Ht 64.0 in | Wt 188.6 lb

## 2020-12-07 DIAGNOSIS — R002 Palpitations: Secondary | ICD-10-CM

## 2020-12-07 DIAGNOSIS — E782 Mixed hyperlipidemia: Secondary | ICD-10-CM | POA: Diagnosis not present

## 2020-12-07 MED ORDER — PROPRANOLOL HCL 10 MG PO TABS: 10.0000 mg | ORAL_TABLET | Freq: Four times a day (QID) | ORAL | 3 refills | Status: DC | PRN

## 2020-12-07 NOTE — Patient Instructions (Signed)
Medication Instructions:  Your physician has recommended you make the following change in your medication:  1.) propranolol 10 mg - take one tablet by mouth every 6 hours as needed for palpitations  *If you need a refill on your cardiac medications before your next appointment, please call your pharmacy*   Lab Work: In 6 months before your follow up visit, unless done by your PCP (lipids, liver function, basic metabolic panel)   Testing/Procedures: none   Follow-Up: At [facility], you and your health needs are our priority.  As part of our continuing mission to provide you with exceptional heart care, we have created designated Provider Care Teams.  These Care Teams include your primary Cardiologist (physician) and Advanced Practice Providers (APPs -  Physician Assistants and Nurse Practitioners) who all work together to provide you with the care you need, when you need it.  We recommend signing up for the patient portal called "MyChart".  Sign up information is provided on this After Visit Summary.  MyChart is used to connect with patients for Virtual Visits (Telemedicine).  Patients are able to view lab/test results, encounter notes, upcoming appointments, etc.  Non-urgent messages can be sent to your provider as well.   To learn more about what you can do with MyChart, go to [email or web address].    Your next appointment:   6 month(s)  The format for your next appointment:   In Person  Provider:   Mertie Moores, MD   Other Instructions: Labs prior to next visit, unless done by your PCP. (bmet, lipids/liver function)

## 2020-12-07 NOTE — Telephone Encounter (Signed)
Form completed.  Left up front for pick up

## 2020-12-07 NOTE — Telephone Encounter (Signed)
Pt advised.   Thanks,   -Nolah Krenzer  

## 2020-12-07 NOTE — Progress Notes (Signed)
Cardiology Office Note:    Date:  12/07/2020   ID:  Kathleen Perkins, DOB 01/25/1961, MRN [id number]  PCP:  Kathleen Crews, MD  [facility] Cardiologist:  Shakti Fleer  [facility] Electrophysiologist:  None   Referring MD: Kathleen Crews, MD   Chief Complaint  Patient presents with  . Palpitations     Jan. 7, 2022   Kathleen Perkins is a 60 y.o. female with a hx of palpitations.  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  Has had palpitations - was started on Verapamil Then atenolol  Worse with menapause  palps seem to be worse with stress Episodes of tachycardia  - would last perhaps 30 minutes   Exercises intermittantly.  Did well around 2017 for years , not so much this past year Really enjoyed exercising.  Admits that her palpitations   Tries to avoid caffeine,   Has bipolar - tends to be more manic than depressed.   Past Medical History:  Diagnosis Date  . Anxiety   . Melanoma of upper arm ([place])   . MVP (mitral valve prolapse)   . Primary osteoarthritis of left knee 08/15/2014    Past Surgical History:  Procedure Laterality Date  . ARTHROSCOPIC REPAIR ACL    . BREAST BIOPSY Left 06/23/2013   u/s bx- neg  . BREAST EXCISIONAL BIOPSY Left 07/14/2013   us/bx-neg  . MELANOMA EXCISION     left arm.   . REPLACEMENT TOTAL KNEE     left     Current Medications: Current Meds  Medication Sig  . atenolol (TENORMIN) 25 MG tablet Take 1 tablet (25 mg total) by mouth 2 (two) times daily as needed.  . clonazePAM (KLONOPIN) 0.5 MG tablet Take 1 tablet (0.5 mg total) by mouth 2 (two) times daily.  . hydrochlorothiazide (HYDRODIURIL) 12.5 MG tablet TAKE ONE TABLET BY MOUTH DAILY  . hydrOXYzine (ATARAX/VISTARIL) 10 MG tablet TAKE ONE TABLET BY MOUTH THREE TIMES A DAY AS NEEDED  . lamoTRIgine (LAMICTAL) 150 MG tablet  Take 1 tablet (150 mg total) by mouth 2 (two) times daily.  . propranolol (INDERAL) 10 MG tablet Take 1 tablet (10 mg total) by mouth 4 (four) times daily as needed.  . traZODone (DESYREL) 100 MG tablet Take 1 tablet (100 mg total) by mouth at bedtime as needed for sleep.  . [DISCONTINUED] ALPRAZolam (XANAX) 0.5 MG tablet Take 1 tablet by mouth 2 (two) times daily as needed. For anxiety  . [DISCONTINUED] ondansetron (ZOFRAN) 4 MG tablet Take 1 tablet (4 mg total) by mouth every 8 (eight) hours as needed for nausea or vomiting.     Allergies:   Codeine, Erythromycin, and Ibuprofen   Social History   Socioeconomic History  . Marital status: Divorced    Spouse name: Not on file  . Number of children: Not on file  . Years of education: Not on file  . Highest education level: Not on file  Occupational History  . Not on file  Tobacco Use  . Smoking status: Never Smoker  . Smokeless tobacco: Never Used  Substance and Sexual Activity  . Alcohol use: No  . Drug use: No  . Sexual activity: Not on file  Other Topics Concern  . Not on file  Social History Narrative  . Not on  file   Social Determinants of Health   Financial Resource Strain: Not on file  Food Insecurity: Not on file  Transportation Needs: Not on file  Physical Activity: Not on file  Stress: Not on file  Social Connections: Not on file     Family History: The patient's family history includes Heart attack in her brother; Heart disease in her brother and father; Hyperlipidemia in her brother and father; Hypertension in her brother and father. There is no history of Breast cancer.  ROS:   Please see the history of present illness.     All other systems reviewed and are negative.  EKGs/Labs/Other Studies Reviewed:    The following studies were reviewed today:   EKG:   December 07, 2020: Normal sinus rhythm with premature ventricular contractions.  No ST or T wave changes.  Recent Labs: 11/08/2020: ALT 32; BUN 10;  Creatinine, Ser 0.84; Hemoglobin 14.1; Platelets 403; Potassium 4.3; Sodium 134  Recent Lipid Panel    Component Value Date/Time   CHOL 268 (H) 11/08/2020 1423   TRIG 175 (H) 11/08/2020 1423   HDL 93 11/08/2020 1423   CHOLHDL 2.9 11/08/2020 1423   LDLCALC 145 (H) 11/08/2020 1423     Risk Assessment/Calculations:       Physical Exam:    VS:  BP 122/72   Pulse 67   Ht 5\' 4"  (1.626 m)   Wt 188 lb 9.6 oz (85.5 kg)   SpO2 98%   BMI 32.37 kg/m     Wt Readings from Last 3 Encounters:  12/07/20 188 lb 9.6 oz (85.5 kg)  02/17/20 175 lb (79.4 kg)  09/09/19 170 lb 12.8 oz (77.5 kg)     GEN:  Well nourished, well developed in no acute distress HEENT: Normal NECK: No JVD; No carotid bruits LYMPHATICS: No lymphadenopathy CARDIAC: RR , soft systolic murmur  RESPIRATORY:  Clear to auscultation without rales, wheezing or rhonchi  ABDOMEN: Soft, non-tender, non-distended MUSCULOSKELETAL:  No edema; No deformity  SKIN: Warm and dry NEUROLOGIC:  Alert and oriented x 3 PSYCHIATRIC:  Normal affect   ASSESSMENT:    1. Palpitations   2. Mixed hyperlipidemia    PLAN:    In order of problems listed above:  1. Palpitations: Hope presents with palpitations.  These palpitations have worsened over the past year.  They seem to be related to stress.  She has had them for many years.  They used to be improved when she was on a good exercise program.  Over the past year or so she has not been exercising quite as much because of stress and her job.  I encouraged her to get back into her exercise regimen.  We will give her propranolol 10 mg to take on an as-needed basis.  She will have her thyroid checked by her primary medical doctor.  I will see her again in 6 months to see how she is doing.  We will check lipids, liver enzymes, basic metabolic profile at that time if she is not already had them at her medical doctor.  2.  Hyperlipidemia: Her triglyceride level and LDL are mildly elevated.   She will work on improved diet and exercise and weight loss program.  We will recheck them in 6 months.        Medication Adjustments/Labs and Tests Ordered: Current medicines are reviewed at length with the patient today.  Concerns regarding medicines are outlined above.  Orders Placed This Encounter  Procedures  . Hepatic function  panel  . Lipid panel  . Basic metabolic panel  . EKG 12-Lead   Meds ordered this encounter  Medications  . propranolol (INDERAL) 10 MG tablet    Sig: Take 1 tablet (10 mg total) by mouth 4 (four) times daily as needed.    Dispense:  90 tablet    Refill:  3    Patient Instructions  Medication Instructions:  Your physician has recommended you make the following change in your medication:  1.) propranolol 10 mg - take one tablet by mouth every 6 hours as needed for palpitations  *If you need a refill on your cardiac medications before your next appointment, please call your pharmacy*   Lab Work: In 6 months before your follow up visit, unless done by your PCP (lipids, liver function, basic metabolic panel)   Testing/Procedures: none   Follow-Up: At [facility], you and your health needs are our priority.  As part of our continuing mission to provide you with exceptional heart care, we have created designated Provider Care Teams.  These Care Teams include your primary Cardiologist (physician) and Advanced Practice Providers (APPs -  Physician Assistants and Nurse Practitioners) who all work together to provide you with the care you need, when you need it.  We recommend signing up for the patient portal called "MyChart".  Sign up information is provided on this After Visit Summary.  MyChart is used to connect with patients for Virtual Visits (Telemedicine).  Patients are able to view lab/test results, encounter notes, upcoming appointments, etc.  Non-urgent messages can be sent to your provider as well.   To learn more about what you can do with  MyChart, go to [email or web address].    Your next appointment:   6 month(s)  The format for your next appointment:   In Person  Provider:   Mertie Moores, MD   Other Instructions: Labs prior to next visit, unless done by your PCP. (bmet, lipids/liver function)     Signed, Mertie Moores, MD  12/07/2020 6:12 PM    [facility]

## 2020-12-13 ENCOUNTER — Ambulatory Visit: Payer: 59 | Admitting: Family Medicine

## 2020-12-20 ENCOUNTER — Ambulatory Visit: Payer: Self-pay | Admitting: Cardiovascular Disease

## 2020-12-20 ENCOUNTER — Ambulatory Visit: Payer: 59 | Admitting: Family Medicine

## 2021-01-02 ENCOUNTER — Other Ambulatory Visit: Payer: Self-pay | Admitting: Family Medicine

## 2021-01-02 ENCOUNTER — Other Ambulatory Visit: Payer: Self-pay | Admitting: Physician Assistant

## 2021-01-02 MED ORDER — HYDROXYZINE HCL 10 MG PO TABS: 10.0000 mg | ORAL_TABLET | Freq: Three times a day (TID) | ORAL | 0 refills | Status: DC | PRN

## 2021-01-02 NOTE — Telephone Encounter (Signed)
Requested medication (s) are due for refill today: yes  Requested medication (s) are on the active medication list:yes  Last refill:  11/17/20  Future visit scheduled: yes  Notes to clinic:  not delegated    Requested Prescriptions  Pending Prescriptions Disp Refills   lamoTRIgine (LAMICTAL) 150 MG tablet [Pharmacy Med Name: lamoTRIgine 150 MG TABLET] 60 tablet 1    Sig: TAKE ONE TABLET BY MOUTH TWICE A DAY      Not Delegated - Neurology:  Anticonvulsants Failed - 01/02/2021 10:40 AM      Failed - This refill cannot be delegated      Passed - HCT in normal range and within 360 days    Hematocrit  Date Value Ref Range Status  11/08/2020 40.2 34.0 - 46.6 % Final          Passed - HGB in normal range and within 360 days    Hemoglobin  Date Value Ref Range Status  11/08/2020 14.1 11.1 - 15.9 g/dL Final          Passed - PLT in normal range and within 360 days    Platelets  Date Value Ref Range Status  11/08/2020 403 150 - 450 x10E3/uL Final          Passed - WBC in normal range and within 360 days    WBC  Date Value Ref Range Status  11/08/2020 6.1 3.4 - 10.8 x10E3/uL Final          Passed - Valid encounter within last 12 months    Recent Outpatient Visits           5 months ago Bipolar disorder, current episode mixed, moderate ([place])   [place], Dionne Bucy, MD   10 months ago Encounter for annual physical exam   [facility] [place], Dionne Bucy, MD   1 year ago Contusion of lesser toe of left foot without damage to nail, initial encounter   [facility], Dionne Bucy, MD   2 years ago Encounter for annual physical exam   [facility] [place], Dionne Bucy, MD   2 years ago Tennis Must Quervain's tenosynovitis   [facility], Wendee Beavers, [place]       Future Appointments             In 2 months Pollak, Adriana M, PA-C [employer], [facility]               Signed Prescriptions Disp Refills   traZODone (DESYREL) 100 MG tablet 30 tablet 1    Sig: TAKE ONE TABLET BY MOUTH EVERY NIGHT AT BEDTIME AS NEEDED FOR SLEEP      Psychiatry: Antidepressants - Serotonin Modulator Passed - 01/02/2021 10:40 AM      Passed - Completed PHQ-2 or PHQ-9 in the last 360 days      Passed - Valid encounter within last 6 months    Recent Outpatient Visits           5 months ago Bipolar disorder, current episode mixed, moderate ([place])   [place], Dionne Bucy, MD   10 months ago Encounter for annual physical exam   [facility] [place], Dionne Bucy, MD   1 year ago Contusion of lesser toe of left foot without damage to nail, initial encounter   [place], Dionne Bucy, MD   2 years ago Encounter for annual physical exam   [facility] Lavon Paganini M,  MD   2 years ago Harriet Pho tenosynovitis   [facility] [place], Wendee Beavers, [place]       Future Appointments             In 2 months Terrilee Croak, Wendee Beavers, PA-C [employer], [place]

## 2021-01-02 NOTE — Telephone Encounter (Signed)
Requested Prescriptions  Pending Prescriptions Disp Refills  . lamoTRIgine (LAMICTAL) 150 MG tablet [Pharmacy Med Name: lamoTRIgine 150 MG TABLET] 60 tablet 1    Sig: TAKE ONE TABLET BY MOUTH TWICE A DAY     Not Delegated - Neurology:  Anticonvulsants Failed - 01/02/2021 10:40 AM      Failed - This refill cannot be delegated      Passed - HCT in normal range and within 360 days    Hematocrit  Date Value Ref Range Status  11/08/2020 40.2 34.0 - 46.6 % Final         Passed - HGB in normal range and within 360 days    Hemoglobin  Date Value Ref Range Status  11/08/2020 14.1 11.1 - 15.9 g/dL Final         Passed - PLT in normal range and within 360 days    Platelets  Date Value Ref Range Status  11/08/2020 403 150 - 450 x10E3/uL Final         Passed - WBC in normal range and within 360 days    WBC  Date Value Ref Range Status  11/08/2020 6.1 3.4 - 10.8 x10E3/uL Final         Passed - Valid encounter within last 12 months    Recent Outpatient Visits          5 months ago Bipolar disorder, current episode mixed, moderate ([place])   [place], Dionne Bucy, MD   10 months ago Encounter for annual physical exam   [facility] [place], Dionne Bucy, MD   1 year ago Contusion of lesser toe of left foot without damage to nail, initial encounter   [facility] [place], Dionne Bucy, MD   2 years ago Encounter for annual physical exam   [facility] [place], Dionne Bucy, MD   2 years ago Tennis Must Quervain's tenosynovitis   [facility] [place], Wendee Beavers, [place]      Future Appointments            In 2 months Pollak, Wendee Beavers, PA-C [employer], [facility]           . traZODone (DESYREL) 100 MG tablet [employer] Med Name: traZODone 100 MG TABLET] 30 tablet 1    Sig: TAKE ONE TABLET BY MOUTH EVERY NIGHT AT BEDTIME AS NEEDED FOR SLEEP     Psychiatry: Antidepressants - Serotonin Modulator Passed -  01/02/2021 10:40 AM      Passed - Completed PHQ-2 or PHQ-9 in the last 360 days      Passed - Valid encounter within last 6 months    Recent Outpatient Visits          5 months ago Bipolar disorder, current episode mixed, moderate ([place])   [place], Dionne Bucy, MD   10 months ago Encounter for annual physical exam   [facility] [place], Dionne Bucy, MD   1 year ago Contusion of lesser toe of left foot without damage to nail, initial encounter   [place], Dionne Bucy, MD   2 years ago Encounter for annual physical exam   [facility] [place], Dionne Bucy, MD   2 years ago Tennis Must Quervain's tenosynovitis   [facility] [place], Wendee Beavers, [place]      Future Appointments            In 2 months Terrilee Croak, Wendee Beavers, [place], [place]

## 2021-01-02 NOTE — Telephone Encounter (Signed)
Medication Refill - Medication: Hydroxyzine 10mg   Has the patient contacted their pharmacy? Yes.   (Agent: If no, request that the patient contact the pharmacy for the refill.) (Agent: If yes, when and what did the pharmacy advise?)  Preferred Pharmacy (with phone number or street name): [place], [place]  Agent: Please be advised that RX refills may take up to 3 business days. We ask that you follow-up with your pharmacy.

## 2021-01-02 NOTE — Telephone Encounter (Signed)
Requested Prescriptions  Pending Prescriptions Disp Refills  . hydrochlorothiazide (HYDRODIURIL) 12.5 MG tablet [Pharmacy Med Name: hydroCHLOROthiazide 12.5 MG TABLET] 90 tablet 0    Sig: TAKE ONE TABLET BY MOUTH DAILY     Cardiovascular: Diuretics - Thiazide Passed - 01/02/2021 10:40 AM      Passed - Ca in normal range and within 360 days    Calcium  Date Value Ref Range Status  11/08/2020 10.1 8.7 - 10.2 mg/dL Final         Passed - Cr in normal range and within 360 days    Creatinine, Ser  Date Value Ref Range Status  11/08/2020 0.84 0.57 - 1.00 mg/dL Final         Passed - K in normal range and within 360 days    Potassium  Date Value Ref Range Status  11/08/2020 4.3 3.5 - 5.2 mmol/L Final         Passed - Na in normal range and within 360 days    Sodium  Date Value Ref Range Status  11/08/2020 134 134 - 144 mmol/L Final         Passed - Last BP in normal range    BP Readings from Last 1 Encounters:  12/07/20 122/72         Passed - Valid encounter within last 6 months    Recent Outpatient Visits          5 months ago Bipolar disorder, current episode mixed, moderate ([place])   [place], Dionne Bucy, MD   10 months ago Encounter for annual physical exam   [facility] [place], Dionne Bucy, MD   1 year ago Contusion of lesser toe of left foot without damage to nail, initial encounter   [place], Dionne Bucy, MD   2 years ago Encounter for annual physical exam   [facility] [place], Dionne Bucy, MD   2 years ago Tennis Must Quervain's tenosynovitis   [facility] [place], Wendee Beavers, [place]      Future Appointments            In 2 months Terrilee Croak, Wendee Beavers, [place], [place]

## 2021-01-04 ENCOUNTER — Telehealth: Payer: Self-pay

## 2021-01-04 NOTE — Telephone Encounter (Signed)
Noted duplicate done message.

## 2021-01-04 NOTE — Telephone Encounter (Signed)
Patient requesting lab to check for varicella titer. 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 Please advise.

## 2021-01-04 NOTE — Telephone Encounter (Signed)
Copied from [place] [id number]. Topic: General - Other >> Jan 04, 2021  2:04 PM Celene Kras wrote: Reason for CRM: Pt called and is requesting to have a varicella tighter. Pt is requesting to have a call back today. Please advise.

## 2021-01-04 NOTE — Telephone Encounter (Signed)
Per [facility] patient had lab test done on 05/05/2013 which shows history of varicella disease. Patient advise, she reports she will call the health department to ask for a copy of the lab. Patient advised if they are unable to find copy to give [name] a call to have lab done again. Kathleen Dyke, NP aware.

## 2021-01-04 NOTE — Telephone Encounter (Signed)
Yes noted  Thanks

## 2021-01-07 ENCOUNTER — Ambulatory Visit: Payer: Self-pay

## 2021-01-07 ENCOUNTER — Telehealth: Payer: Self-pay

## 2021-01-07 NOTE — Telephone Encounter (Signed)
On 01/04/2021, RN received message from K. [place] to contact pt re: varicella titer results. Phone call to pt today who states she needs copy of varicella titer results that were done on 05/05/2013. She needs quantitative results as required by her employer. She works as Therapist, sports. Reviewed pt's paper chart, quantitative varicella results found which show immunity, copy made. Pt to come to [facility] 01/09/2021 to sign ROI and pick up results. Pt to arrive before 12 noon. Josie Saunders, RN

## 2021-01-09 ENCOUNTER — Ambulatory Visit: Payer: Self-pay

## 2021-01-11 ENCOUNTER — Other Ambulatory Visit: Payer: Self-pay

## 2021-01-11 ENCOUNTER — Ambulatory Visit (LOCAL_COMMUNITY_HEALTH_CENTER): Payer: 59

## 2021-01-11 DIAGNOSIS — Z7185 Encounter for immunization safety counseling: Secondary | ICD-10-CM

## 2021-01-11 NOTE — Progress Notes (Signed)
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 Varicella titer results located and show immunity. No vaccine indicated. ROI signed and copy given to pt. Copy of titer results sent for scanning. Josie Saunders, RN

## 2021-01-21 ENCOUNTER — Other Ambulatory Visit: Payer: Self-pay | Admitting: Family Medicine

## 2021-01-21 ENCOUNTER — Ambulatory Visit
Admission: RE | Admit: 2021-01-21 | Discharge: 2021-01-21 | Disposition: A | Discharge status: Home / Self Care | Payer: 59 | Attending: Family Medicine | Admitting: Family Medicine

## 2021-01-21 ENCOUNTER — Other Ambulatory Visit: Payer: Self-pay

## 2021-01-21 ENCOUNTER — Ambulatory Visit (INDEPENDENT_AMBULATORY_CARE_PROVIDER_SITE_OTHER): Payer: 59 | Admitting: Family Medicine

## 2021-01-21 ENCOUNTER — Ambulatory Visit
Admission: RE | Admit: 2021-01-21 | Discharge: 2021-01-21 | Disposition: A | Discharge status: Home / Self Care | Payer: 59 | Source: Ambulatory Visit | Attending: Family Medicine | Admitting: Family Medicine

## 2021-01-21 ENCOUNTER — Telehealth: Payer: Self-pay

## 2021-01-21 DIAGNOSIS — R509 Fever, unspecified: Secondary | ICD-10-CM | POA: Diagnosis present

## 2021-01-21 DIAGNOSIS — R059 Cough, unspecified: Secondary | ICD-10-CM

## 2021-01-21 DIAGNOSIS — J0140 Acute pansinusitis, unspecified: Secondary | ICD-10-CM | POA: Diagnosis not present

## 2021-01-21 DIAGNOSIS — F418 Other specified anxiety disorders: Secondary | ICD-10-CM

## 2021-01-21 MED ORDER — DOXYCYCLINE HYCLATE 100 MG PO TABS: 100.0000 mg | ORAL_TABLET | Freq: Two times a day (BID) | ORAL | 0 refills | Status: AC

## 2021-01-21 NOTE — Progress Notes (Signed)
Virtual telephone visit    Virtual Visit via Telephone Note   This visit type was conducted due to national recommendations for restrictions regarding the COVID-19 Pandemic (e.g. social distancing) in an effort to limit this patient's exposure and mitigate transmission in our community. Due to her co-morbid illnesses, this patient is at least at moderate risk for complications without adequate follow up. This format is felt to be most appropriate for this patient at this time. The patient did not have access to video technology or had technical difficulties with video requiring transitioning to audio format only (telephone). Physical exam was limited to content and character of the telephone converstion.     Patient location: home Provider location: [place] involved in the visit: patient, provider   I discussed the limitations of evaluation and management by telemedicine and the availability of in person appointments. The patient expressed understanding and agreed to proceed.   Visit Date: 01/21/2021  Today's healthcare provider: Lavon Paganini, MD   Chief Complaint  Patient presents with  . URI   Subjective    HPI HPI    URI    URI symptoms: congestion, chills, cough, shortness of breath and sinus pain   Onset: in the past 7 days   Progression since onset: rapidly worsening since onset   Hydration: is drinking plenty of fluids          Comments    Patient reports she had COVID last month. Patient reports symptoms are worse at night time. Patient reports fever at night. She reports taking Tylenol PM, Mucinex, and Nyquil.        Last edited by Dorian Pod, CMA on 01/21/2021  9:48 AM. (History)     Symptoms have persisted since having COVID and now sinus pressure and cough are worsening.  Also has fever. Worse at night.  Patient Active Problem List   Diagnosis Date Noted  . Hyperlipidemia 11/12/2018  . B12 deficiency  11/12/2018  . Avitaminosis D 11/12/2018  . Overweight 11/12/2018  . Chronic cervical pain 02/25/2017  . History of melanoma 02/25/2017  . Mitral valve prolapse 02/25/2017  . Bipolar affective ([place]) 11/26/2016  . Anxiety and depression 06/01/2015  . Arrhythmia 06/01/2015  . Shortness of breath 06/01/2015   Social History   Tobacco Use  . Smoking status: Never Smoker  . Smokeless tobacco: Never Used  Substance Use Topics  . Alcohol use: No  . Drug use: No   Allergies  Allergen Reactions  . Codeine Nausea Only  . Erythromycin Nausea Only  . Nsaids   . Ibuprofen Nausea Only      Medications: Outpatient Medications Prior to Visit  Medication Sig  . atenolol (TENORMIN) 25 MG tablet Take 1 tablet (25 mg total) by mouth 2 (two) times daily as needed.  . clonazePAM (KLONOPIN) 0.5 MG tablet Take 1 tablet (0.5 mg total) by mouth 2 (two) times daily.  . hydrochlorothiazide (HYDRODIURIL) 12.5 MG tablet TAKE ONE TABLET BY MOUTH DAILY  . hydrOXYzine (ATARAX/VISTARIL) 10 MG tablet Take 1 tablet (10 mg total) by mouth 3 (three) times daily as needed.  . lamoTRIgine (LAMICTAL) 150 MG tablet TAKE ONE TABLET BY MOUTH TWICE A DAY  . propranolol (INDERAL) 10 MG tablet Take 1 tablet (10 mg total) by mouth 4 (four) times daily as needed.  . traZODone (DESYREL) 100 MG tablet TAKE ONE TABLET BY MOUTH EVERY NIGHT AT BEDTIME AS NEEDED FOR SLEEP   No facility-administered medications prior to visit.  Review of Systems  Constitutional: Positive for activity change, chills and fever.  HENT: Positive for congestion, postnasal drip, rhinorrhea, sinus pressure and sinus pain.   Respiratory: Positive for cough and shortness of breath.     Last CBC Lab Results  Component Value Date   WBC 6.1 11/08/2020   HGB 14.1 11/08/2020   HCT 40.2 11/08/2020   MCV 89 11/08/2020   MCH 31.3 11/08/2020   RDW 11.7 11/08/2020   PLT 403 11/08/2020      Objective    There were no vitals taken for this  visit. BP Readings from Last 3 Encounters:  12/07/20 122/72  02/17/20 (!) 151/88  09/09/19 (!) 178/89     Speaking in full sentences without acute distress   Assessment & Plan     1. Acute non-recurrent pansinusitis - symptoms and exam c/w sinusitis   - no evidence of AOM, CAP, strep pharyngitis, or other infection - given duration of symptoms, suspect bacterial etiology - will treat with doxycycline x7d - discussed symptomatic management (flonase, decongestants, etc), natural course, and return precautions    2. Cough 3. Fever, unspecified fever cause - concern for possible CAP after recent COVID infection and now with worsening respiratory symptoms and new fever - will send for OP CXR - breathing comfortably without respiratory distress - will already be taking doxycycline for sinusitis above and this should cover CAP as well - return precautions discussed - DG Chest 2 View; Future   No follow-ups on file.    I discussed the assessment and treatment plan with the patient. The patient was provided an opportunity to ask questions and all were answered. The patient agreed with the plan and demonstrated an understanding of the instructions.   The patient was advised to call back or seek an in-person evaluation if the symptoms worsen or if the condition fails to improve as anticipated.  I provided 12 minutes of non-face-to-face time during this encounter.  I, Lavon Paganini, MD, have reviewed all documentation for this visit. The documentation on 01/21/21 for the exam, diagnosis, procedures, and orders are all accurate and complete.   Evalise Abruzzese, Dionne Bucy, MD, MPH [facility]

## 2021-01-21 NOTE — Patient Instructions (Signed)

## 2021-01-21 NOTE — Telephone Encounter (Signed)
Pt called and verbalized understanding of information below. 

## 2021-01-21 NOTE — Telephone Encounter (Signed)
Requested medication (s) are due for refill today: yes  Requested medication (s) are on the active medication list: yes  Last refill: 12/07/20  Future visit scheduled: yes  Notes to clinic: not delegated    Requested Prescriptions  Pending Prescriptions Disp Refills   clonazePAM (KLONOPIN) 0.5 MG tablet [Pharmacy Med Name: clonazePAM 0.5 MG TABLET] 30 tablet     Sig: TAKE ONE TABLET BY MOUTH TWICE A DAY      Not Delegated - Psychiatry:  Anxiolytics/Hypnotics Failed - 01/21/2021 11:01 AM      Failed - This refill cannot be delegated      Failed - Urine Drug Screen completed in last 360 days      Failed - Valid encounter within last 6 months    Recent Outpatient Visits           Today Acute non-recurrent pansinusitis   [facility] [place], Dionne Bucy, MD   6 months ago Bipolar disorder, current episode mixed, moderate ([place])   [facility], [facility], MD   11 months ago Encounter for annual physical exam   [facility] [place], Dionne Bucy, MD   1 year ago Contusion of lesser toe of left foot without damage to nail, initial encounter   [facility] [place], Dionne Bucy, MD   2 years ago Encounter for annual physical exam   [facility] [place], Dionne Bucy, MD       Future Appointments             In 1 month Terrilee Croak, Wendee Beavers, PA-C [employer], [facility]

## 2021-01-21 NOTE — Telephone Encounter (Signed)
-----   Message from Virginia Crews, MD sent at 01/21/2021 12:54 PM EST ----- Negative chest XRay. No pneumonia

## 2021-01-28 ENCOUNTER — Other Ambulatory Visit: Payer: Self-pay

## 2021-01-28 NOTE — Telephone Encounter (Signed)
[place] faxed refill request for the following medications:  hydrochlorothiazide (HYDRODIURIL) 12.5 MG tablet  ondansetron (ZOFRAN) 4 MG tablet   Please advise.

## 2021-01-29 MED ORDER — HYDROCHLOROTHIAZIDE 12.5 MG PO TABS: 12.5000 mg | ORAL_TABLET | Freq: Every day | ORAL | 1 refills | Status: DC

## 2021-01-29 MED ORDER — ONDANSETRON HCL 4 MG PO TABS: 4.0000 mg | ORAL_TABLET | Freq: Three times a day (TID) | ORAL | 0 refills | Status: DC | PRN

## 2021-01-29 NOTE — Addendum Note (Signed)
Addended by: Althea Charon D on: 01/29/2021 09:18 AM   Modules accepted: Orders

## 2021-02-23 ENCOUNTER — Other Ambulatory Visit: Payer: Self-pay | Admitting: Family Medicine

## 2021-02-23 DIAGNOSIS — F418 Other specified anxiety disorders: Secondary | ICD-10-CM

## 2021-02-25 ENCOUNTER — Telehealth: Payer: Self-pay

## 2021-02-25 NOTE — Telephone Encounter (Signed)
LOV 01/21/21

## 2021-02-25 NOTE — Telephone Encounter (Signed)
Copied from [employer] [id number]. Topic: Appointment Scheduling - Scheduling Inquiry for Clinic >> Feb 25, 2021  2:08 PM Oneta Rack wrote: Reason for CRM: patient was originally scheduled with Carles Collet on 03/13/2021 for her physical. Patient would like to see Dr. Brita Romp only and would like a 10am or 11am due to her work schedule. Patient states she feels fine but would like to know if PCP would work her in.

## 2021-02-25 NOTE — Telephone Encounter (Signed)
Requested medication (s) are due for refill today: yes   Requested medication (s) are on the active medication list: yes   Last refill: 12/06/2020  Future visit scheduled: yes  Notes to clinic: this refill cannot be delegated    Requested Prescriptions  Pending Prescriptions Disp Refills   lamoTRIgine (LAMICTAL) 100 MG tablet [Pharmacy Med Name: lamoTRIgine 100 MG TABLET] 180 tablet 3    Sig: TAKE ONE TABLET BY MOUTH TWICE A DAY      Not Delegated - Neurology:  Anticonvulsants Failed - 02/23/2021  5:02 PM      Failed - This refill cannot be delegated      Passed - HCT in normal range and within 360 days    Hematocrit  Date Value Ref Range Status  11/08/2020 40.2 34.0 - 46.6 % Final          Passed - HGB in normal range and within 360 days    Hemoglobin  Date Value Ref Range Status  11/08/2020 14.1 11.1 - 15.9 g/dL Final          Passed - PLT in normal range and within 360 days    Platelets  Date Value Ref Range Status  11/08/2020 403 150 - 450 x10E3/uL Final          Passed - WBC in normal range and within 360 days    WBC  Date Value Ref Range Status  11/08/2020 6.1 3.4 - 10.8 x10E3/uL Final          Passed - Valid encounter within last 12 months    Recent Outpatient Visits           1 month ago Acute non-recurrent pansinusitis   [facility] [place], Dionne Bucy, MD   7 months ago Bipolar disorder, current episode mixed, moderate ([place])   [facility], Dionne Bucy, MD   1 year ago Encounter for annual physical exam   [facility] [place], Dionne Bucy, MD   1 year ago Contusion of lesser toe of left foot without damage to nail, initial encounter   [facility], [facility] Bucy, MD   2 years ago Encounter for annual physical exam   [facility], [facility] Bucy, MD       Future Appointments             In 2 weeks Terrilee Croak, Wendee Beavers, [place], [place]    In 3 months Bacigalupo, Dionne Bucy, MD [facility] [place], [facility]               traZODone (DESYREL) 100 MG tablet [Pharmacy Med Name: traZODone 100 MG TABLET] 30 tablet 1    Sig: TAKE ONE TABLET BY MOUTH EVERY NIGHT AT BEDTIME AS NEEDED FOR SLEEP      Psychiatry: Antidepressants - Serotonin Modulator Failed - 02/23/2021  5:02 PM      Failed - Valid encounter within last 6 months    Recent Outpatient Visits           1 month ago Acute non-recurrent pansinusitis   [facility] [place], Dionne Bucy, MD   7 months ago Bipolar disorder, current episode mixed, moderate [facility])   [facility] [place], Dionne Bucy, MD   1 year ago Encounter for annual physical exam   [facility] Virginia Crews, MD   1 year ago Contusion of lesser toe of left foot without damage to nail, initial encounter   [facility]  Virginia Crews, MD   2 years ago Encounter for annual physical exam   [facility], Dionne Bucy, MD       Future Appointments             In 2 weeks Trinna Post, PA-C [employer], [place]   In 3 months Bacigalupo, Dionne Bucy, MD [facility], [place] - Completed PHQ-2 or PHQ-9 in the last 360 days        lamoTRIgine (LAMICTAL) 150 MG tablet [Pharmacy Med Name: lamoTRIgine 150 MG TABLET] 60 tablet 1    Sig: TAKE ONE TABLET BY MOUTH TWICE A DAY      Not Delegated - Neurology:  Anticonvulsants Failed - 02/23/2021  5:02 PM      Failed - This refill cannot be delegated      Passed - HCT in normal range and within 360 days    Hematocrit  Date Value Ref Range Status  11/08/2020 40.2 34.0 - 46.6 % Final          Passed - HGB in normal range and within 360 days    Hemoglobin  Date Value Ref Range Status  11/08/2020 14.1 11.1 - 15.9 g/dL Final          Passed - PLT in normal range and within 360 days    Platelets  Date Value Ref Range  Status  11/08/2020 403 150 - 450 x10E3/uL Final          Passed - WBC in normal range and within 360 days    WBC  Date Value Ref Range Status  11/08/2020 6.1 3.4 - 10.8 x10E3/uL Final          Passed - Valid encounter within last 12 months    Recent Outpatient Visits           1 month ago Acute non-recurrent pansinusitis   [facility] [place], Dionne Bucy, MD   7 months ago Bipolar disorder, current episode mixed, moderate ([place])   [facility], Dionne Bucy, MD   1 year ago Encounter for annual physical exam   [facility] [place], Dionne Bucy, MD   1 year ago Contusion of lesser toe of left foot without damage to nail, initial encounter   [facility], Dionne Bucy, MD   2 years ago Encounter for annual physical exam   [facility], Dionne Bucy, MD       Future Appointments             In 2 weeks Trinna Post, [place], [place]   In 3 months Bacigalupo, Dionne Bucy, MD [facility], [facility]               clonazePAM (KLONOPIN) 0.5 MG tablet [Pharmacy Med Name: clonazePAM 0.5 MG TABLET] 30 tablet     Sig: PLACE ONE TABLET BY MOUTH TWICE A DAY      Not Delegated - Psychiatry:  Anxiolytics/Hypnotics Failed - 02/23/2021  5:02 PM      Failed - This refill cannot be delegated      Failed - Urine Drug Screen completed in last 360 days      Failed - Valid encounter within last 6 months    Recent Outpatient Visits           1 month ago Acute non-recurrent pansinusitis   [facility] Bacigalupo, Dionne Bucy, MD   7 months ago Bipolar disorder, current episode mixed, moderate [facility])   [facility] [place], Dionne Bucy, MD   1 year ago Encounter for annual physical exam   [facility] [place], Dionne Bucy, MD   1 year ago Contusion of lesser toe of left foot without damage to nail, initial encounter    [facility], Dionne Bucy, MD   2 years ago Encounter for annual physical exam   [facility] Dionne Bucy, MD       Future Appointments             In 2 weeks Trinna Post, [place], [place]   In 3 months Bacigalupo, Dionne Bucy, MD [facility], [place]

## 2021-02-26 NOTE — Telephone Encounter (Signed)
Can we see if she is established with Psych?  Last I knew, [facility]. If so, they need to be prescribing these meds.

## 2021-02-27 NOTE — Telephone Encounter (Signed)
NA. Voice mail is full. Called [facility] and left voice mail.

## 2021-02-27 NOTE — Telephone Encounter (Signed)
Patient reports that she did not establish care with [facility]. Patient reports she got sick with Covid and lost her job making her lose her insurance. Patient reports that she is not want to establish with psychiatrist feels like she is managed well on her medications. 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 Patient reports her boyfriend has moved in with her and is very happy. Patient reports that if PCP is not willing to manage medications she will look for a different provider due to want wanting to establish with psych. Please advise.

## 2021-02-28 NOTE — Telephone Encounter (Signed)
We discussed previously that I would temporarily fill until she was seen by Psych. I think most PCPs would like her to be followed by Psych, even though well controlled, while on mood stabilizer.  I will give one month supply to hold her over to establish with someone.

## 2021-03-13 ENCOUNTER — Encounter: Payer: 59 | Admitting: Physician Assistant

## 2021-03-13 ENCOUNTER — Ambulatory Visit: Payer: 59

## 2021-03-13 ENCOUNTER — Telehealth: Payer: Self-pay

## 2021-03-13 NOTE — Telephone Encounter (Signed)
Copied from [place] [id number]. Topic: General - Other >> Mar 12, 2021  4:59 PM Tessa Lerner A wrote: Reason for CRM: Patient called to notify PCP of their continued arm and shoulder discomfort  Patient has cancelled their appointment for 03/14/21 because they were uncertain if they would be able to be prescribed anything for their discomfort  Patient will be traveling internationally Friday 03/15/21 and would like to be prescribed something for their discomfort  Please contact to advise further when possible

## 2021-03-13 NOTE — Telephone Encounter (Signed)
Patient needs evaluation in office or at an urgent care.

## 2021-03-14 ENCOUNTER — Ambulatory Visit: Payer: 59 | Admitting: Adult Health

## 2021-03-14 NOTE — Telephone Encounter (Signed)
lmtcb okay for [facility] triage to advise .KW

## 2021-03-14 NOTE — Telephone Encounter (Addendum)
Pt given message per Laverna Peace, "Patient needs evaluation in office or at an urgent care"; .she verbalized understanding and will go to urgent care.; will route to office for notification of encounter.

## 2021-03-28 ENCOUNTER — Other Ambulatory Visit: Payer: Self-pay | Admitting: Family Medicine

## 2021-03-28 DIAGNOSIS — I499 Cardiac arrhythmia, unspecified: Secondary | ICD-10-CM

## 2021-03-28 NOTE — Telephone Encounter (Signed)
Requested medication (s) are due for refill today: yes  Requested medication (s) are on the active medication list: yes  Last refill: 02/28/2021  Future visit scheduled: yes  Notes to clinic:  Patient has upcoming appt 05/2021 Review for refills  Last appt was canceled    Requested Prescriptions  Pending Prescriptions Disp Refills   lamoTRIgine (LAMICTAL) 100 MG tablet [Pharmacy Med Name: lamoTRIgine 100 MG TABLET] 60 tablet 0    Sig: TAKE ONE TABLET BY MOUTH TWICE A DAY      Not Delegated - Neurology:  Anticonvulsants Failed - 03/28/2021  9:41 AM      Failed - This refill cannot be delegated      Passed - HCT in normal range and within 360 days    Hematocrit  Date Value Ref Range Status  11/08/2020 40.2 34.0 - 46.6 % Final          Passed - HGB in normal range and within 360 days    Hemoglobin  Date Value Ref Range Status  11/08/2020 14.1 11.1 - 15.9 g/dL Final          Passed - PLT in normal range and within 360 days    Platelets  Date Value Ref Range Status  11/08/2020 403 150 - 450 x10E3/uL Final          Passed - WBC in normal range and within 360 days    WBC  Date Value Ref Range Status  11/08/2020 6.1 3.4 - 10.8 x10E3/uL Final          Passed - Valid encounter within last 12 months    Recent Outpatient Visits           2 months ago Acute non-recurrent pansinusitis   [facility] [place], Dionne Bucy, MD   8 months ago Bipolar disorder, current episode mixed, moderate ([place])   [facility], Dion[facility] Bucy, MD   1 year ago Encounter for annual physical exam   [facility] [place], Dionne Bucy, MD   1 year ago Contusion of lesser toe of left foot without damage to nail, initial encounter   [facility], Dionne Bucy, MD   2 years ago Encounter for annual physical exam   [facility] [place], Dionne Bucy, MD       Future Appointments             In 2 months  Bacigalupo, Dionne Bucy, MD [facility], [facility]               traZODone (DESYREL) 100 MG tablet [Pharmacy Med Name: traZODone 100 MG TABLET] 30 tablet 0    Sig: TAKE ONE TABLET BY MOUTH EVERY NIGHT AT BEDTIME AS NEEDED FOR SLEEP      Psychiatry: Antidepressants - Serotonin Modulator Failed - 03/28/2021  9:41 AM      Failed - Valid encounter within last 6 months    Recent Outpatient Visits           2 months ago Acute non-recurrent pansinusitis   [facility] [place], Dionne Bucy, MD   8 months ago Bipolar disorder, current episode mixed, moderate ([place])   [facility] [place], Dionne Bucy, MD   1 year ago Encounter for annual physical exam   [facility] [place], Dionne Bucy, MD   1 year ago Contusion of lesser toe of left foot without damage to nail, initial encounter   [facility], Dionne Bucy, MD  2 years ago Encounter for annual physical exam   [facility] [place], Dionne Bucy, MD       Future Appointments             In 2 months Bacigalupo, Dionne Bucy, MD [facility], [place] - Completed PHQ-2 or PHQ-9 in the last 360 days        lamoTRIgine (LAMICTAL) 150 MG tablet [Pharmacy Med Name: lamoTRIgine 150 MG TABLET] 60 tablet 0    Sig: TAKE ONE TABLET BY MOUTH TWICE A DAY      Not Delegated - Neurology:  Anticonvulsants Failed - 03/28/2021  9:41 AM      Failed - This refill cannot be delegated      Passed - HCT in normal range and within 360 days    Hematocrit  Date Value Ref Range Status  11/08/2020 40.2 34.0 - 46.6 % Final          Passed - HGB in normal range and within 360 days    Hemoglobin  Date Value Ref Range Status  11/08/2020 14.1 11.1 - 15.9 g/dL Final          Passed - PLT in normal range and within 360 days    Platelets  Date Value Ref Range Status  11/08/2020 403 150 - 450 x10E3/uL Final          Passed - WBC in normal  range and within 360 days    WBC  Date Value Ref Range Status  11/08/2020 6.1 3.4 - 10.8 x10E3/uL Final          Passed - Valid encounter within last 12 months    Recent Outpatient Visits           2 months ago Acute non-recurrent pansinusitis   [facility] [place], Dionne Bucy, MD   8 months ago Bipolar disorder, current episode mixed, moderate ([place])   [facility], [facility], [facility]   1 year ago Encounter for annual physical exam   [facility] [place], Dionne Bucy, MD   1 year ago Contusion of lesser toe of left foot without damage to nail, initial encounter   [facility], Dionne Bucy, MD   2 years ago Encounter for annual physical exam   [facility], Dion[facility] Bucy, MD       Future Appointments             In 2 months Bacigalupo, Dionne Bucy, MD [facility], [facility]               atenolol (TENORMIN) 25 MG tablet [Pharmacy Med Name: ATENOLOL 25 MG TABLET] 180 tablet 3    Sig: TAKE ONE TABLET BY MOUTH TWICE A DAY AS NEEDED      Cardiovascular:  Beta Blockers Failed - 03/28/2021  9:41 AM      Failed - Valid encounter within last 6 months    Recent Outpatient Visits           2 months ago Acute non-recurrent pansinusitis   [facility] [place], Dionne Bucy, MD   8 months ago Bipolar disorder, current episode mixed, moderate [facility])   [facility] [place], Dionne Bucy, MD   1 year ago Encounter for annual physical exam   [facility] Virginia Crews, MD   1 year ago Contusion of lesser toe of left foot without damage  to nail, initial encounter   [facility], Dionne Bucy, MD   2 years ago Encounter for annual physical exam   [facility], Dionne Bucy, MD       Future Appointments             In 2 months Bacigalupo, Dionne Bucy, MD [facility], [place] BP in normal range    BP Readings from Last 1 Encounters:  12/07/20 122/72          Passed - Last Heart Rate in normal range    Pulse Readings from Last 1 Encounters:  12/07/20 67            hydrOXYzine (ATARAX/VISTARIL) 10 MG tablet [Pharmacy Med Name: hydrOXYzine HCL 10 MG TABLET] 30 tablet 0    Sig: TAKE ONE TABLET BY MOUTH THREE TIMES A DAY AS NEEDED      Ear, Nose, and Throat:  Antihistamines Passed - 03/28/2021  9:41 AM      Passed - Valid encounter within last 12 months    Recent Outpatient Visits           2 months ago Acute non-recurrent pansinusitis   [facility] [place], Dionne Bucy, MD   8 months ago Bipolar disorder, current episode mixed, moderate ([place])   [facility], Dionne Bucy, MD   1 year ago Encounter for annual physical exam   [facility] [place], Dionne Bucy, MD   1 year ago Contusion of lesser toe of left foot without damage to nail, initial encounter   [facility], Dionne Bucy, MD   2 years ago Encounter for annual physical exam   [facility] Bacigalupo, Dionne Bucy, MD       Future Appointments             In 2 months Bacigalupo, Dionne Bucy, MD [facility], [place]

## 2021-05-06 ENCOUNTER — Other Ambulatory Visit: Payer: Self-pay | Admitting: Cardiovascular Disease

## 2021-05-06 ENCOUNTER — Other Ambulatory Visit: Payer: Self-pay | Admitting: Family Medicine

## 2021-05-06 DIAGNOSIS — F418 Other specified anxiety disorders: Secondary | ICD-10-CM

## 2021-05-06 NOTE — Telephone Encounter (Signed)
Requested medication (s) are due for refill today: yes  Requested medication (s) are on the active medication list: yes  Last refill:  02/28/21  Future visit scheduled: yes  Notes to clinic:  not delegated   Requested Prescriptions  Pending Prescriptions Disp Refills   clonazePAM (KLONOPIN) 0.5 MG tablet [Pharmacy Med Name: clonazePAM 0.5 MG TABLET] 30 tablet     Sig: TAKE ONE TABLET BY MOUTH TWICE A DAY      Not Delegated - Psychiatry:  Anxiolytics/Hypnotics Failed - 05/06/2021 11:42 AM      Failed - This refill cannot be delegated      Failed - Urine Drug Screen completed in last 360 days      Failed - Valid encounter within last 6 months    Recent Outpatient Visits           3 months ago Acute non-recurrent pansinusitis   [facility] [place], Dionne Bucy, MD   9 months ago Bipolar disorder, current episode mixed, moderate ([place])   [facility], Dionne Bucy, MD   1 year ago Encounter for annual physical exam   [facility], Dionne Bucy, MD   1 year ago Contusion of lesser toe of left foot without damage to nail, initial encounter   [facility], Dionne Bucy, MD   2 years ago Encounter for annual physical exam   [facility], Dionne Bucy, MD       Future Appointments             In 1 month Bacigalupo, Dionne Bucy, MD [facility], [facility]              Signed Prescriptions Disp Refills   traZODone (DESYREL) 100 MG tablet 30 tablet 0    Sig: TAKE ONE TABLET BY MOUTH EVERY NIGHT AT BEDTIME AS NEEDED FOR SLEEP      Psychiatry: Antidepressants - Serotonin Modulator Failed - 05/06/2021 11:42 AM      Failed - Valid encounter within last 6 months    Recent Outpatient Visits           3 months ago Acute non-recurrent pansinusitis   [facility] [place], Dionne Bucy, MD   9 months ago Bipolar disorder, current episode mixed, moderate ([place])    [facility] [place], Dionne Bucy, MD   1 year ago Encounter for annual physical exam   [facility] [place], Dionne Bucy, MD   1 year ago Contusion of lesser toe of left foot without damage to nail, initial encounter   [facility] [place], Dionne Bucy, MD   2 years ago Encounter for annual physical exam   [facility] Bacigalupo, Dionne Bucy, MD       Future Appointments             In 1 month Bacigalupo, Dionne Bucy, MD [facility], [facility]             Passed - Completed PHQ-2 or PHQ-9 in the last 360 days

## 2021-05-06 NOTE — Telephone Encounter (Signed)
Requested Prescriptions  Pending Prescriptions Disp Refills  . traZODone (DESYREL) 100 MG tablet [Pharmacy Med Name: traZODone 100 MG TABLET] 30 tablet 0    Sig: TAKE ONE TABLET BY MOUTH EVERY NIGHT AT BEDTIME AS NEEDED FOR SLEEP     Psychiatry: Antidepressants - Serotonin Modulator Failed - 05/06/2021 11:42 AM      Failed - Valid encounter within last 6 months    Recent Outpatient Visits          3 months ago Acute non-recurrent pansinusitis   [facility] [place], Dionne Bucy, MD   9 months ago Bipolar disorder, current episode mixed, moderate ([place])   [facility], Dionne Bucy, MD   1 year ago Encounter for annual physical exam   [facility] [place], Dionne Bucy, MD   1 year ago Contusion of lesser toe of left foot without damage to nail, initial encounter   [facility], Dionne Bucy, MD   2 years ago Encounter for annual physical exam   [facility] Bacigal[place], Dionne Bucy, MD      Future Appointments            In 1 month Bacigalupo, Dionne Bucy, [facility]           Passed - Completed PHQ-2 or PHQ-9 in the last 360 days      . clonazePAM (KLONOPIN) 0.5 MG tablet [Pharmacy Med Name: clonazePAM 0.5 MG TABLET] 30 tablet     Sig: TAKE ONE TABLET BY MOUTH TWICE A DAY     Not Delegated - Psychiatry:  Anxiolytics/Hypnotics Failed - 05/06/2021 11:42 AM      Failed - This refill cannot be delegated      Failed - Urine Drug Screen completed in last 360 days      Failed - Valid encounter within last 6 months    Recent Outpatient Visits          3 months ago Acute non-recurrent pansinusitis   [facility] [place], Dionne Bucy, MD   9 months ago Bipolar disorder, current episode mixed, moderate ([place])   [facility] [place], Dionne Bucy, MD   1 year ago Encounter for annual physical exam   [facility] [place], Dionne Bucy, MD   1 year  ago Contusion of lesser toe of left foot without damage to nail, initial encounter   [facility], Dionne Bucy, MD   2 years ago Encounter for annual physical exam   [facility] Bacigalupo, Dionne Bucy, MD      Future Appointments            In 1 month Bacigalupo, Dionne Bucy, MD [facility], [place]

## 2021-05-06 NOTE — Telephone Encounter (Signed)
Requested medication (s) are due for refill today: yes   Requested medication (s) are on the active medication list: yes   Last refill:  03/28/2021  Future visit scheduled:yes   Notes to clinic: this refill cannot be delegated   Requested Prescriptions  Pending Prescriptions Disp Refills   lamoTRIgine (LAMICTAL) 150 MG tablet [Pharmacy Med Name: lamoTRIgine 150 MG TABLET] 60 tablet 0    Sig: TAKE ONE TABLET BY MOUTH TWICE A DAY      Not Delegated - Neurology:  Anticonvulsants Failed - 05/06/2021 11:43 AM      Failed - This refill cannot be delegated      Passed - HCT in normal range and within 360 days    Hematocrit  Date Value Ref Range Status  11/08/2020 40.2 34.0 - 46.6 % Final          Passed - HGB in normal range and within 360 days    Hemoglobin  Date Value Ref Range Status  11/08/2020 14.1 11.1 - 15.9 g/dL Final          Passed - PLT in normal range and within 360 days    Platelets  Date Value Ref Range Status  11/08/2020 403 150 - 450 x10E3/uL Final          Passed - WBC in normal range and within 360 days    WBC  Date Value Ref Range Status  11/08/2020 6.1 3.4 - 10.8 x10E3/uL Final          Passed - Valid encounter within last 12 months    Recent Outpatient Visits           3 months ago Acute non-recurrent pansinusitis   [facility] [place], Dionne Bucy, MD   9 months ago Bipolar disorder, current episode mixed, moderate ([place])   [place], Dionne Bucy, MD   1 year ago Encounter for annual physical exam   [facility] [place], Dionne Bucy, MD   1 year ago Contusion of lesser toe of left foot without damage to nail, initial encounter   [place], Dionne Bucy, MD   2 years ago Encounter for annual physical exam   [facility] [place], Dionne Bucy, MD       Future Appointments             In 1 month Bacigalupo, Dionne Bucy, MD [facility] [place]

## 2021-05-06 NOTE — Telephone Encounter (Signed)
Pt's medication was sent to pt's pharmacy as requested. Confirmation received.  °

## 2021-06-04 ENCOUNTER — Other Ambulatory Visit: Payer: Self-pay | Admitting: Family Medicine

## 2021-06-04 NOTE — Telephone Encounter (Signed)
Please review for Dr. B ° ° °Thanks,  ° °-Christena Sunderlin  °

## 2021-06-04 NOTE — Telephone Encounter (Signed)
   Notes to clinic:  Patient has upcoming appt on 06/11/2021 Review for refill   Requested Prescriptions  Pending Prescriptions Disp Refills   traZODone (DESYREL) 100 MG tablet [Pharmacy Med Name: traZODone 100 MG TABLET] 30 tablet 0    Sig: TAKE ONE TABLET BY MOUTH EVERY NIGHT AT BEDTIME AS NEEDED FOR SLEEP      Psychiatry: Antidepressants - Serotonin Modulator Failed - 06/04/2021  9:59 AM      Failed - Valid encounter within last 6 months    Recent Outpatient Visits           4 months ago Acute non-recurrent pansinusitis   [facility] [place], Dionne Bucy, MD   10 months ago Bipolar disorder, current episode mixed, moderate ([place])   [facility], Dionne Bucy, MD   1 year ago Encounter for annual physical exam   [facility] [place], Dionne Bucy, MD   1 year ago Contusion of lesser toe of left foot without damage to nail, initial encounter   [facility], Dionne Bucy, MD   2 years ago Encounter for annual physical exam   [facility] [place], Dionne Bucy, MD       Future Appointments             In 1 week Bacigalupo, Dionne Bucy, MD [facility]             Passed - Completed PHQ-2 or PHQ-9 in the last 360 days

## 2021-06-11 ENCOUNTER — Encounter: Payer: 59 | Admitting: Family Medicine

## 2021-07-27 ENCOUNTER — Other Ambulatory Visit: Payer: Self-pay | Admitting: Family Medicine

## 2021-07-27 DIAGNOSIS — F418 Other specified anxiety disorders: Secondary | ICD-10-CM

## 2021-07-27 NOTE — Telephone Encounter (Signed)
Requested Prescriptions  Pending Prescriptions Disp Refills  . clonazePAM (KLONOPIN) 0.5 MG tablet [Pharmacy Med Name: clonazePAM 0.5 MG TABLET] 30 tablet     Sig: TAKE ONE TABLET BY MOUTH TWICE A DAY AS NEEDED FOR ANXIETY     Not Delegated - Psychiatry:  Anxiolytics/Hypnotics Failed - 07/27/2021 10:33 AM      Failed - This refill cannot be delegated      Failed - Urine Drug Screen completed in last 360 days      Failed - Valid encounter within last 6 months    Recent Outpatient Visits          6 months ago Acute non-recurrent pansinusitis   [facility] [place], Dionne Bucy, MD   1 year ago Bipolar disorder, current episode mixed, moderate ([place])   [facility], Dionne Bucy, MD   1 year ago Encounter for annual physical exam   [facility] [place], Dionne Bucy, MD   1 year ago Contusion of lesser toe of left foot without damage to nail, initial encounter   [facility], Dionne Bucy, MD   2 years ago Encounter for annual physical exam   [facility], Dionne Bucy, MD      Future Appointments            In 3 months Bacigalupo, Dionne Bucy, MD [place]           . traZODone (DESYREL) 100 MG tablet [Pharmacy Med Name: traZODone 100 MG TABLET] 30 tablet 0    Sig: TAKE ONE TABLET BY MOUTH EVERY NIGHT AT BEDTIME AS NEEDED FOR SLEEP     Psychiatry: Antidepressants - Serotonin Modulator Failed - 07/27/2021 10:33 AM      Failed - Completed PHQ-2 or PHQ-9 in the last 360 days      Failed - Valid encounter within last 6 months    Recent Outpatient Visits          6 months ago Acute non-recurrent pansinusitis   [facility] [place], Dionne Bucy, MD   1 year ago Bipolar disorder, current episode mixed, moderate ([place])   [facility], Dionne Bucy, MD   1 year ago Encounter for annual physical exam   [facility] [place],  Dionne Bucy, MD   1 year ago Contusion of lesser toe of left foot without damage to nail, initial encounter   [facility], Dionne Bucy, MD   2 years ago Encounter for annual physical exam   [facility] [place], Dionne Bucy, MD      Future Appointments            In 3 months Bacigalupo, Dionne Bucy, MD [facility]           . lamoTRIgine (LAMICTAL) 100 MG tablet [Pharmacy Med Name: lamoTRIgine 100 MG TABLET] 60 tablet     Sig: TAKE ONE TABLET BY MOUTH TWICE A DAY     Not Delegated - Neurology:  Anticonvulsants Failed - 07/27/2021 10:33 AM      Failed - This refill cannot be delegated      Failed - Valid encounter within last 12 months    Recent Outpatient Visits          6 months ago Acute non-recurrent pansinusitis   [facility] [place], Dionne Bucy, MD   1 year ago Bipolar disorder, current episode mixed, moderate [facility])   [facility] [place], [place],  MD   1 year ago Encounter for annual physical exam   [facility] [place], Dionne Bucy, MD   1 year ago Contusion of lesser toe of left foot without damage to nail, initial encounter   [facility] [place], Dionne Bucy, MD   2 years ago Encounter for annual physical exam   [facility], Dionne Bucy, MD      Future Appointments            In 3 months Bacigalupo, Dionne Bucy, MD [facility]           Passed - HCT in normal range and within 360 days    Hematocrit  Date Value Ref Range Status  11/08/2020 40.2 34.0 - 46.6 % Final         Passed - HGB in normal range and within 360 days    Hemoglobin  Date Value Ref Range Status  11/08/2020 14.1 11.1 - 15.9 g/dL Final         Passed - PLT in normal range and within 360 days    Platelets  Date Value Ref Range Status  11/08/2020 403 150 - 450 x10E3/uL Final         Passed - WBC in normal range and within 360 days     WBC  Date Value Ref Range Status  11/08/2020 6.1 3.4 - 10.8 x10E3/uL Final

## 2021-07-27 NOTE — Telephone Encounter (Signed)
Requested medication (s) are due for refill today: yes  Requested medication (s) are on the active medication list: yes  Last refill:  clonazepam:05/06/21            Lamictal: 2 doses active med list// 03/08/21 #60 (100 mg)  and 05/06/21 (150 mg)  Future visit scheduled: yes  Notes to clinic: RF not delegated to NT to RF   Requested Prescriptions  Pending Prescriptions Disp Refills   clonazePAM (KLONOPIN) 0.5 MG tablet [Pharmacy Med Name: clonazePAM 0.5 MG TABLET] 30 tablet     Sig: TAKE ONE TABLET BY MOUTH TWICE A DAY AS NEEDED FOR ANXIETY     Not Delegated - Psychiatry:  Anxiolytics/Hypnotics Failed - 07/27/2021 10:33 AM      Failed - This refill cannot be delegated      Failed - Urine Drug Screen completed in last 360 days      Failed - Valid encounter within last 6 months    Recent Outpatient Visits           6 months ago Acute non-recurrent pansinusitis   [facility] [place], Dionne Bucy, MD   1 year ago Bipolar disorder, current episode mixed, moderate ([place])   [facility], Dionne Bucy, MD   1 year ago Encounter for annual physical exam   [facility] [place], Dionne Bucy, MD   1 year ago Contusion of lesser toe of left foot without damage to nail, initial encounter   [facility], [facility] Bucy, MD   2 years ago Encounter for annual physical exam   [facility] [place], Dionne Bucy, MD       Future Appointments             In 3 months Bacigalupo, Dionne Bucy, MD [facility]             lamoTRIgine (LAMICTAL) 100 MG tablet [Pharmacy Med Name: lamoTRIgine 100 MG TABLET] 60 tablet     Sig: TAKE ONE TABLET BY MOUTH TWICE A DAY     Not Delegated - Neurology:  Anticonvulsants Failed - 07/27/2021 10:33 AM      Failed - This refill cannot be delegated      Failed - Valid encounter within last 12 months    Recent Outpatient Visits           6 months ago Acute  non-recurrent pansinusitis   [facility] [place], Dionne Bucy, MD   1 year ago Bipolar disorder, current episode mixed, moderate ([place])   [facility], Dionne Bucy, MD   1 year ago Encounter for annual physical exam   [facility] [place], Dionne Bucy, MD   1 year ago Contusion of lesser toe of left foot without damage to nail, initial encounter   [facility], Dionne Bucy, MD   2 years ago Encounter for annual physical exam   [facility] [place], Dionne Bucy, MD       Future Appointments             In 3 months Bacig[place], Dionne Bucy, MD [facility]            Passed - HCT in normal range and within 360 days    Hematocrit  Date Value Ref Range Status  11/08/2020 40.2 34.0 - 46.6 % Final          Passed - HGB in normal range and within 360 days  Hemoglobin  Date Value Ref Range Status  11/08/2020 14.1 11.1 - 15.9 g/dL Final          Passed - PLT in normal range and within 360 days    Platelets  Date Value Ref Range Status  11/08/2020 403 150 - 450 x10E3/uL Final          Passed - WBC in normal range and within 360 days    WBC  Date Value Ref Range Status  11/08/2020 6.1 3.4 - 10.8 x10E3/uL Final          Signed Prescriptions Disp Refills   traZODone (DESYREL) 100 MG tablet 30 tablet 0    Sig: TAKE ONE TABLET BY MOUTH EVERY NIGHT AT BEDTIME AS NEEDED FOR SLEEP     Psychiatry: Antidepressants - Serotonin Modulator Failed - 07/27/2021 10:33 AM      Failed - Completed PHQ-2 or PHQ-9 in the last 360 days      Failed - Valid encounter within last 6 months    Recent Outpatient Visits           6 months ago Acute non-recurrent pansinusitis   [facility] [place], Dionne Bucy, MD   1 year ago Bipolar disorder, current episode mixed, moderate ([place])   [facility] [place], Dionne Bucy, MD   1 year ago Encounter for annual  physical exam   [facility] [place], Dionne Bucy, MD   1 year ago Contusion of lesser toe of left foot without damage to nail, initial encounter   [facility] [place], Dionne Bucy, MD   2 years ago Encounter for annual physical exam   [facility] [place], Dionne Bucy, MD       Future Appointments             In 3 months Bacigalupo, Dionne Bucy, MD [facility]

## 2021-07-30 NOTE — Telephone Encounter (Signed)
She is supposed to be followed by Psych who manages these meds. Need to find out if she is seeing psych.

## 2021-07-31 NOTE — Telephone Encounter (Signed)
Pt called back to request a call back to discuss this with the clinic. She will be in a conference starting at 1 pm

## 2021-07-31 NOTE — Telephone Encounter (Signed)
Pt called back about status of lamoTRIgine (LAMICTAL) 150 MG tablet  medication. Please call back

## 2021-07-31 NOTE — Telephone Encounter (Signed)
Pt does not have psych and said dr b prescribes her medications and would like a callback this morning . Pt does not have insurance and has an appt with dr b in dec 2022

## 2021-08-21 ENCOUNTER — Other Ambulatory Visit: Payer: Self-pay | Admitting: Family Medicine

## 2021-08-21 DIAGNOSIS — I499 Cardiac arrhythmia, unspecified: Secondary | ICD-10-CM

## 2021-08-21 NOTE — Telephone Encounter (Signed)
Pt has appt 11/07/21

## 2021-09-21 ENCOUNTER — Other Ambulatory Visit: Payer: Self-pay | Admitting: Family Medicine

## 2021-09-22 NOTE — Telephone Encounter (Signed)
Requested Prescriptions  Pending Prescriptions Disp Refills  . traZODone (DESYREL) 100 MG tablet [Pharmacy Med Name: traZODone 100 MG TABLET] 47 tablet 0    Sig: TAKE ONE TABLET BY MOUTH EVERY NIGHT AT BEDTIME AS NEEDED FOR SLEEP     Psychiatry: Antidepressants - Serotonin Modulator Failed - 09/21/2021 11:40 AM      Failed - Completed PHQ-2 or PHQ-9 in the last 360 days      Failed - Valid encounter within last 6 months    Recent Outpatient Visits          8 months ago Acute non-recurrent pansinusitis   [facility] [place], Dionne Bucy, MD   1 year ago Bipolar disorder, current episode mixed, moderate ([place])   [place], Dionne Bucy, MD   1 year ago Encounter for annual physical exam   [facility] [place], Dionne Bucy, MD   2 years ago Contusion of lesser toe of left foot without damage to nail, initial encounter   [facility], Dionne Bucy, MD   2 years ago Encounter for annual physical exam   [facility] [place], Dionne Bucy, MD      Future Appointments            In 1 month Bacigalupo, Dionne Bucy, MD [facility] [place]

## 2021-10-02 ENCOUNTER — Telehealth: Payer: Self-pay

## 2021-10-02 NOTE — Telephone Encounter (Signed)
Copied from [place] [id number]. 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  She does not want to talk to just anybody. She has a very good relationship wth Dr B and has talked with her for years.  There are no available appts. She wanted to talk with her if she had anytime even a zoom, virtual. CB is [phone]. She completley understands if not an option as knows her situation but does not know what to do. We had a discussion about other providers and she seems to be ok with Dr Rosana Berger as an option if Dr B is not. FU to advise.

## 2021-10-03 ENCOUNTER — Ambulatory Visit: Payer: Self-pay | Admitting: *Deleted
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
## 2021-10-03 NOTE — Telephone Encounter (Signed)
Pt called in c/o being anxious and having depression.    See triage notes.   She is wanting to see Dr. Brita Romp.    While talking with her she got another call and needed to take it so she is going to call back to get an appt.       She has Bipolar Type II and is having a conflict with a co worker that has triggered the anxiety.  I'm sending my notes to [facility] high priority for an appt.   Pt is going to call back in.   She called prior to the office being open.

## 2021-10-03 NOTE — Telephone Encounter (Signed)
Pt has apt for 10/07/2021 at 1pm  Thanks,   -Mickel Baas

## 2021-10-03 NOTE — Telephone Encounter (Signed)
Can double book my 1pm on Monday 11/7 with a virtual visit for her.

## 2021-10-03 NOTE — Telephone Encounter (Signed)
Apt scheduled.  Pt advised.   Thanks,   -Mickel Baas

## 2021-10-07 ENCOUNTER — Telehealth (INDEPENDENT_AMBULATORY_CARE_PROVIDER_SITE_OTHER): Payer: BC Managed Care – PPO | Admitting: Family Medicine

## 2021-10-07 ENCOUNTER — Encounter: Payer: Self-pay | Admitting: Family Medicine

## 2021-10-07 DIAGNOSIS — F32A Depression, unspecified: Secondary | ICD-10-CM | POA: Diagnosis not present

## 2021-10-07 DIAGNOSIS — F419 Anxiety disorder, unspecified: Secondary | ICD-10-CM

## 2021-10-07 DIAGNOSIS — F3162 Bipolar disorder, current episode mixed, moderate: Secondary | ICD-10-CM | POA: Diagnosis not present

## 2021-10-07 MED ORDER — LAMOTRIGINE 150 MG PO TABS: 150.0000 mg | ORAL_TABLET | Freq: Two times a day (BID) | ORAL | 1 refills | Status: DC

## 2021-10-07 MED ORDER — TRAZODONE HCL 100 MG PO TABS: ORAL_TABLET | ORAL | 1 refills | Status: DC
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
## 2021-10-07 NOTE — Assessment & Plan Note (Signed)
Chronic and uncontrolled Part of her mood disorder A lot of stress contributing at this time Avoid SSRIs due to risk of mania and h/o serotonin syndrome Will increase Lamictal as below

## 2021-10-07 NOTE — Assessment & Plan Note (Signed)
Chronic and uncontrolled Current exacerbation with both hypomania and depression symptoms Increase Lamictal to 150mg  BID (from 150mg  in the mornings and 100mg  in the evenings) Encourage therapy - upcoming appt Continue trazodone Ok to use clonazepam/hydroxyzine/propranolol prn - uses sparingly Contracted for safety - no SI/HI F/u in 1 month

## 2021-10-17 ENCOUNTER — Ambulatory Visit: Payer: Self-pay | Admitting: *Deleted

## 2021-10-17 NOTE — Telephone Encounter (Signed)
2nd attempt to reach pt. Left VM to call back to discuss symptoms.  

## 2021-10-17 NOTE — Telephone Encounter (Signed)
3rd attempt, Patient called, left VM to return the call to the office to discuss symptoms with a nurse.    Summary: advice   Pt thinks she may have COVID again, pt started having a fever and symptoms on Tuesday, pt stated her current symptoms are not as bad as first time she had covid, please advise

## 2021-10-17 NOTE — Telephone Encounter (Signed)
I attempted to return pt's call.   Her voice mailbox is full and not accepting messages at this time.  She had called in earlier concerned about possibly having COVID again.  Tuesday she started with fever and symptoms though her symptoms are not as bad this time as before.

## 2021-10-18 NOTE — Telephone Encounter (Signed)
Noted  

## 2021-10-21 NOTE — Telephone Encounter (Signed)
Patient returned call from 10/17/21 saying she just realized there was a missed call with a message. She says when she called she was having fever, body aches, cough, sore throat, COVID like symptoms. She says now no fever over the weekend, she still has a cough with nothing coming up, congested cough, and a sore throat, runny/congested nose. She says she's still feeling worn out like she has the flu. She asked about a COVID test wanting to know if a home test would be just as good. I advised she could do a home COVID test. She says she will need documentation that she had this conversation to provide to her employer when she returns. She says she's been sick since last Tuesday when the symptoms started. I advised she will need a virtual visit. Spoke to [place], [facility] who says no availability in the office this week and the patient will need to do a virtual visit on the [employer] site. Patient advised, she says she doesn't know how to do that. She says she will go to the UC if she is not able to figure out the virtual. Care advice given, patient verbalized understanding.

## 2021-10-21 NOTE — Telephone Encounter (Signed)
Reason for Disposition . [1] Sinus congestion (pressure, fullness) AND [2] present > 10 days  Answer Assessment - Initial Assessment Questions 1. ONSET: "When did the nasal discharge start?"      Last Monday 2. AMOUNT: "How much discharge is there?"      N/A 3. COUGH: "Do you have a cough?" If yes, ask: "Describe the color of your sputum" (clear, white, yellow, green)     Yes at night; nothing coming up, congested 4. RESPIRATORY DISTRESS: "Describe your breathing."      SOB this morning, not now 5. FEVER: "Do you have a fever?" If Yes, ask: "What is your temperature, how was it measured, and when did it start?"     No 6. SEVERITY: "Overall, how bad are you feeling right now?" (e.g., doesn't interfere with normal activities, staying home from school/work, staying in bed)      I don't feel well 7. OTHER SYMPTOMS: "Do you have any other symptoms?" (e.g., sore throat, earache, wheezing, vomiting)     Sore throat 8. PREGNANCY: "Is there any chance you are pregnant?" "When was your last menstrual period?"     N/A  Protocols used: Common Cold-A-AH

## 2021-10-23 DIAGNOSIS — R059 Cough, unspecified: Secondary | ICD-10-CM | POA: Diagnosis not present

## 2021-10-23 DIAGNOSIS — Z20822 Contact with and (suspected) exposure to covid-19: Secondary | ICD-10-CM | POA: Diagnosis not present

## 2021-10-23 DIAGNOSIS — Z23 Encounter for immunization: Secondary | ICD-10-CM | POA: Diagnosis not present

## 2021-10-23 DIAGNOSIS — J069 Acute upper respiratory infection, unspecified: Secondary | ICD-10-CM | POA: Diagnosis not present

## 2021-10-25 NOTE — Progress Notes (Signed)
Vaccine only

## 2021-11-05 NOTE — Progress Notes (Signed)
Complete physical exam   Patient: Kathleen Perkins   DOB: [birth year]   60 y.o. Female  MRN: [id number] Visit Date: 11/07/2021  Today's healthcare provider: Lavon Paganini, MD   Chief Complaint  Patient presents with   Annual Exam   I,Sulibeya S Dimas,acting as a scribe for Lavon Paganini, MD.,have documented all relevant documentation on the behalf of Lavon Paganini, MD,as directed by  Lavon Paganini, MD while in the presence of Lavon Paganini, MD.  Subjective    Kathleen Perkins is a 60 y.o. female who presents today for a complete physical exam.  She reports consuming a general diet. The patient does not participate in regular exercise at present. She generally feels well. She reports sleeping well. She does not have additional problems to discuss today.  HPI   She got engaged last Christmas.  She is doing overall well.  She is looking for a new job in a less toxic environment and hoping to get insurance around the beginning of the year for this.  Past Medical History:  Diagnosis Date   Anxiety    Melanoma of upper arm ([place])    MVP (mitral valve prolapse)    Primary osteoarthritis of left knee 08/15/2014   Past Surgical History:  Procedure Laterality Date   ARTHROSCOPIC REPAIR ACL     BREAST BIOPSY Left 06/23/2013   u/s bx- neg   BREAST EXCISIONAL BIOPSY Left 07/14/2013   us/bx-neg   MELANOMA EXCISION     left arm.    REPLACEMENT TOTAL KNEE     left    Social History   Socioeconomic History   Marital status: Divorced    Spouse name: Not on file   Number of children: Not on file   Years of education: Not on file   Highest education level: Not on file  Occupational History   Not on file  Tobacco Use   Smoking status: Never   Smokeless tobacco: Never  Substance and Sexual Activity   Alcohol use: No   Drug use: No   Sexual activity: Not on file  Other Topics Concern   Not on file  Social History Narrative   Not on file   Social  Determinants of Health   Financial Resource Strain: Not on file  Food Insecurity: Not on file  Transportation Needs: Not on file  Physical Activity: Not on file  Stress: Not on file  Social Connections: Not on file  Intimate Partner Violence: Not on file   Family Status  Relation Name Status   Mother  Deceased       lung cancer    Father  Deceased   Sister  Deceased       cancer    Brother  Alive   Neg Hx  (Not Specified)   Family History  Problem Relation Age of Onset   Heart disease Father        CABG x 4    Hypertension Father    Hyperlipidemia Father    Heart disease Brother        CABG x 4    Hyperlipidemia Brother    Hypertension Brother    Heart attack Brother    Breast cancer Neg Hx    Allergies  Allergen Reactions   Codeine Nausea Only   Erythromycin Nausea Only   Nsaids    Ibuprofen Nausea Only    Patient Care Team: Virginia Crews, MD as PCP - General (Family Medicine)   Medications:  Outpatient Medications Prior to Visit  Medication Sig   atenolol (TENORMIN) 25 MG tablet TAKE ONE TABLET BY MOUTH TWICE A DAY AS NEEDED   clonazePAM (KLONOPIN) 0.5 MG tablet TAKE ONE TABLET BY MOUTH TWICE A DAY AS NEEDED FOR ANXIETY   hydrochlorothiazide (HYDRODIURIL) 12.5 MG tablet Take 1 tablet (12.5 mg total) by mouth daily.   hydrOXYzine (ATARAX/VISTARIL) 10 MG tablet TAKE ONE TABLET BY MOUTH THREE TIMES A DAY AS NEEDED   lamoTRIgine (LAMICTAL) 150 MG tablet Take 1 tablet (150 mg total) by mouth 2 (two) times daily.   ondansetron (ZOFRAN) 4 MG tablet Take 1 tablet (4 mg total) by mouth every 8 (eight) hours as needed for nausea or vomiting.   propranolol (INDERAL) 10 MG tablet TAKE ONE TABLET BY MOUTH FOUR TIMES A DAY AS NEEDED   traZODone (DESYREL) 100 MG tablet TAKE ONE TABLET BY MOUTH EVERY NIGHT AT BEDTIME AS NEEDED FOR SLEEP   No facility-administered medications prior to visit.    Review of Systems  HENT:  Positive for congestion.   All other systems  reviewed and are negative.  Last CBC Lab Results  Component Value Date   WBC 6.1 11/08/2020   HGB 14.1 11/08/2020   HCT 40.2 11/08/2020   MCV 89 11/08/2020   MCH 31.3 11/08/2020   RDW 11.7 11/08/2020   PLT 403 38/18/2993   Last metabolic panel Lab Results  Component Value Date   GLUCOSE 100 (H) 11/08/2020   NA 134 11/08/2020   K 4.3 11/08/2020   CL 95 (L) 11/08/2020   CO2 24 11/08/2020   BUN 10 11/08/2020   CREATININE 0.84 11/08/2020   GFRNONAA 76 11/08/2020   CALCIUM 10.1 11/08/2020   PROT 7.7 11/08/2020   ALBUMIN 5.0 (H) 11/08/2020   LABGLOB 2.7 11/08/2020   AGRATIO 1.9 11/08/2020   BILITOT 0.4 11/08/2020   ALKPHOS 93 11/08/2020   AST 30 11/08/2020   ALT 32 11/08/2020   Last lipids Lab Results  Component Value Date   CHOL 268 (H) 11/08/2020   HDL 93 11/08/2020   LDLCALC 145 (H) 11/08/2020   TRIG 175 (H) 11/08/2020   CHOLHDL 2.9 11/08/2020     Objective    BP 129/79 (BP Location: Right Arm, Patient Position: Sitting, Cuff Size: Large)   Pulse 61   Temp (!) 97.3 F (36.3 C) (Temporal)   Resp 16   Ht 5' 4"  (1.626 m)   Wt 189 lb 6.4 oz (85.9 kg)   SpO2 99%   BMI 32.51 kg/m  BP Readings from Last 3 Encounters:  11/07/21 129/79  12/07/20 122/72  02/17/20 (!) 151/88   Wt Readings from Last 3 Encounters:  11/07/21 189 lb 6.4 oz (85.9 kg)  12/07/20 188 lb 9.6 oz (85.5 kg)  02/17/20 175 lb (79.4 kg)      Physical Exam Vitals reviewed.  Constitutional:      General: She is not in acute distress.    Appearance: Normal appearance. She is well-developed. She is not diaphoretic.  HENT:     Head: Normocephalic and atraumatic.     Right Ear: Tympanic membrane, ear canal and external ear normal.     Left Ear: Tympanic membrane, ear canal and external ear normal.  Eyes:     General: No scleral icterus.    Conjunctiva/sclera: Conjunctivae normal.     Pupils: Pupils are equal, round, and reactive to light.  Neck:     Thyroid: No thyromegaly.   Cardiovascular:     Rate  and Rhythm: Normal rate and regular rhythm.     Pulses: Normal pulses.     Heart sounds: Normal heart sounds. No murmur heard. Pulmonary:     Effort: Pulmonary effort is normal. No respiratory distress.     Breath sounds: Normal breath sounds. No wheezing or rales.  Abdominal:     General: There is no distension.     Palpations: Abdomen is soft.     Tenderness: There is no abdominal tenderness.  Musculoskeletal:        General: No deformity.     Cervical back: Neck supple.     Right lower leg: No edema.     Left lower leg: No edema.  Lymphadenopathy:     Cervical: No cervical adenopathy.  Skin:    General: Skin is warm and dry.     Findings: No rash.  Neurological:     Mental Status: She is alert and oriented to person, place, and time. Mental status is at baseline.     Sensory: No sensory deficit.     Motor: No weakness.     Gait: Gait normal.  Psychiatric:        Mood and Affect: Mood normal.        Behavior: Behavior normal.        Thought Content: Thought content normal.      Last depression screening scores PHQ 2/9 Scores 11/07/2021 10/07/2021 07/16/2020  PHQ - 2 Score 0 5 5  PHQ- 9 Score 0 14 19   Last fall risk screening Fall Risk  11/07/2021  Falls in the past year? 0  Number falls in past yr: 0  Injury with Fall? 0  Risk for fall due to : No Fall Risks  Follow up Falls evaluation completed   Last Audit-C alcohol use screening Alcohol Use Disorder Test (AUDIT) 11/07/2021  1. How often do you have a drink containing alcohol? 0  2. How many drinks containing alcohol do you have on a typical day when you are drinking? 0  3. How often do you have six or more drinks on one occasion? 0  AUDIT-C Score 0   A score of 3 or more in women, and 4 or more in men indicates increased risk for alcohol abuse, EXCEPT if all of the points are from question 1   No results found for any visits on 11/07/21.  Assessment & Plan    Routine Health  Maintenance and Physical Exam  Exercise Activities and Dietary recommendations  Goals   None     Immunization History  Administered Date(s) Administered   DTaP 05/06/1966   Diphtheria 04/26/1969, 04/05/1970   Influenza,inj,Quad PF,6+ Mos 12/06/2020   Influenza-Unspecified 10/23/2021   MMR 05/13/2013, 07/01/2013   Measles 03/12/1971   Mumps 07/22/1972   OPV 01/30/1963, 03/27/1963, 04/26/1969, 04/30/1971, 06/06/1975   PFIZER(Purple Top)SARS-COV-2 Vaccination 11/01/2020, 11/22/2020   PPD Test 05/08/2020   Rubella 03/12/1971   Tdap 05/05/2013, 02/17/2020   Tetanus 04/26/1969, 04/05/1970    Health Maintenance  Topic Date Due   Pneumococcal Vaccine 20-40 Years old (1 - PCV) Never done   Hepatitis C Screening  Never done   Zoster Vaccines- Shingrix (1 of 2) Never done   COLONOSCOPY (Pts 45-14yr Insurance coverage will need to be confirmed)  Never done   MAMMOGRAM  11/26/2019   PAP SMEAR-Modifier  10/28/2020   COVID-19 Vaccine (3 - Pfizer risk series) 12/20/2020   TETANUS/TDAP  02/16/2030   INFLUENZA VACCINE  Completed   HIV Screening  Completed   HPV VACCINES  Aged Out    Discussed health benefits of physical activity, and encouraged her to engage in regular exercise appropriate for her age and condition.  Problem List Items Addressed This Visit       Other   Bipolar affective ([place])    Chronic and stable Continue current meds Encourage therapy Not followed by psychiatry      Hyperlipidemia    Reviewed last lipid panel Not currently on a statin Recheck FLP and CMP Discussed diet and exercise       Relevant Orders   Comprehensive metabolic panel   Lipid Panel With LDL/HDL Ratio   B12 deficiency   Relevant Orders   Vitamin B12   Avitaminosis D   Relevant Orders   VITAMIN D 25 Hydroxy (Vit-D Deficiency, Fractures)   Class 1 obesity without serious comorbidity with body mass index (BMI) of 32.0 to 32.9 in adult    Discussed importance of healthy weight  management Discussed diet and exercise       Other Visit Diagnoses     Encounter for annual physical exam    -  Primary   Relevant Orders   Comprehensive metabolic panel   Lipid Panel With LDL/HDL Ratio   Need for hepatitis C screening test       Relevant Orders   Hepatitis C antibody   Encounter for screening mammogram for malignant neoplasm of breast       Relevant Orders   MM 3D SCREEN BREAST BILATERAL   Hyperglycemia       Relevant Orders   Hemoglobin A1c   Long-term use of high-risk medication       Relevant Orders   Comprehensive metabolic panel   Lipid Panel With LDL/HDL Ratio   Vitamin B12   VITAMIN D 25 Hydroxy (Vit-D Deficiency, Fractures)   Hemoglobin A1c   CBC        Return in about 6 months (around 05/08/2022) for chronic disease f/u with pap.     I, Lavon Paganini, MD, have reviewed all documentation for this visit. The documentation on 11/07/21 for the exam, diagnosis, procedures, and orders are all accurate and complete.   Eian Vandervelden, Dionne Bucy, MD, MPH [facility]

## 2021-11-07 ENCOUNTER — Ambulatory Visit (INDEPENDENT_AMBULATORY_CARE_PROVIDER_SITE_OTHER): Payer: Self-pay | Admitting: Family Medicine

## 2021-11-07 ENCOUNTER — Other Ambulatory Visit: Payer: Self-pay

## 2021-11-07 ENCOUNTER — Encounter: Payer: Self-pay | Admitting: Family Medicine

## 2021-11-07 VITALS — BP 129/79 | HR 61 | Temp 97.3°F | Resp 16 | Ht 64.0 in | Wt 189.4 lb

## 2021-11-07 DIAGNOSIS — Z1231 Encounter for screening mammogram for malignant neoplasm of breast: Secondary | ICD-10-CM

## 2021-11-07 DIAGNOSIS — E538 Deficiency of other specified B group vitamins: Secondary | ICD-10-CM

## 2021-11-07 DIAGNOSIS — Z79899 Other long term (current) drug therapy: Secondary | ICD-10-CM

## 2021-11-07 DIAGNOSIS — Z0000 Encounter for general adult medical examination without abnormal findings: Secondary | ICD-10-CM

## 2021-11-07 DIAGNOSIS — F3132 Bipolar disorder, current episode depressed, moderate: Secondary | ICD-10-CM

## 2021-11-07 DIAGNOSIS — R739 Hyperglycemia, unspecified: Secondary | ICD-10-CM

## 2021-11-07 DIAGNOSIS — Z1159 Encounter for screening for other viral diseases: Secondary | ICD-10-CM

## 2021-11-07 DIAGNOSIS — E782 Mixed hyperlipidemia: Secondary | ICD-10-CM

## 2021-11-07 DIAGNOSIS — Z1211 Encounter for screening for malignant neoplasm of colon: Secondary | ICD-10-CM

## 2021-11-07 DIAGNOSIS — E669 Obesity, unspecified: Secondary | ICD-10-CM | POA: Insufficient documentation

## 2021-11-07 DIAGNOSIS — Z6832 Body mass index (BMI) 32.0-32.9, adult: Secondary | ICD-10-CM

## 2021-11-07 DIAGNOSIS — E559 Vitamin D deficiency, unspecified: Secondary | ICD-10-CM

## 2021-11-07 NOTE — Assessment & Plan Note (Signed)
Reviewed last lipid panel Not currently on a statin Recheck FLP and CMP Discussed diet and exercise  

## 2021-11-07 NOTE — Patient Instructions (Signed)
Call us back when you get insurance to order the cologuard for colon cancer screening

## 2021-11-07 NOTE — Assessment & Plan Note (Signed)
Discussed importance of healthy weight management Discussed diet and exercise  

## 2021-11-07 NOTE — Assessment & Plan Note (Addendum)
Chronic and stable Continue current meds Encourage therapy Not followed by psychiatry

## 2021-12-11 ENCOUNTER — Telehealth: Payer: Self-pay

## 2021-12-11 NOTE — Telephone Encounter (Signed)
Copied from [facility] [id number]. Topic: General - Other >> Dec 11, 2021  1:59 PM Yvette Rack wrote: Reason for CRM: Pt would like to ask Dr. B what she thinks about Wegovy/Semaglutide for weight loss. Pt requests return call. Cb# [phone]

## 2021-12-12 NOTE — Telephone Encounter (Signed)
Can be helpful, but there are side effects to consider. Insurance coverage can be poor in people without diabetes. If she wants to consider would need appt to discuss options (virtual ok)

## 2021-12-12 NOTE — Telephone Encounter (Signed)
NA/voicemail full. If patient calls back ok for [facility] nurse to give patient providers message.

## 2021-12-12 NOTE — Telephone Encounter (Signed)
Please review

## 2021-12-12 NOTE — Telephone Encounter (Signed)
Patient called, no answer, mailbox is full.  

## 2021-12-13 NOTE — Telephone Encounter (Signed)
Attempted to call patient with provider response- no answer and mailbox is full.

## 2021-12-18 ENCOUNTER — Telehealth: Payer: Self-pay

## 2021-12-18 NOTE — Telephone Encounter (Signed)
Pt called, was given recommendation of Dr. Jacinto Reap. She states she wants to think about it a little more and do some research before she makes an appt. Will call back and schedule an appt when ready.

## 2021-12-23 ENCOUNTER — Other Ambulatory Visit: Payer: Self-pay | Admitting: Family Medicine

## 2021-12-23 DIAGNOSIS — I499 Cardiac arrhythmia, unspecified: Secondary | ICD-10-CM

## 2022-02-04 ENCOUNTER — Ambulatory Visit: Payer: Self-pay | Admitting: *Deleted

## 2022-02-04 NOTE — Telephone Encounter (Signed)
?  Chief Complaint: Dog bites ?Symptoms: Bruising ?Frequency: 6 days ago ?Pertinent Negatives: Patient denies  ?Disposition: '[]'$ ED /'[]'$ Urgent Care (no appt availability in office) / '[]'$ Appointment(In office/virtual)/ '[]'$  [facility]/ '[]'$ Home Care/ '[]'$ Refused Recommended Disposition /'[]'$ [facility]/ '[x]'$  Follow-up with PCP ?Additional Notes: Pt evasive historian.  ?Initially stated no broken skin, then reports blood from leg bite "But no saliva got through my jeans." States only bruising now. Speaking of needing workers comp claim. ADvised ED if skin was broken,states was not. Made appt for tomorrow. Please advise if ED recommended as initially advised. Pt evasive with details. BIte occurred 6 days ago. States did report to animal control. ? ? ? ? ?Reason for Disposition ? [1] Any break in skin from BITE (e.g., cut, puncture or scratch) AND[2] PET animal (e.g., dog, cat, or ferret) at risk for RABIES (e.g., sick, stray, unprovoked bite, developing country) ? Bite that didn't break the skin ? ?Answer Assessment - Initial Assessment Questions ?1. ANIMAL: "What type of animal caused the bite?" "Is the injury from a bite or a claw?" If the animal is a dog or a cat, ask: "Was it a pet or a stray?" "Was it acting ill or behaving strangely?" ?    Dog. Clients pet, no shots ?2. LOCATION: "Where is the bite located?"  ?    Left calf and left ?3. SIZE: "How big is the bite?" "What does it look like?"  ?    Bruised 4 in around on leg, skin broken, Skin on hip bite broken ?4. ONSET: "When did the bite happen?" (Minutes or hours ago)  ?    Last Wednesday ?5. CIRCUMSTANCES: "Tell me how this happened."  ?    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 TETANUS: "When was the last tetanus booster?" ?    unsure ? ?Protocols used: Animal Bite-A-AH ? ?

## 2022-02-05 ENCOUNTER — Ambulatory Visit: Payer: Self-pay | Admitting: Physician Assistant

## 2022-02-06 ENCOUNTER — Ambulatory Visit: Payer: Self-pay | Admitting: Physician Assistant

## 2022-02-06 NOTE — Progress Notes (Deleted)
?  ? ? ?  Established patient visit ? ? ?Patient: Kathleen Perkins   DOB: 1961-08-08   61 y.o. Female  MRN: [id number] ?Visit Date: 02/06/2022 ? ?Today's healthcare provider: Dani Gobble Mecum, PA-C  ? ?Perkins chief complaint on file. ? ?Subjective  ?  ? ? ?Medications: ?Outpatient Medications Prior to Visit  ?Medication Sig  ? atenolol (TENORMIN) 25 MG tablet TAKE ONE TABLET BY MOUTH TWICE A DAY AS NEEDED  ? clonazePAM (KLONOPIN) 0.5 MG tablet TAKE ONE TABLET BY MOUTH TWICE A DAY AS NEEDED FOR ANXIETY  ? hydrochlorothiazide (HYDRODIURIL) 12.5 MG tablet Take 1 tablet (12.5 mg total) by mouth daily.  ? hydrOXYzine (ATARAX/VISTARIL) 10 MG tablet TAKE ONE TABLET BY MOUTH THREE TIMES A DAY AS NEEDED  ? lamoTRIgine (LAMICTAL) 150 MG tablet Take 1 tablet (150 mg total) by mouth 2 (two) times daily.  ? ondansetron (ZOFRAN) 4 MG tablet Take 1 tablet (4 mg total) by mouth every 8 (eight) hours as needed for nausea or vomiting.  ? propranolol (INDERAL) 10 MG tablet TAKE ONE TABLET BY MOUTH FOUR TIMES A DAY AS NEEDED  ? traZODone (DESYREL) 100 MG tablet TAKE ONE TABLET BY MOUTH EVERY NIGHT AT BEDTIME AS NEEDED FOR SLEEP  ? ?Perkins facility-administered medications prior to visit.  ? ? ?Review of Systems ? ?{Labs  Heme  Chem  Endocrine  Serology  Results Review (optional):23779} ?  Objective  ?  ?There were Perkins vitals taken for this visit. ?{Show previous vital signs (optional):23777} ? ?Physical Exam  ?*** ? ?Perkins results found for any visits on 02/06/22. ? Assessment & Plan  ?  ? ?*** ? ?Perkins follow-ups on file.  ?   ? ?{provider attestation***:1} ? ? ?Erin E Mecum, PA-C  ?[place] ?[phone] (phone) ?[phone] (fax) ? ?[facility]  ?

## 2022-02-17 ENCOUNTER — Telehealth: Payer: Self-pay

## 2022-02-17 NOTE — Telephone Encounter (Signed)
Patient requesting a letter stating that she has no limitations for work. Please advise. She will contact work and see if they have a standard form. ?

## 2022-02-17 NOTE — Telephone Encounter (Signed)
Copied from [place] [phone]. Topic: General - Other ?>> Feb 17, 2022 11:54 AM Leward Quan A wrote: ?Reason for CRM: Patient called in to inform Dr B that she need a copy of her Physical from January for a job that she will be starting soon would like a call when ready for pick up please call [place] at Ph#  [phone] ?

## 2022-02-17 NOTE — Telephone Encounter (Signed)
Letter printed.

## 2022-02-17 NOTE — Telephone Encounter (Signed)
Can you print a letter that has date of last physical and states she has no work limitations, please? I'll sign it and then she can pick it up. We can fill out a form if needed too.

## 2022-02-18 NOTE — Telephone Encounter (Signed)
Left detailed message for patient to pick up letter at front desk.  ?

## 2022-02-21 NOTE — Telephone Encounter (Signed)
Pt called back asking the cost Quantifer[facility] lab.  She called asking and was sent to [facility] billing and they were not able to help her. ? ?CB#  [phone] ?

## 2022-02-21 NOTE — Telephone Encounter (Signed)
Forms completed and signed and given back to patient ?

## 2022-02-21 NOTE — Telephone Encounter (Signed)
Pt states they are requiring a form from [place] to be signed by the dr as well.  Pt wants you to know she will be there about 11:45 today and hopes Dr B will have a sec to sign it.  ?

## 2022-02-24 ENCOUNTER — Telehealth: Payer: Self-pay

## 2022-02-24 NOTE — Telephone Encounter (Signed)
Copied from [place] [phone]. Topic: General - Other ?>> Feb 21, 2022 12:29 PM Yvette Rack wrote: ?Reason for CRM: Pt stated she does not have insurance and would like to know the cost for a 2 step TB skin test. Cb# [phone] ?

## 2022-02-24 NOTE — Telephone Encounter (Signed)
Contacted patient back and advised that our office no longer provides skin test and that we can order a quantaferon tb gold test which is blood draw. Directed patient to contact local health department. KW ?

## 2022-02-25 ENCOUNTER — Other Ambulatory Visit: Payer: Self-pay

## 2022-02-25 ENCOUNTER — Ambulatory Visit (LOCAL_COMMUNITY_HEALTH_CENTER): Payer: Self-pay

## 2022-02-25 DIAGNOSIS — Z111 Encounter for screening for respiratory tuberculosis: Secondary | ICD-10-CM

## 2022-02-27 ENCOUNTER — Other Ambulatory Visit: Payer: Self-pay

## 2022-02-28 ENCOUNTER — Ambulatory Visit (LOCAL_COMMUNITY_HEALTH_CENTER): Payer: Self-pay

## 2022-02-28 DIAGNOSIS — Z111 Encounter for screening for respiratory tuberculosis: Secondary | ICD-10-CM

## 2022-02-28 LAB — TB SKIN TEST
Induration: 0 mm
TB Skin Test: NEGATIVE

## 2022-03-17 ENCOUNTER — Ambulatory Visit (LOCAL_COMMUNITY_HEALTH_CENTER): Payer: Self-pay

## 2022-03-17 DIAGNOSIS — Z111 Encounter for screening for respiratory tuberculosis: Secondary | ICD-10-CM

## 2022-03-18 ENCOUNTER — Other Ambulatory Visit: Payer: Self-pay

## 2022-03-20 ENCOUNTER — Ambulatory Visit (LOCAL_COMMUNITY_HEALTH_CENTER): Payer: Self-pay

## 2022-03-20 DIAGNOSIS — Z111 Encounter for screening for respiratory tuberculosis: Secondary | ICD-10-CM

## 2022-03-20 LAB — TB SKIN TEST
Induration: 0 mm
TB Skin Test: NEGATIVE

## 2022-03-23 ENCOUNTER — Other Ambulatory Visit: Payer: Self-pay | Admitting: Family Medicine

## 2022-03-23 DIAGNOSIS — I499 Cardiac arrhythmia, unspecified: Secondary | ICD-10-CM

## 2022-03-25 ENCOUNTER — Other Ambulatory Visit: Payer: Self-pay

## 2022-03-27 ENCOUNTER — Other Ambulatory Visit: Payer: Self-pay | Admitting: Family Medicine

## 2022-03-27 DIAGNOSIS — F418 Other specified anxiety disorders: Secondary | ICD-10-CM

## 2022-04-26 ENCOUNTER — Other Ambulatory Visit: Payer: Self-pay | Admitting: Family Medicine

## 2022-05-08 ENCOUNTER — Ambulatory Visit: Payer: Self-pay | Admitting: Family Medicine

## 2022-05-29 ENCOUNTER — Ambulatory Visit: Payer: Self-pay | Admitting: Family Medicine

## 2022-06-13 ENCOUNTER — Other Ambulatory Visit: Payer: Self-pay | Admitting: Family Medicine

## 2022-06-13 DIAGNOSIS — F418 Other specified anxiety disorders: Secondary | ICD-10-CM

## 2022-06-13 MED ORDER — CLONAZEPAM 0.5 MG PO TABS: 0.2500 mg | ORAL_TABLET | Freq: Two times a day (BID) | ORAL | 0 refills | Status: DC | PRN

## 2022-06-13 NOTE — Telephone Encounter (Signed)
Last refill: 03/27/2022 # 30 with 0 refills  Last office visit: 11/07/2021 No future appt scheduled

## 2022-06-13 NOTE — Telephone Encounter (Signed)
[facility] faxed refill request for the following medications:  clonazePAM (KLONOPIN) 0.5 MG tablet   Please advise.

## 2022-06-20 ENCOUNTER — Telehealth: Payer: Self-pay | Admitting: Family Medicine

## 2022-06-20 DIAGNOSIS — I499 Cardiac arrhythmia, unspecified: Secondary | ICD-10-CM

## 2022-06-20 MED ORDER — ATENOLOL 25 MG PO TABS: 25.0000 mg | ORAL_TABLET | Freq: Two times a day (BID) | ORAL | 0 refills | Status: DC | PRN

## 2022-06-20 NOTE — Telephone Encounter (Signed)
[facility] faxed refill request for the following medications:  atenolol (TENORMIN) 25 MG tablet    Please advise.

## 2022-06-28 ENCOUNTER — Other Ambulatory Visit: Payer: Self-pay | Admitting: Cardiovascular Disease

## 2022-07-18 ENCOUNTER — Other Ambulatory Visit: Payer: Self-pay | Admitting: Family Medicine

## 2022-07-29 ENCOUNTER — Emergency Department
Admission: EM | Admit: 2022-07-29 | Discharge: 2022-07-29 | Disposition: A | Discharge status: Home / Self Care | Payer: BC Managed Care – PPO | Attending: Emergency Medicine | Admitting: Emergency Medicine

## 2022-07-29 ENCOUNTER — Other Ambulatory Visit: Payer: Self-pay

## 2022-07-29 ENCOUNTER — Emergency Department: Payer: BC Managed Care – PPO

## 2022-07-29 DIAGNOSIS — R0602 Shortness of breath: Secondary | ICD-10-CM | POA: Insufficient documentation

## 2022-07-29 DIAGNOSIS — R079 Chest pain, unspecified: Secondary | ICD-10-CM | POA: Diagnosis not present

## 2022-07-29 DIAGNOSIS — R0789 Other chest pain: Secondary | ICD-10-CM | POA: Insufficient documentation

## 2022-07-29 LAB — BASIC METABOLIC PANEL
Anion gap: 8 (ref 5–15)
BUN: 11 mg/dL (ref 6–20)
CO2: 25 mmol/L (ref 22–32)
Calcium: 9.2 mg/dL (ref 8.9–10.3)
Chloride: 102 mmol/L (ref 98–111)
Creatinine, Ser: 0.85 mg/dL (ref 0.44–1.00)
GFR, Estimated: >60 mL/min (ref >60)
Glucose, Bld: 118 mg/dL — ABNORMAL HIGH (ref 70–99)
Potassium: 3.6 mmol/L (ref 3.5–5.1)
Sodium: 135 mmol/L (ref 135–145)

## 2022-07-29 LAB — CBC
HCT: 45.6 % (ref 36.0–46.0)
Hemoglobin: 15 g/dL (ref 12.0–15.0)
MCH: 30.2 pg (ref 26.0–34.0)
MCHC: 32.9 g/dL (ref 30.0–36.0)
MCV: 91.9 fL (ref 80.0–100.0)
Platelets: 349 10*3/uL (ref 150–400)
RBC: 4.96 MIL/uL (ref 3.87–5.11)
RDW: 13.2 % (ref 11.5–15.5)
WBC: 6.8 10*3/uL (ref 4.0–10.5)
nRBC: 0 % (ref 0.0–0.2)

## 2022-07-29 LAB — TROPONIN I (HIGH SENSITIVITY): Troponin I (High Sensitivity): 6 ng/L (ref <18)

## 2022-07-29 NOTE — ED Triage Notes (Signed)
Pt comes with c/o CP that radiates to left sided and arm. Pt states pain is severe and never had anything like this before. Pt states SOb. Pt states she took 4-80 gm aspirin earlier.   Pt states some nausea

## 2022-07-29 NOTE — ED Notes (Signed)
See triage note  Presents with chest pain this am  Developed pain while getting ready for work   States pain has eased off   States she took some ASA and Klonopin PTA

## 2022-07-29 NOTE — ED Provider Notes (Signed)
[facility] Provider Note   Event Date/Time   First MD Initiated Contact with Patient 07/29/22 1503     (approximate) History  Chest Pain  HPI Kathleen Perkins is a 61 y.o. female with a stated past medical history of mitral valve prolapse who presents for left upper chest pain that radiated into her left arm starting approximately 11:00 today and resolving spontaneously over the next couple hours after taking 324 mg of aspirin.  Patient states that she has had chest pain similar to this in the past however they have never been this acute.  Patient describes this pain as a 9/10 crushing/pressure pain in the chest that did not worsen or improve with exertion.  Patient does endorse associated shortness of breath ROS: Patient currently denies any vision changes, tinnitus, difficulty speaking, facial droop, sore throat, abdominal pain, nausea/vomiting/diarrhea, dysuria, or weakness/numbness/paresthesias in any extremity   Physical Exam  Triage Vital Signs: ED Triage Vitals  Enc Vitals Group     BP 07/29/22 1220 (!) 194/90     Pulse Rate 07/29/22 1220 63     Resp 07/29/22 1220 19     Temp 07/29/22 1220 98 F (36.7 C)     Temp src --      SpO2 07/29/22 1220 97 %     Weight 07/29/22 1452 189 lb 6 oz (85.9 kg)     Height 07/29/22 1452 '5\' 4"'$  (1.626 m)     Head Circumference --      Peak Flow --      Pain Score 07/29/22 1218 9     Pain Loc --      Pain Edu? --      Excl. in [place]? --    Most recent vital signs: Vitals:   07/29/22 1220 07/29/22 1453  BP: (!) 194/90 (!) 188/88  Pulse: 63 60  Resp: 19 18  Temp: 98 F (36.7 C)   SpO2: 97% 98%   General: Awake, oriented x4. CV:  Good peripheral perfusion.  Resp:  Normal effort.  Abd:  No distention.  Other:  Middle-aged Caucasian female sitting in chair in exam room in no acute distress ED Results / Procedures / Treatments  Labs (all labs ordered are listed, but only abnormal results are displayed) Labs  Reviewed  BASIC METABOLIC PANEL - Abnormal; Notable for the following components:      Result Value   Glucose, Bld 118 (*)    All other components within normal limits  CBC  TROPONIN I (HIGH SENSITIVITY)  TROPONIN I (HIGH SENSITIVITY)   EKG ED ECG REPORT I, Naaman Plummer, the attending physician, personally viewed and interpreted this ECG. Date: 07/29/2022 EKG Time: 1216 Rate: 66 Rhythm: normal sinus rhythm QRS Axis: normal Intervals: normal ST/T Wave abnormalities: normal Narrative Interpretation: no evidence of acute ischemia RADIOLOGY ED MD interpretation: 2 view chest x-ray interpreted by me shows no evidence of acute abnormalities including no pneumonia, pneumothorax, or widened mediastinum -Agree with radiology assessment Official radiology report(s): DG Chest 2 View  Result Date: 07/29/2022 CLINICAL DATA:  Chest pain EXAM: CHEST - 2 VIEW COMPARISON:  01/21/2021 FINDINGS: Shallow inspiration with low lung volumes. No consolidation or edema. No pleural effusion or pneumothorax. Cardiomediastinal silhouette is within normal limits. No acute osseous abnormality. IMPRESSION: No acute process in the chest. Electronically Signed   By: Macy Mis M.D.   On: 07/29/2022 12:45   PROCEDURES: Critical Care performed: No .1-3 Lead EKG Interpretation  Performed by: Cheri Fowler,  Vista Lawman, MD Authorized by: Naaman Plummer, MD     Interpretation: normal     ECG rate:  61   ECG rate assessment: normal     Rhythm: sinus rhythm     Ectopy: none     Conduction: normal    MEDICATIONS ORDERED IN ED: Medications - No data to display IMPRESSION / MDM / [place] / ED COURSE  I reviewed the triage vital signs and the nursing notes.                             The patient is on the cardiac monitor to evaluate for evidence of arrhythmia and/or significant heart rate changes. Patient's presentation is most consistent with acute presentation with potential threat to life or bodily  function. Workup: ECG, CXR, CBC, BMP, Troponin Findings: ECG: No overt evidence of STEMI. No evidence of Brugadas sign, delta wave, epsilon wave, significantly prolonged QTc, or malignant arrhythmia HS Troponin: Negative x1 Other Labs unremarkable for emergent problems. CXR: Without PTX, PNA, or widened mediastinum Last Stress Test: Never Last Heart Catheterization: Never HEART Score: 4  Given History, Exam, and Workup I have low suspicion for ACS, Pneumothorax, Pneumonia, Pulmonary Embolus, Tamponade, Aortic Dissection or other emergent problem as a cause for this presentation.   Reassesment: Prior to discharge patients pain was controlled and they were well appearing.  Disposition:  Discharge. Strict return precautions discussed with patient with full understanding. Advised patient to follow up promptly with primary care provider    FINAL CLINICAL IMPRESSION(S) / ED DIAGNOSES   Final diagnoses:  Nonspecific chest pain  Shortness of breath   Rx / DC Orders   ED Discharge Orders          Ordered    Ambulatory referral to Cardiology       Comments: If you have not heard from the Cardiology office within the next 72 hours please call [phone].   07/29/22 1531           Note:  This document was prepared using Dragon voice recognition software and may include unintentional dictation errors.   Naaman Plummer, MD 07/29/22 [phone]

## 2022-07-30 ENCOUNTER — Telehealth: Payer: Self-pay | Admitting: Cardiovascular Disease

## 2022-07-30 NOTE — Telephone Encounter (Signed)
  Pt said, she was at [facility] ED yesterday and was told she needs stress test. She has apt with Dr. Acie Fredrickson on 09/14 and wanted to get stress test on 09/13 since she will leave on a trip next week. Need order for stress test

## 2022-07-31 NOTE — Telephone Encounter (Signed)
Returned call to patient, no answer, left detailed message. Patient has been seen in ED at [facility] for chest pain and was advised for close follow-up with cardiology for stress test. Informed pt that we can move up her currently scheduled appt. [facility] has several openings for Wed 9/6. She needs to be seen sooner by MD, then we can order stress test if deemed appropriate.

## 2022-08-14 ENCOUNTER — Ambulatory Visit: Payer: Self-pay | Admitting: Cardiovascular Disease

## 2022-08-19 ENCOUNTER — Ambulatory Visit: Payer: Self-pay | Admitting: Cardiovascular Disease

## 2022-08-25 NOTE — Progress Notes (Deleted)
Cardiology Office Note:    Date:  08/25/2022   ID:  Kathleen Perkins, DOB [birth year], MRN [id number]  PCP:  Virginia Crews, MD   [facility] Providers Cardiologist:  None { Click to update primary MD,subspecialty MD or APP then REFRESH:1}    Referring MD: Virginia Crews, MD   Chief Complaint: ***  History of Present Illness:    Kathleen Perkins is a *** 61 y.o. female with a hx of MVP, palpitations, HLD  Her last visit to cardiology clinic was with Dr. Acie Fredrickson on 12/07/2020.  She reported palpitations that had worsened with menopause and stress.  Episodes of tachycardia would last perhaps up to 30 minutes.  She was encouraged to resume regular exercise routine which had previously helped with palpitations and to take propanolol 10 mg up to 4 times daily as needed.  Advised to follow-up in 6 months.  Seen in [facility] for chest pain 07/29/22.  She reported left upper chest pain that radiated into her left arm lasting for a couple of hours.  Had chest pain similar to this in the past, however it had never been this acute.  Pain 9/10 crushing/pressure pain that did not worsen or improve with exertion.  Also associated with shortness of breath.  EKG revealed no overt evidence of STEMI, ACS. Hs troponin 6.  She was advised to follow-up with cardiology as an outpatient.  Today, she is here Calciumscore/CCTA?  Past Medical History:  Diagnosis Date   Anxiety    Melanoma of upper arm ([place])    MVP (mitral valve prolapse)    Primary osteoarthritis of left knee 08/15/2014    Past Surgical History:  Procedure Laterality Date   ARTHROSCOPIC REPAIR ACL     BREAST BIOPSY Left 06/23/2013   u/s bx- neg   BREAST EXCISIONAL BIOPSY Left 07/14/2013   us/bx-neg   MELANOMA EXCISION     left arm.    REPLACEMENT TOTAL KNEE     left     Current Medications: No outpatient medications have been marked as taking for the 08/28/22 encounter (Appointment) with Ann Maki, Lanice Schwab, NP.      Allergies:   Codeine, Erythromycin, Nsaids, and Ibuprofen   Social History   Socioeconomic History   Marital status: Divorced    Spouse name: Not on file   Number of children: Not on file   Years of education: Not on file   Highest education level: Not on file  Occupational History   Not on file  Tobacco Use   Smoking status: Never   Smokeless tobacco: Never  Substance and Sexual Activity   Alcohol use: No   Drug use: No   Sexual activity: Not on file  Other Topics Concern   Not on file  Social History Narrative   Not on file   Social Determinants of Health   Financial Resource Strain: Not on file  Food Insecurity: Not on file  Transportation Needs: Not on file  Physical Activity: Not on file  Stress: Not on file  Social Connections: Not on file     Family History: The patient's ***family history includes Heart attack in her brother; Heart disease in her brother and father; Hyperlipidemia in her brother and father; Hypertension in her brother and father. There is no history of Breast cancer.  ROS:   Please see the history of present illness.    *** All other systems reviewed and are negative.  Labs/Other Studies Reviewed:    The following  studies were reviewed today:   Recent Labs: 07/29/2022: BUN 11; Creatinine, Ser 0.85; Hemoglobin 15.0; Platelets 349; Potassium 3.6; Sodium 135  Recent Lipid Panel    Component Value Date/Time   CHOL 268 (H) 11/08/2020 1423   TRIG 175 (H) 11/08/2020 1423   HDL 93 11/08/2020 1423   CHOLHDL 2.9 11/08/2020 1423   LDLCALC 145 (H) 11/08/2020 1423     Risk Assessment/Calculations:   {Does this patient have ATRIAL FIBRILLATION?:[id number]}       Physical Exam:    VS:  There were no vitals taken for this visit.    Wt Readings from Last 3 Encounters:  07/29/22 189 lb 6 oz (85.9 kg)  11/07/21 189 lb 6.4 oz (85.9 kg)  12/07/20 188 lb 9.6 oz (85.5 kg)     GEN: *** Well nourished, well developed in no acute  distress HEENT: Normal NECK: No JVD; No carotid bruits CARDIAC: ***RRR, no murmurs, rubs, gallops RESPIRATORY:  Clear to auscultation without rales, wheezing or rhonchi  ABDOMEN: Soft, non-tender, non-distended MUSCULOSKELETAL:  No edema; No deformity. *** pedal pulses, ***bilaterally SKIN: Warm and dry NEUROLOGIC:  Alert and oriented x 3 PSYCHIATRIC:  Normal affect   EKG:  EKG is *** ordered today.  The ekg ordered today demonstrates ***  No BP recorded.  {Refresh Note OR Click here to enter BP  :1}***    Diagnoses:    No diagnosis found. Assessment and Plan:       {Are you ordering a CV Procedure (e.g. stress test, cath, DCCV, TEE, etc)?   Press F2        :[id number]}   Disposition:  Medication Adjustments/Labs and Tests Ordered: Current medicines are reviewed at length with the patient today.  Concerns regarding medicines are outlined above.  No orders of the defined types were placed in this encounter.  No orders of the defined types were placed in this encounter.   There are no Patient Instructions on file for this visit.   Signed, Emmaline Life, NP  08/25/2022 8:53 AM    [place]

## 2022-08-26 ENCOUNTER — Inpatient Hospital Stay (HOSPITAL_COMMUNITY)
Admit: 2022-08-26 | Discharge: 2022-08-26 | Disposition: A | Discharge status: Home / Self Care | Payer: BC Managed Care – PPO | Attending: Pulmonary Disease | Admitting: Pulmonary Disease

## 2022-08-26 ENCOUNTER — Inpatient Hospital Stay
Admission: EM | Admit: 2022-08-26 | Discharge: 2022-08-29 | DRG: 062 | Disposition: A | Discharge status: Home / Self Care | Payer: BC Managed Care – PPO | Attending: Hospitalist | Admitting: Hospitalist

## 2022-08-26 ENCOUNTER — Emergency Department: Payer: BC Managed Care – PPO

## 2022-08-26 ENCOUNTER — Other Ambulatory Visit: Payer: Self-pay

## 2022-08-26 ENCOUNTER — Inpatient Hospital Stay: Payer: BC Managed Care – PPO

## 2022-08-26 DIAGNOSIS — E876 Hypokalemia: Secondary | ICD-10-CM | POA: Diagnosis present

## 2022-08-26 DIAGNOSIS — Z9189 Other specified personal risk factors, not elsewhere classified: Secondary | ICD-10-CM | POA: Diagnosis not present

## 2022-08-26 DIAGNOSIS — Z79899 Other long term (current) drug therapy: Secondary | ICD-10-CM | POA: Diagnosis not present

## 2022-08-26 DIAGNOSIS — I6389 Other cerebral infarction: Secondary | ICD-10-CM | POA: Diagnosis not present

## 2022-08-26 DIAGNOSIS — R29707 NIHSS score 7: Secondary | ICD-10-CM | POA: Diagnosis present

## 2022-08-26 DIAGNOSIS — T886XXA Anaphylactic reaction due to adverse effect of correct drug or medicament properly administered, initial encounter: Secondary | ICD-10-CM | POA: Diagnosis present

## 2022-08-26 DIAGNOSIS — R600 Localized edema: Secondary | ICD-10-CM | POA: Diagnosis not present

## 2022-08-26 DIAGNOSIS — R471 Dysarthria and anarthria: Secondary | ICD-10-CM | POA: Diagnosis present

## 2022-08-26 DIAGNOSIS — Z8673 Personal history of transient ischemic attack (TIA), and cerebral infarction without residual deficits: Secondary | ICD-10-CM | POA: Diagnosis not present

## 2022-08-26 DIAGNOSIS — E871 Hypo-osmolality and hyponatremia: Secondary | ICD-10-CM | POA: Diagnosis present

## 2022-08-26 DIAGNOSIS — I6340 Cerebral infarction due to embolism of unspecified cerebral artery: Secondary | ICD-10-CM | POA: Diagnosis not present

## 2022-08-26 DIAGNOSIS — T782XXA Anaphylactic shock, unspecified, initial encounter: Secondary | ICD-10-CM

## 2022-08-26 DIAGNOSIS — I160 Hypertensive urgency: Secondary | ICD-10-CM | POA: Diagnosis not present

## 2022-08-26 DIAGNOSIS — I672 Cerebral atherosclerosis: Secondary | ICD-10-CM | POA: Diagnosis not present

## 2022-08-26 DIAGNOSIS — I639 Cerebral infarction, unspecified: Secondary | ICD-10-CM

## 2022-08-26 DIAGNOSIS — Z96652 Presence of left artificial knee joint: Secondary | ICD-10-CM | POA: Diagnosis present

## 2022-08-26 DIAGNOSIS — I10 Essential (primary) hypertension: Secondary | ICD-10-CM | POA: Diagnosis present

## 2022-08-26 DIAGNOSIS — F3160 Bipolar disorder, current episode mixed, unspecified: Secondary | ICD-10-CM | POA: Diagnosis present

## 2022-08-26 DIAGNOSIS — G319 Degenerative disease of nervous system, unspecified: Secondary | ICD-10-CM | POA: Diagnosis not present

## 2022-08-26 DIAGNOSIS — I6310 Cerebral infarction due to embolism of unspecified precerebral artery: Secondary | ICD-10-CM | POA: Diagnosis not present

## 2022-08-26 DIAGNOSIS — M47812 Spondylosis without myelopathy or radiculopathy, cervical region: Secondary | ICD-10-CM | POA: Diagnosis not present

## 2022-08-26 DIAGNOSIS — I480 Paroxysmal atrial fibrillation: Secondary | ICD-10-CM

## 2022-08-26 DIAGNOSIS — Z881 Allergy status to other antibiotic agents status: Secondary | ICD-10-CM | POA: Diagnosis not present

## 2022-08-26 DIAGNOSIS — I083 Combined rheumatic disorders of mitral, aortic and tricuspid valves: Secondary | ICD-10-CM | POA: Diagnosis present

## 2022-08-26 DIAGNOSIS — Z85820 Personal history of malignant melanoma of skin: Secondary | ICD-10-CM | POA: Diagnosis not present

## 2022-08-26 DIAGNOSIS — Z91041 Radiographic dye allergy status: Secondary | ICD-10-CM | POA: Diagnosis not present

## 2022-08-26 DIAGNOSIS — Z885 Allergy status to narcotic agent status: Secondary | ICD-10-CM | POA: Diagnosis not present

## 2022-08-26 DIAGNOSIS — Z83438 Family history of other disorder of lipoprotein metabolism and other lipidemia: Secondary | ICD-10-CM

## 2022-08-26 DIAGNOSIS — Z20822 Contact with and (suspected) exposure to covid-19: Secondary | ICD-10-CM | POA: Diagnosis present

## 2022-08-26 DIAGNOSIS — E785 Hyperlipidemia, unspecified: Secondary | ICD-10-CM | POA: Diagnosis present

## 2022-08-26 DIAGNOSIS — R4701 Aphasia: Secondary | ICD-10-CM

## 2022-08-26 DIAGNOSIS — Z886 Allergy status to analgesic agent status: Secondary | ICD-10-CM

## 2022-08-26 DIAGNOSIS — F419 Anxiety disorder, unspecified: Secondary | ICD-10-CM | POA: Diagnosis present

## 2022-08-26 DIAGNOSIS — Z8249 Family history of ischemic heart disease and other diseases of the circulatory system: Secondary | ICD-10-CM

## 2022-08-26 DIAGNOSIS — R29810 Facial weakness: Secondary | ICD-10-CM | POA: Diagnosis present

## 2022-08-26 DIAGNOSIS — I4891 Unspecified atrial fibrillation: Secondary | ICD-10-CM | POA: Diagnosis present

## 2022-08-26 DIAGNOSIS — T508X5A Adverse effect of diagnostic agents, initial encounter: Secondary | ICD-10-CM | POA: Diagnosis present

## 2022-08-26 DIAGNOSIS — H539 Unspecified visual disturbance: Secondary | ICD-10-CM | POA: Diagnosis not present

## 2022-08-26 HISTORY — DX: Cerebral infarction, unspecified: I63.9

## 2022-08-26 LAB — CBC
HCT: 40.8 % (ref 36.0–46.0)
Hemoglobin: 13.4 g/dL (ref 12.0–15.0)
MCH: 30.2 pg (ref 26.0–34.0)
MCHC: 32.8 g/dL (ref 30.0–36.0)
MCV: 92.1 fL (ref 80.0–100.0)
Platelets: 381 10*3/uL (ref 150–400)
RBC: 4.43 MIL/uL (ref 3.87–5.11)
RDW: 13.2 % (ref 11.5–15.5)
WBC: 9.7 10*3/uL (ref 4.0–10.5)
nRBC: 0 % (ref 0.0–0.2)

## 2022-08-26 LAB — COMPREHENSIVE METABOLIC PANEL
ALT: 19 U/L (ref 0–44)
AST: 23 U/L (ref 15–41)
Albumin: 4.5 g/dL (ref 3.5–5.0)
Alkaline Phosphatase: 77 U/L (ref 38–126)
Anion gap: 10 (ref 5–15)
BUN: 16 mg/dL (ref 6–20)
CO2: 24 mmol/L (ref 22–32)
Calcium: 8.9 mg/dL (ref 8.9–10.3)
Chloride: 98 mmol/L (ref 98–111)
Creatinine, Ser: 0.92 mg/dL (ref 0.44–1.00)
GFR, Estimated: >60 mL/min (ref >60)
Glucose, Bld: 124 mg/dL — ABNORMAL HIGH (ref 70–99)
Potassium: 3.4 mmol/L — ABNORMAL LOW (ref 3.5–5.1)
Sodium: 132 mmol/L — ABNORMAL LOW (ref 135–145)
Total Bilirubin: 0.7 mg/dL (ref 0.3–1.2)
Total Protein: 7.8 g/dL (ref 6.5–8.1)

## 2022-08-26 LAB — URINALYSIS, ROUTINE W REFLEX MICROSCOPIC
Bacteria, UA: NONE SEEN
Bilirubin Urine: NEGATIVE
Glucose, UA: NEGATIVE mg/dL
Ketones, ur: NEGATIVE mg/dL
Leukocytes,Ua: NEGATIVE
Nitrite: NEGATIVE
Protein, ur: NEGATIVE mg/dL
Specific Gravity, Urine: 1.021 (ref 1.005–1.030)
pH: 5 (ref 5.0–8.0)

## 2022-08-26 LAB — MRSA NEXT GEN BY PCR, NASAL: MRSA by PCR Next Gen: NOT DETECTED

## 2022-08-26 LAB — CBG MONITORING, ED: Glucose-Capillary: 102 mg/dL — ABNORMAL HIGH (ref 70–99)

## 2022-08-26 LAB — RESP PANEL BY RT-PCR (FLU A&B, COVID) ARPGX2
Influenza A by PCR: NEGATIVE
Influenza B by PCR: NEGATIVE
SARS Coronavirus 2 by RT PCR: NEGATIVE

## 2022-08-26 LAB — LIPID PANEL
Cholesterol: 223 mg/dL — ABNORMAL HIGH (ref 0–200)
HDL: 87 mg/dL (ref >40)
LDL Cholesterol: 113 mg/dL — ABNORMAL HIGH (ref 0–99)
Total CHOL/HDL Ratio: 2.6 RATIO
Triglycerides: 117 mg/dL (ref <150)
VLDL: 23 mg/dL (ref 0–40)

## 2022-08-26 LAB — APTT: aPTT: 28 seconds (ref 24–36)

## 2022-08-26 LAB — DIFFERENTIAL
Abs Immature Granulocytes: 0.01 10*3/uL (ref 0.00–0.07)
Basophils Absolute: 0 10*3/uL (ref 0.0–0.1)
Basophils Relative: 0 %
Eosinophils Absolute: 0.1 10*3/uL (ref 0.0–0.5)
Eosinophils Relative: 1 %
Immature Granulocytes: 0 %
Lymphocytes Relative: 29 %
Lymphs Abs: 2.8 10*3/uL (ref 0.7–4.0)
Monocytes Absolute: 0.8 10*3/uL (ref 0.1–1.0)
Monocytes Relative: 9 %
Neutro Abs: 6 10*3/uL (ref 1.7–7.7)
Neutrophils Relative %: 61 %

## 2022-08-26 LAB — PHOSPHORUS: Phosphorus: 4.4 mg/dL (ref 2.5–4.6)

## 2022-08-26 LAB — URINE DRUG SCREEN, QUALITATIVE (ARMC ONLY)
Amphetamines, Ur Screen: NOT DETECTED
Barbiturates, Ur Screen: NOT DETECTED
Benzodiazepine, Ur Scrn: NOT DETECTED
Cannabinoid 50 Ng, Ur ~~LOC~~: NOT DETECTED
Cocaine Metabolite,Ur ~~LOC~~: NOT DETECTED
MDMA (Ecstasy)Ur Screen: NOT DETECTED
Methadone Scn, Ur: NOT DETECTED
Opiate, Ur Screen: NOT DETECTED
Phencyclidine (PCP) Ur S: NOT DETECTED
Tricyclic, Ur Screen: NOT DETECTED

## 2022-08-26 LAB — GLUCOSE, CAPILLARY: Glucose-Capillary: 160 mg/dL — ABNORMAL HIGH (ref 70–99)

## 2022-08-26 LAB — ETHANOL: Alcohol, Ethyl (B): 33 mg/dL — ABNORMAL HIGH (ref <10)

## 2022-08-26 LAB — PROTIME-INR
INR: 1 (ref 0.8–1.2)
Prothrombin Time: 12.8 seconds (ref 11.4–15.2)

## 2022-08-26 LAB — MAGNESIUM: Magnesium: 2.1 mg/dL (ref 1.7–2.4)

## 2022-08-26 LAB — HEMOGLOBIN A1C
Hgb A1c MFr Bld: 5.6 % (ref 4.8–5.6)
Mean Plasma Glucose: 114.02 mg/dL

## 2022-08-26 LAB — BRAIN NATRIURETIC PEPTIDE: B Natriuretic Peptide: 327.3 pg/mL — ABNORMAL HIGH (ref 0.0–100.0)

## 2022-08-26 LAB — HIV ANTIBODY (ROUTINE TESTING W REFLEX): HIV Screen 4th Generation wRfx: NONREACTIVE

## 2022-08-26 LAB — TSH: TSH: 0.443 u[IU]/mL (ref 0.350–4.500)

## 2022-08-26 LAB — D-DIMER, QUANTITATIVE: D-Dimer, Quant: 3.75 ug/mL-FEU — ABNORMAL HIGH (ref 0.00–0.50)

## 2022-08-26 LAB — TROPONIN I (HIGH SENSITIVITY)
Troponin I (High Sensitivity): 7 ng/L (ref <18)
Troponin I (High Sensitivity): 15 ng/L (ref <18)

## 2022-08-26 MED ORDER — CLONAZEPAM 0.25 MG PO TBDP
0.2500 mg | ORAL_TABLET | Freq: Two times a day (BID) | ORAL | Status: DC | PRN
  Administered 2022-08-26 (×2): 0.25 mg via ORAL
  Filled 2022-08-26 (×2): qty 1

## 2022-08-26 MED ORDER — DOCUSATE SODIUM 100 MG PO CAPS: 100.0000 mg | ORAL_CAPSULE | Freq: Two times a day (BID) | ORAL | Status: DC | PRN

## 2022-08-26 MED ORDER — IPRATROPIUM-ALBUTEROL 0.5-2.5 (3) MG/3ML IN SOLN
RESPIRATORY_TRACT | Status: AC
  Administered 2022-08-26: 3 mL via RESPIRATORY_TRACT
  Filled 2022-08-26: qty 3

## 2022-08-26 MED ORDER — DIPHENHYDRAMINE HCL 50 MG/ML IJ SOLN
25.0000 mg | Freq: Once | INTRAMUSCULAR | Status: AC
  Administered 2022-08-26: 25 mg via INTRAVENOUS

## 2022-08-26 MED ORDER — NICARDIPINE HCL IN NACL 20-0.86 MG/200ML-% IV SOLN
0.0000 mg/h | INTRAVENOUS | Status: DC
  Administered 2022-08-26: 2.5 mg/h via INTRAVENOUS
  Filled 2022-08-26: qty 200

## 2022-08-26 MED ORDER — PROPRANOLOL HCL 20 MG PO TABS
10.0000 mg | ORAL_TABLET | Freq: Four times a day (QID) | ORAL | Status: DC | PRN
  Administered 2022-08-26 (×2): 10 mg via ORAL
  Filled 2022-08-26 (×2): qty 1

## 2022-08-26 MED ORDER — FOLIC ACID 1 MG PO TABS
1.0000 mg | ORAL_TABLET | Freq: Every day | ORAL | Status: DC
  Administered 2022-08-26 – 2022-08-29 (×4): 1 mg via ORAL
  Filled 2022-08-26 (×4): qty 1

## 2022-08-26 MED ORDER — LORAZEPAM 1 MG PO TABS: 0.0000 mg | ORAL_TABLET | Freq: Three times a day (TID) | ORAL | Status: DC

## 2022-08-26 MED ORDER — CHLORHEXIDINE GLUCONATE CLOTH 2 % EX PADS
6.0000 | MEDICATED_PAD | Freq: Every day | CUTANEOUS | Status: DC
  Administered 2022-08-26 – 2022-08-29 (×4): 6 via TOPICAL

## 2022-08-26 MED ORDER — HYDROXYZINE HCL 10 MG PO TABS
10.0000 mg | ORAL_TABLET | Freq: Three times a day (TID) | ORAL | Status: DC | PRN
  Administered 2022-08-26 – 2022-08-29 (×3): 10 mg via ORAL
  Filled 2022-08-26 (×4): qty 1

## 2022-08-26 MED ORDER — METOPROLOL TARTRATE 5 MG/5ML IV SOLN
5.0000 mg | Freq: Once | INTRAVENOUS | Status: AC
  Administered 2022-08-26: 5 mg via INTRAVENOUS
  Filled 2022-08-26: qty 5

## 2022-08-26 MED ORDER — IPRATROPIUM-ALBUTEROL 0.5-2.5 (3) MG/3ML IN SOLN: 3.0000 mL | Freq: Once | RESPIRATORY_TRACT | Status: AC

## 2022-08-26 MED ORDER — METHYLPREDNISOLONE SODIUM SUCC 125 MG IJ SOLR
125.0000 mg | Freq: Once | INTRAMUSCULAR | Status: AC
  Administered 2022-08-26: 125 mg via INTRAVENOUS

## 2022-08-26 MED ORDER — POLYETHYLENE GLYCOL 3350 17 G PO PACK
17.0000 g | PACK | Freq: Every day | ORAL | Status: DC | PRN
  Administered 2022-08-28 – 2022-08-29 (×2): 17 g via ORAL
  Filled 2022-08-26 (×3): qty 1

## 2022-08-26 MED ORDER — ORAL CARE MOUTH RINSE: 15.0000 mL | OROMUCOSAL | Status: DC | PRN

## 2022-08-26 MED ORDER — LABETALOL HCL 5 MG/ML IV SOLN
10.0000 mg | Freq: Once | INTRAVENOUS | Status: AC
  Administered 2022-08-26: 10 mg via INTRAVENOUS

## 2022-08-26 MED ORDER — TENECTEPLASE FOR STROKE: 0.2500 mg/kg | PACK | Freq: Once | INTRAVENOUS | Status: AC

## 2022-08-26 MED ORDER — STERILE WATER FOR INJECTION IJ SOLN
INTRAMUSCULAR | Status: AC
  Administered 2022-08-26: 2 mL
  Filled 2022-08-26: qty 10

## 2022-08-26 MED ORDER — LORAZEPAM 2 MG/ML IJ SOLN
1.0000 mg | Freq: Once | INTRAMUSCULAR | Status: AC
  Administered 2022-08-26: 1 mg via INTRAVENOUS
  Filled 2022-08-26: qty 1

## 2022-08-26 MED ORDER — EPINEPHRINE 0.3 MG/0.3ML IJ SOAJ: 0.3000 mg | Freq: Once | INTRAMUSCULAR | Status: AC

## 2022-08-26 MED ORDER — ADULT MULTIVITAMIN W/MINERALS CH
1.0000 | ORAL_TABLET | Freq: Every day | ORAL | Status: DC
  Administered 2022-08-26 – 2022-08-28 (×3): 1 via ORAL
  Filled 2022-08-26 (×2): qty 1

## 2022-08-26 MED ORDER — LABETALOL HCL 5 MG/ML IV SOLN
INTRAVENOUS | Status: AC
  Administered 2022-08-26: 10 mg
  Filled 2022-08-26: qty 4

## 2022-08-26 MED ORDER — METOPROLOL TARTRATE 25 MG PO TABS
12.5000 mg | ORAL_TABLET | Freq: Four times a day (QID) | ORAL | Status: DC
  Administered 2022-08-26 (×2): 12.5 mg via ORAL
  Filled 2022-08-26 (×2): qty 1

## 2022-08-26 MED ORDER — ATORVASTATIN CALCIUM 20 MG PO TABS
40.0000 mg | ORAL_TABLET | Freq: Every day | ORAL | Status: DC
  Administered 2022-08-26 – 2022-08-29 (×4): 40 mg via ORAL
  Filled 2022-08-26 (×4): qty 2

## 2022-08-26 MED ORDER — THIAMINE MONONITRATE 100 MG PO TABS
100.0000 mg | ORAL_TABLET | Freq: Every day | ORAL | Status: DC
  Administered 2022-08-26 – 2022-08-29 (×4): 100 mg via ORAL
  Filled 2022-08-26 (×4): qty 1

## 2022-08-26 MED ORDER — IOHEXOL 350 MG/ML SOLN
100.0000 mL | Freq: Once | INTRAVENOUS | Status: AC | PRN
  Administered 2022-08-26: 100 mL via INTRAVENOUS

## 2022-08-26 MED ORDER — METOPROLOL TARTRATE 5 MG/5ML IV SOLN
5.0000 mg | Freq: Four times a day (QID) | INTRAVENOUS | Status: DC | PRN
  Administered 2022-08-26: 5 mg via INTRAVENOUS
  Filled 2022-08-26 (×2): qty 5

## 2022-08-26 MED ORDER — METHYLPREDNISOLONE SODIUM SUCC 125 MG IJ SOLR
INTRAMUSCULAR | Status: AC
  Filled 2022-08-26: qty 2

## 2022-08-26 MED ORDER — METOPROLOL TARTRATE 25 MG PO TABS
25.0000 mg | ORAL_TABLET | Freq: Four times a day (QID) | ORAL | Status: DC
  Administered 2022-08-27 (×2): 25 mg via ORAL
  Filled 2022-08-26 (×2): qty 1

## 2022-08-26 MED ORDER — LORAZEPAM 2 MG/ML IJ SOLN
1.0000 mg | INTRAMUSCULAR | Status: DC | PRN
  Administered 2022-08-27: 2 mg via INTRAVENOUS
  Filled 2022-08-26: qty 1

## 2022-08-26 MED ORDER — POTASSIUM CHLORIDE CRYS ER 20 MEQ PO TBCR
40.0000 meq | EXTENDED_RELEASE_TABLET | Freq: Once | ORAL | Status: AC
  Administered 2022-08-26: 40 meq via ORAL
  Filled 2022-08-26: qty 2

## 2022-08-26 MED ORDER — LORAZEPAM 1 MG PO TABS
0.0000 mg | ORAL_TABLET | ORAL | Status: AC
  Administered 2022-08-27: 2 mg via ORAL
  Administered 2022-08-28: 1 mg via ORAL
  Administered 2022-08-28: 2 mg via ORAL
  Administered 2022-08-28: 1 mg via ORAL
  Administered 2022-08-28 – 2022-08-29 (×2): 2 mg via ORAL
  Filled 2022-08-26 (×2): qty 2
  Filled 2022-08-26: qty 1
  Filled 2022-08-26: qty 4
  Filled 2022-08-26: qty 1
  Filled 2022-08-26: qty 2

## 2022-08-26 MED ORDER — PANTOPRAZOLE SODIUM 40 MG PO TBEC
40.0000 mg | DELAYED_RELEASE_TABLET | Freq: Every day | ORAL | Status: DC
  Administered 2022-08-26 – 2022-08-29 (×4): 40 mg via ORAL
  Filled 2022-08-26 (×4): qty 1

## 2022-08-26 MED ORDER — ACETAMINOPHEN 325 MG PO TABS: 650.0000 mg | ORAL_TABLET | ORAL | Status: DC | PRN

## 2022-08-26 MED ORDER — TENECTEPLASE FOR STROKE
PACK | INTRAVENOUS | Status: AC
  Administered 2022-08-26: 21 mg via INTRAVENOUS
  Filled 2022-08-26: qty 10

## 2022-08-26 MED ORDER — LORAZEPAM 1 MG PO TABS
1.0000 mg | ORAL_TABLET | ORAL | Status: DC | PRN
  Administered 2022-08-27: 1 mg via ORAL
  Filled 2022-08-26: qty 1

## 2022-08-26 MED ORDER — STROKE: EARLY STAGES OF RECOVERY BOOK: Freq: Once | Status: AC

## 2022-08-26 MED ORDER — LAMOTRIGINE 25 MG PO TABS
100.0000 mg | ORAL_TABLET | Freq: Two times a day (BID) | ORAL | Status: DC
  Administered 2022-08-26 – 2022-08-29 (×7): 100 mg via ORAL
  Filled 2022-08-26 (×7): qty 4

## 2022-08-26 MED ORDER — FAMOTIDINE IN NACL 20-0.9 MG/50ML-% IV SOLN
20.0000 mg | Freq: Once | INTRAVENOUS | Status: AC
  Administered 2022-08-26: 20 mg via INTRAVENOUS
  Filled 2022-08-26: qty 50

## 2022-08-26 MED ORDER — EPINEPHRINE 0.3 MG/0.3ML IJ SOAJ
INTRAMUSCULAR | Status: AC
  Administered 2022-08-26: 0.3 mg via INTRAMUSCULAR
  Filled 2022-08-26: qty 0.3

## 2022-08-26 MED ORDER — DIPHENHYDRAMINE HCL 50 MG/ML IJ SOLN
INTRAMUSCULAR | Status: AC
  Filled 2022-08-26: qty 1

## 2022-08-26 NOTE — ED Provider Notes (Signed)
[facility] Provider Note    Event Date/Time   First MD Initiated Contact with Patient 08/26/22 0225     (approximate)   History   Stroke symptoms   HPI  Level V caveat: Limited by aphasia  Kathleen Perkins is a 61 y.o. female who presents to the ED accompanied by her husband with a chief complaint of possible stroke.  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  She had a beer and chatted with her husband.  They had finished talking approximately 20 minutes before 2 AM when she got ready for bed and husband heard her scream.  Found her to have intelligible speech at that time.  Patient denies fall or striking head.  Not had similar symptoms previously.  Denies chest pain, shortness of breath, abdominal pain, vomiting or diarrhea.    Past Medical History   Past Medical History:  Diagnosis Date  . Anxiety   . Melanoma of upper arm ([place])   . MVP (mitral valve prolapse)   . Primary osteoarthritis of left knee 08/15/2014     Active Problem List   Patient Active Problem List   Diagnosis Date Noted  . Ischemic cerebrovascular accident (CVA) ([place]) 08/26/2022  . Class 1 obesity without serious comorbidity with body mass index (BMI) of 32.0 to 32.9 in adult 11/07/2021  . Hyperlipidemia 11/12/2018  . B12 deficiency 11/12/2018  . Avitaminosis D 11/12/2018  . Chronic cervical pain 02/25/2017  . History of melanoma 02/25/2017  . Mitral valve prolapse 02/25/2017  . Bipolar affective ([place]) 11/26/2016  . Anxiety and depression 06/01/2015  . Arrhythmia 06/01/2015     Past Surgical History   Past Surgical History:  Procedure Laterality Date  . ARTHROSCOPIC REPAIR ACL    . BREAST BIOPSY Left 06/23/2013   u/s bx- neg  . BREAST EXCISIONAL BIOPSY Left 07/14/2013   us/bx-neg  . MELANOMA EXCISION     left arm.   . REPLACEMENT TOTAL KNEE     left      Home Medications   Prior to Admission medications   Medication Sig Start Date End  Date Taking? Authorizing Provider  amoxicillin (AMOXIL) 500 MG capsule Take by mouth. 08/25/22   [provider]  atenolol (TENORMIN) 25 MG tablet Take 1 tablet (25 mg total) by mouth 2 (two) times daily as needed. 06/20/22   Gwyneth Sprout, FNP  clonazePAM (KLONOPIN) 0.5 MG tablet Take 0.5 tablets (0.25 mg total) by mouth 2 (two) times daily as needed for anxiety. Due for in office follow up. 06/13/22   Gwyneth Sprout, FNP  hydrochlorothiazide (HYDRODIURIL) 12.5 MG tablet Take 1 tablet (12.5 mg total) by mouth daily. 01/29/21   Virginia Crews, MD  hydrOXYzine (ATARAX) 10 MG tablet TAKE ONE TABLET BY MOUTH THREE TIMES A DAY AS NEEDED 04/29/22   Bacigalupo, Dionne Bucy, MD  lamoTRIgine (LAMICTAL) 100 MG tablet TAKE ONE TABLET BY MOUTH TWICE A DAY 04/29/22   Bacigalupo, Dionne Bucy, MD  lamoTRIgine (LAMICTAL) 150 MG tablet Take 1 tablet (150 mg total) by mouth 2 (two) times daily. 10/07/21   Bacigalupo, Dionne Bucy, MD  ondansetron (ZOFRAN) 4 MG tablet Take 1 tablet (4 mg total) by mouth every 8 (eight) hours as needed for nausea or vomiting. 01/29/21   Virginia Crews, MD  propranolol (INDERAL) 10 MG tablet Take 1 tablet (10 mg total) by mouth 4 (four) times daily as needed. CALL AND SCHEDULE OVERDUE  APPOINTMENT TO RECEIVE FURTHER REFILLS. [phone]. 1ST ATTEMPT. 06/30/22   Nahser, Wonda Cheng, MD  traZODone (DESYREL) 100 MG tablet Take 1 tablet (100 mg total) by mouth at bedtime as needed for sleep. Please schedule office visit before any future refill. 07/18/22   Gwyneth Sprout, FNP     Allergies  Ivp dye [iodinated contrast media], Codeine, Erythromycin, Nsaids, and Ibuprofen   Family History   Family History  Problem Relation Age of Onset  . Heart disease Father        CABG x 4   . Hypertension Father   . Hyperlipidemia Father   . Heart disease Brother        CABG x 4   . Hyperlipidemia Brother   . Hypertension Brother   . Heart attack Brother   . Breast cancer Neg Hx       Physical Exam  Triage Vital Signs: ED Triage Vitals  Enc Vitals Group     BP      Pulse      Resp      Temp      Temp src      SpO2      Weight      Height      Head Circumference      Peak Flow      Pain Score      Pain Loc      Pain Edu?      Excl. in [place]?     Updated Vital Signs: BP 131/81   Pulse 69   Temp 98.2 F (36.8 C)   Resp 16   Wt 84.5 kg   SpO2 95%   BMI 31.98 kg/m    General: Awake, no distress.  Pleasant. CV:  RRR.  Good peripheral perfusion.  Resp:  Normal effort.  CTA B. Abd:  Nontender.  No distention.  Other:  Expressive aphasia.  Ambulatory with steady gait.  Gesturing with her hands.   ED Results / Procedures / Treatments  Labs (all labs ordered are listed, but only abnormal results are displayed) Labs Reviewed  ETHANOL - Abnormal; Notable for the following components:      Result Value   Alcohol, Ethyl (B) 33 (*)    All other components within normal limits  URINALYSIS, ROUTINE W REFLEX MICROSCOPIC - Abnormal; Notable for the following components:   Color, Urine COLORLESS (*)    APPearance CLEAR (*)    Hgb urine dipstick SMALL (*)    All other components within normal limits  COMPREHENSIVE METABOLIC PANEL - Abnormal; Notable for the following components:   Sodium 132 (*)    Potassium 3.4 (*)    Glucose, Bld 124 (*)    All other components within normal limits  LIPID PANEL - Abnormal; Notable for the following components:   Cholesterol 223 (*)    LDL Cholesterol 113 (*)    All other components within normal limits  BRAIN NATRIURETIC PEPTIDE - Abnormal; Notable for the following components:   B Natriuretic Peptide 327.3 (*)    All other components within normal limits  CBG MONITORING, ED - Abnormal; Notable for the following components:   Glucose-Capillary 102 (*)    All other components within normal limits  RESP PANEL BY RT-PCR (FLU A&B, COVID) ARPGX2  CBC  DIFFERENTIAL  URINE DRUG SCREEN, QUALITATIVE ([facility] ONLY)   PROTIME-INR  APTT  MAGNESIUM  PHOSPHORUS  HEMOGLOBIN A1C  HIV ANTIBODY (ROUTINE TESTING W REFLEX)  TROPONIN I (HIGH SENSITIVITY)  TROPONIN I (HIGH SENSITIVITY)     EKG  ED ECG REPORT I, Ronin Rehfeldt J, the attending physician, personally viewed and interpreted this ECG.   Date: 08/26/2022  EKG Time: 0302  Rate: 67  Rhythm: normal sinus rhythm  Axis: Normal  Intervals:none  ST&T Change: Nonspecific    RADIOLOGY I have independently visualized and interpreted patient's CT head chest x-ray as well as noted the radiology interpretation:  CT head: No ICH as discussed with Dr. Lennon Alstrom from radiology  Chest x-ray: Mild cardiomegaly, otherwise no acute process  CTA head neck perfusion study: No acute normality  Official radiology report(s): CT ANGIO HEAD NECK W WO CM W PERF (CODE STROKE)  Result Date: 08/26/2022 CLINICAL DATA:  Aphasia, vision changes EXAM: CT ANGIOGRAPHY HEAD AND NECK CT PERFUSION BRAIN TECHNIQUE: Multidetector CT imaging of the head and neck was performed using the standard protocol during bolus administration of intravenous contrast. Multiplanar CT image reconstructions and MIPs were obtained to evaluate the vascular anatomy. Carotid stenosis measurements (when applicable) are obtained utilizing NASCET criteria, using the distal internal carotid diameter as the denominator. Multiphase CT imaging of the brain was performed following IV bolus contrast injection. Subsequent parametric perfusion maps were calculated using RAPID software. RADIATION DOSE REDUCTION: This exam was performed according to the departmental dose-optimization program which includes automated exposure control, adjustment of the mA and/or kV according to patient size and/or use of iterative reconstruction technique. CONTRAST:  183m OMNIPAQUE IOHEXOL 350 MG/ML SOLN COMPARISON:  No prior CTA, correlation is made with CT head 08/26/2022 FINDINGS: CT HEAD FINDINGS For noncontrast findings, please see  same day CT head. ASPECTS ([place] Early CT Score): 10 CTA NECK FINDINGS Aortic arch: Standard branching. Imaged portion shows no evidence of aneurysm or dissection. No significant stenosis of the major arch vessel origins. Right carotid system: No evidence of dissection, occlusion, or hemodynamically significant stenosis (greater than 50%). Left carotid system: No evidence of dissection, occlusion, or hemodynamically significant stenosis (greater than 50%). Atherosclerotic disease at the bifurcation and in the proximal ICA is not hemodynamically significant. Vertebral arteries: Left dominant system. No evidence of dissection, occlusion, or hemodynamically significant stenosis (greater than 50%). Skeleton: Degenerative changes in the cervical spine. No acute osseous abnormality. Other neck: Small tracheal diverticulum.  Otherwise negative. Upper chest: Interlobular septal thickening, possibly mild pulmonary edema. Review of the MIP images confirms the above findings CTA HEAD FINDINGS Anterior circulation: Both internal carotid arteries are patent to the termini, without significant stenosis. A1 segments patent. Normal anterior communicating artery. Anterior cerebral arteries are patent to their distal aspects. No M1 stenosis or occlusion. MCA branches perfused and symmetric. Posterior circulation: Vertebral arteries patent to the vertebrobasilar junction without stenosis. Posterior inferior cerebellar arteries patent proximally. Basilar patent to its distal aspect. Superior cerebellar arteries patent proximally. Patent P1 segments. PCAs perfused to their distal aspects without stenosis. The left posterior communicating artery is patent. The right is not visualized. Venous sinuses: As permitted by contrast timing, patent. Anatomic variants: None significant. Review of the MIP images confirms the above findings CT Brain Perfusion Findings: ASPECTS: 10 CBF (<30%) Volume: 052mPerfusion (Tmax>6.0s) volume:  16m56mismatch Volume: 16mL64mfarction Location:None IMPRESSION: 1. No evidence of infarct core or penumbra on CT perfusion. 2.  No intracranial large vessel occlusion or significant stenosis. 3. No hemodynamically significant stenosis in the neck. 4. Interlobular septal thickening in the imaged lungs, possibly mild pulmonary edema. Electronically Signed   By: AlisMerilyn Baba.   On: 08/26/2022  03:41   DG Chest Port 1 View  Result Date: 08/26/2022 CLINICAL DATA:  Stroke EXAM: PORTABLE CHEST 1 VIEW COMPARISON:  07/29/2022 FINDINGS: Lungs are clear. No pneumothorax or pleural effusion. Cardiac size mildly enlarged. Pulmonary vascularity is normal. No acute bone abnormality. IMPRESSION: Mild cardiomegaly. Electronically Signed   By: Fidela Salisbury M.D.   On: 08/26/2022 02:59   CT HEAD CODE STROKE WO CONTRAST  Result Date: 08/26/2022 CLINICAL DATA:  Code stroke.  Aphasia EXAM: CT HEAD WITHOUT CONTRAST TECHNIQUE: Contiguous axial images were obtained from the base of the skull through the vertex without intravenous contrast. RADIATION DOSE REDUCTION: This exam was performed according to the departmental dose-optimization program which includes automated exposure control, adjustment of the mA and/or kV according to patient size and/or use of iterative reconstruction technique. COMPARISON:  None Available. FINDINGS: Brain: No evidence of acute infarction, hemorrhage, cerebral edema, mass, mass effect, or midline shift. No hydrocephalus or extra-axial collection. Vascular: No hyperdense vessel. Skull: Negative for fracture or focal lesion. Sinuses/Orbits: No acute finding. Other: The mastoid air cells are well aerated. ASPECTS [facility] Stroke Program Early CT Score) - Ganglionic level infarction (caudate, lentiform nuclei, internal capsule, insula, M1-M3 cortex): 7 - Supraganglionic infarction (M4-M6 cortex): 3 Total score (0-10 with 10 being normal): 10 IMPRESSION: 1. No acute intracranial process. 2. ASPECTS is 10  These findings were discussed by telephone on 08/26/2022 at 2:36 am with provider Lewi Drost . Electronically Signed   By: Merilyn Baba M.D.   On: 08/26/2022 02:37     PROCEDURES:  Critical Care performed: Yes, see critical care procedure note(s)  CRITICAL CARE Performed by: Paulette Blanch   Total critical care time: 60 minutes  Critical care time was exclusive of separately billable procedures and treating other patients.  Critical care was necessary to treat or prevent imminent or life-threatening deterioration.  Critical care was time spent personally by me on the following activities: development of treatment plan with patient and/or surrogate as well as nursing, discussions with consultants, evaluation of patient's response to treatment, examination of patient, obtaining history from patient or surrogate, ordering and performing treatments and interventions, ordering and review of laboratory studies, ordering and review of radiographic studies, pulse oximetry and re-evaluation of patient's condition.   Marland Kitchen1-3 Lead EKG Interpretation  Performed by: Paulette Blanch, MD Authorized by: Paulette Blanch, MD     Interpretation: normal     ECG rate:  67   ECG rate assessment: normal     Rhythm: sinus rhythm     Ectopy: none     Conduction: normal   Comments:     Patient placed on cardiac monitor to evaluate for arrhythmias  NIH Stroke Scale  Interval: Baseline Time: 2:40AM Person Administering Scale: Camira Geidel J  Administer stroke scale items in the order listed. Record performance in each category after each subscale exam. Do not go back and change scores. Follow directions provided for each exam technique. Scores should reflect what the patient does, not what the clinician thinks the patient can do. The clinician should record answers while administering the exam and work quickly. Except where indicated, the patient should not be coached (i.e., repeated requests to patient to make a special  effort).   1a  Level of consciousness: 0=alert; keenly responsive  1b. LOC questions:  2=Performs neither task correctly  1c. LOC commands: 0=Performs both tasks correctly  2.  Best Gaze: 0=normal  3.  Visual: 0=No visual loss  4. Facial Palsy: 1=Minor paralysis (  flattened nasolabial fold, asymmetric on smiling)  5a.  Motor left arm: 0=No drift, limb holds 90 (or 45) degrees for full 10 seconds  5b.  Motor right arm: 0=No drift, limb holds 90 (or 45) degrees for full 10 seconds  6a. motor left leg: 0=No drift, limb holds 90 (or 45) degrees for full 10 seconds  6b  Motor right leg:  0=No drift, limb holds 90 (or 45) degrees for full 10 seconds  7. Limb Ataxia: 0=Absent  8.  Sensory: 0=Normal; no sensory loss  9. Best Language:  3=Mute, global aphasia; no usable speech or auditory comprehension  10. Dysarthria: 1=Mild to moderate, patient slurs at least some words and at worst, can be understood with some difficulty  11. Extinction and Inattention: 0=No abnormality  12. Distal motor function: 0=Normal   Total:   7     MEDICATIONS ORDERED IN ED: Medications  nicardipine (CARDENE) 29m in 0.86% saline 2069mIV infusion (0.1 mg/ml) (5 mg/hr Intravenous Rate/Dose Change 08/26/22 0403)  diphenhydrAMINE (BENADRYL) 50 MG/ML injection (  Not Given 08/26/22 0354)  methylPREDNISolone sodium succinate (SOLU-MEDROL) 125 mg/2 mL injection (  Not Given 08/26/22 0353)  docusate sodium (COLACE) capsule 100 mg (has no administration in time range)  polyethylene glycol (MIRALAX / GLYCOLAX) packet 17 g (has no administration in time range)   stroke: early stages of recovery book (has no administration in time range)  acetaminophen (TYLENOL) tablet 650 mg (has no administration in time range)  pantoprazole (PROTONIX) EC tablet 40 mg (has no administration in time range)  labetalol (NORMODYNE) 5 MG/ML injection (10 mg  Given 08/26/22 0305)  labetalol (NORMODYNE) injection 10 mg (10 mg Intravenous Given 08/26/22  0256)  tenecteplase (TNKASE) injection for Stroke 21 mg (21 mg Intravenous Given 08/26/22 0307)  iohexol (OMNIPAQUE) 350 MG/ML injection 100 mL (100 mLs Intravenous Contrast Given 08/26/22 0317)  methylPREDNISolone sodium succinate (SOLU-MEDROL) 125 mg/2 mL injection 125 mg (125 mg Intravenous Given 08/26/22 0347)  EPINEPHrine (EPI-PEN) injection 0.3 mg (0.3 mg Intramuscular Given 08/26/22 0344)  diphenhydrAMINE (BENADRYL) injection 25 mg (25 mg Intravenous Given 08/26/22 0346)  famotidine (PEPCID) IVPB 20 mg premix (0 mg Intravenous Stopped 08/26/22 0420)  ipratropium-albuterol (DUONEB) 0.5-2.5 (3) MG/3ML nebulizer solution 3 mL (3 mLs Nebulization Given 08/26/22 0353)  sterile water (preservative free) injection (2 mLs  Given 08/26/22 0352)     IMPRESSION / MDM / ASSESSMENT AND PLAN / ED COURSE  I reviewed the triage vital signs and the nursing notes.                             [age]ear old female presenting with aphasia, time of onset approximately 30 minutes ago.  Differential diagnosis includes but is not limited to CVA, ICH, migraine, ACS, etc.  I have personally reviewed patient's records and note a cardiology telephone call on 07/30/2022 for appointment for chest pain.  Patient's presentation is most consistent with acute presentation with potential threat to life or bodily function.  The patient is on the cardiac monitor to evaluate for evidence of arrhythmia and/or significant heart rate changes.  I met this patient in triage and sent her urgently for CT head.  Code stroke initiated.  She will go to treatment room 3 where the teleneurology monitor is already placed.  Clinical Course as of 08/26/22 0548  Tue Aug 26, 2022  0243 Patient speaking with teleneurology nurse.  BP elevated 199/113.  Will start Cardene drip. [JS]  0252 Teleneurology ordered 10 mg IV Labetalol.  Will hold Cardene drip for now and defer to their expertise. [JS]  0300 Teleneurology has activated TNKase. [JS]  0319  Appreciate teleneurologist's input.  I have discussed case with her via telephone.  She has informed me of patient receiving 2 doses of 10 mg IV labetalol as well as starting TNKase.  Patient is currently in CT scan getting perfusion studies.  Neurologist will contact me in case of abnormality.  If perfusion scans are unremarkable, will admit patient to our facility to the ICU. [JS]  9611 Shortly after patient returned from CT scan, she complained of shortness of breath.  Wheezing noted on exam.  There is no hives or angioedema.  She states she has never received IV dye previously.  Will initiate IV allergic reaction cocktail to include EpiPen, IV Solu-Medrol/Benadryl/Pepcid.  Nicardipine drip at bedside which teleneurology agrees to start. [JS]  6435 No acute abnormality on CT perfusion study.  Will discuss with CCU for admission. [JS]  [id number] Patient resting comfortably after administration of IV allergic reaction cocktail.  Saturations on oxygen 95%. [JS]    Clinical Course User Index [JS] Paulette Blanch, MD     FINAL CLINICAL IMPRESSION(S) / ED DIAGNOSES   Final diagnoses:  Aphasia  Cerebrovascular accident (CVA), unspecified mechanism ([place])  Hypertensive urgency  Anaphylaxis, initial encounter     Rx / DC Orders   ED Discharge Orders     None        Note:  This document was prepared using Dragon voice recognition software and may include unintentional dictation errors.   Paulette Blanch, MD 08/26/22 [phone]

## 2022-08-26 NOTE — Progress Notes (Signed)
Patient reports 10/10 chest pain. MD Lanney Gins and NP Meda Coffee made aware verbally by this RN. 12 Lead captured by this RN and handed to NP [facility]. 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 Reassurance was offered by this RN to patient and consoled patient. NP Meda Coffee reviewing 12 lead and consulting cardiology. This RN will monitor closely for new orders and patient's vitals. Patient resting in bed at this time with boyfriend at bedside offering appropriate support.

## 2022-08-26 NOTE — Progress Notes (Signed)
PT Cancellation Note  Patient Details Name: Kathleen Perkins MRN: [id number] DOB: 1961-05-12   Cancelled Treatment:    Reason Eval/Treat Not Completed: Medical issues which prohibited therapy (PT consult received and chart reviewed.  Patient s/p administration of TNK (0308, 08/26/22) with "start tomorrow" orders (08/27/22).  Will continue to follow and initiate as medically appropriate.)   Porfiria Heinrich H. Owens Shark, PT, DPT, NCS 08/26/22, 10:09 AM [phone]

## 2022-08-26 NOTE — Progress Notes (Signed)
Traveled to MRI and back to ICU without incident. Patient provided with '1mg'$  Ativan prior to transportation to help ease anxiety.

## 2022-08-26 NOTE — ED Notes (Signed)
Patient reports chest tightness and wheezing. Oxygen saturation noted to decrease to 88% on RA. Placed on 2L per [place]. Dr. Beather Arbour arrived promptly to bedside.

## 2022-08-26 NOTE — Progress Notes (Signed)
Neurology same day note  61 yo woman with hx MVP, HL, palpitations who presented last night with aphasia and dysarthria and received TNK 2/2 c/f acute ischemic stroke. She has been in a fib since admission (new dx for her). She was seen by teleneuro around 0300 today and I examined her this afternoon to f/u. Exam notable for only mild dysarthria, no aphasia, no other deficits. Brain MRI shows 3 punctate ischemic strokes ranging in size from 3-6 mm in multiple vascular distributions (L precentral gyrus, L insula, R temporoparietal). Distribution c/w central embolic source. No new complaints today. She will be seen by cardiology today as well.   Interval data  1. No evidence of infarct core or penumbra on CT perfusion. 2.  No intracranial large vessel occlusion or significant stenosis. 3. No hemodynamically significant stenosis in the neck. 4. Interlobular septal thickening in the imaged lungs, possibly mild pulmonary edema.  Stroke Labs     Component Value Date/Time   CHOL 223 (H) 08/26/2022 0409   CHOL 268 (H) 11/08/2020 1423   TRIG 117 08/26/2022 0409   HDL 87 08/26/2022 0409   HDL 93 11/08/2020 1423   CHOLHDL 2.6 08/26/2022 0409   VLDL 23 08/26/2022 0409   LDLCALC 113 (H) 08/26/2022 0409   LDLCALC 145 (H) 11/08/2020 1423   LABVLDL 30 11/08/2020 1423    Lab Results  Component Value Date/Time   HGBA1C 5.6 08/26/2022 05:19 AM   TTE pending  Recommendations: - Continue neurochecks and NIHSS documentation per post-tNK protocol - STAT head CT for any change in neurologic exam - Non-con head CT 24 hrs post-tNK r/o hemorrhagic conversion (ordered for 9/27 @ 0300) - F/u TTE - no aspirin for 24 hours post IV t-PA and until ICH ruled out by a head CT - keep SBP less than 180/105 for the 1st 24 hours post IV t-NK to reduce the risk of hemorrhagic transformation - SCDs for DVT prophylaxis; can start SQ Heparin if head CT 24 hours post IV tPA is negative for ICH - NPO untl swallow study  passed and documented - PT/OT and speech therapy - stroke education - outpatient f/u with neurology after discharge (I will arrange) - If head CT at 24 hrs post-TNK does not show any hemorrhage, will plan to start patient on eliquis for secondary stroke prevention in the setting of a fib tomorrow. Please do not order the eliquis until I have reviewed the CT images in AM.  Will continue to follow.  Su Monks, MD [facility] [phone]  If 7pm- 7am, please page neurology on call as listed in [place].

## 2022-08-26 NOTE — Consult Note (Signed)
Cardiology Consultation   Patient ID: ZAMANI CROCKER MRN: [id number]; DOB: 1960/12/05  Admit date: 08/26/2022 Date of Consult: 08/26/2022  PCP:  Virginia Crews, MD   [facility] Providers Cardiologist:  Mertie Moores, MD        Patient Profile:   Kathleen Perkins is a 61 y.o. female with a hx of anxiety, mitral valve prolapse, hyperlipidemia, and palpitations, who is being seen 08/26/2022 for the evaluation of new onset atrial fibrillation at the request of Rodman Comp, NP.  History of Present Illness:   Kathleen Perkins is a 61 year old female with past medical history of anxiety, mitral valve prolapse, hyperlipidemia, and palpitations who who states she has a high intensity personality.  She continues to follow for yearly appointments with Dr. Acie Fredrickson for palpitations and MVP.  She reports having longstanding history of palpitations and anxiety at baseline and states she is she takes her medication regularly without missing doses.  She denies to any tobacco but states that previously she did smoke marijuana.  She does admit to drinking beer during the week.  She presented to the [facility] emergency department on 07/29/2022 with a complaint of chest pain that radiated into her left arm earlier in the day and resolving spontaneously after few hours after taking 324 mg of aspirin.  She had stated that she had chest pain similar to this in the past however it never been quite as acute.  She described the pain as a 9 out of 10 crushing pressure-like pain in the center of her chest that did not worsen or improve with exertion.  She also had associated shortness of breath.  Blood pressure was elevated to 194/90, high-sensitivity troponin of 6, EKG without any ischemic changes noted.  She was subsequently discharged with ambulatory referral for outpatient cardiology.  She presented from home to the [facility] emergency department for the evaluation anarthria and right facial droop.  The patient and her  significant other remains at the bedside reported being in her normal state of health until 150-2 AM on 08/26/2022.  She works as a Marine scientist at [employer] and got home around 1 AM.  According to her chart she had had a beer with her husband is getting ready for bed.  She noticed that when she was lying in bed looking at her phone that she had an out of body experience or an aura and just felt odd.  She tried to holler for her significant other and was last at that point in time that she had no speech.  She was still able to move all of her extremities and think clearly.  She then took two 325 mg aspirin took her propanolol and her as needed atenolol.  She then got up and walked into the other room where significant other was and according to him she appeared to be trying to scream but there was no sound that was coming out.  Instead of calling 911 she insisted he drive her to the emergency department for evaluation.  When she arrived in the emergency department code stroke was initiated and teleneurology evaluated the patient.  Blood pressure was elevated and demonstrated hypertensive urgency for which she was treated.  Initial NIHSS was 7.  She was considered a candidate for TNK which was administered at 3: 07.  Imaging was unremarkable, negative for bleeding/ischemia and CTA was negative for any LVO or significant stenosis.  Unfortunately after IV contrast the patient had an anaphylactic reaction requiring a nonrebreather mask,  epinephrine, Benadryl, Pepcid, Solu-Medrol, and DuoNebs.  The patient recovered without any additional intervention.  After event PCCM was consulted for admission and she was subsequently admitted to ICU with suspected acute ischemic stroke status post TNK.  She does endorse having a chest tightness and pressure midsternal without radiation earlier in the day and previously that she states does not increase with exertion or with rest, endorses associated shortness of breath with the discomfort,  chronic palpitations, and increased anxiety.  Initial vitals: Blood pressure 206/110, pulse of 66, respirations of 15, temperature of 98.2,  Pertinent labs: Sodium 132, potassium 3.4, BUN 16, serum creatinine 0.92, glucose 124, WBCs 9.7, hemoglobin 13.4, high-sensitivity troponin 7 and 15, respiratory panel negative for COVID, BNP 327.3, magnesium 2.1, phosphorus 4.4,  Imaging: Initial CT of the head for code stroke revealed no acute intracranial process, chest x-ray revealed mild cardiomegaly  Past Medical History:  Diagnosis Date   Anxiety    Melanoma of upper arm (HCC)    MVP (mitral valve prolapse)    Primary osteoarthritis of left knee 08/15/2014    Past Surgical History:  Procedure Laterality Date   ARTHROSCOPIC REPAIR ACL     BREAST BIOPSY Left 06/23/2013   u/s bx- neg   BREAST EXCISIONAL BIOPSY Left 07/14/2013   us/bx-neg   MELANOMA EXCISION     left arm.    REPLACEMENT TOTAL KNEE     left      Home Medications:  Prior to Admission medications   Medication Sig Start Date End Date Taking? Authorizing Provider  atenolol (TENORMIN) 25 MG tablet Take 1 tablet (25 mg total) by mouth 2 (two) times daily as needed. 06/20/22  Yes Gwyneth Sprout, FNP  clonazePAM (KLONOPIN) 0.5 MG tablet Take 0.5 tablets (0.25 mg total) by mouth 2 (two) times daily as needed for anxiety. Due for in office follow up. 06/13/22  Yes Gwyneth Sprout, FNP  hydrochlorothiazide (HYDRODIURIL) 12.5 MG tablet Take 1 tablet (12.5 mg total) by mouth daily. 01/29/21  Yes Bacigalupo, Dionne Bucy, MD  hydrOXYzine (ATARAX) 10 MG tablet TAKE ONE TABLET BY MOUTH THREE TIMES A DAY AS NEEDED 04/29/22  Yes Bacigalupo, Dionne Bucy, MD  lamoTRIgine (LAMICTAL) 100 MG tablet TAKE ONE TABLET BY MOUTH TWICE A DAY 04/29/22  Yes Bacigalupo, Dionne Bucy, MD  propranolol (INDERAL) 10 MG tablet Take 1 tablet (10 mg total) by mouth 4 (four) times daily as needed. CALL AND SCHEDULE OVERDUE APPOINTMENT TO RECEIVE FURTHER REFILLS. [phone]. 1ST  ATTEMPT. 06/30/22  Yes Nahser, Wonda Cheng, MD  traZODone (DESYREL) 100 MG tablet Take 1 tablet (100 mg total) by mouth at bedtime as needed for sleep. Please schedule office visit before any future refill. 07/18/22  Yes Gwyneth Sprout, FNP  amoxicillin (AMOXIL) 500 MG capsule Take by mouth. Patient not taking: Reported on 08/26/2022 08/25/22   [provider]  lamoTRIgine (LAMICTAL) 150 MG tablet Take 1 tablet (150 mg total) by mouth 2 (two) times daily. Patient not taking: Reported on 08/26/2022 10/07/21   Virginia Crews, MD  ondansetron (ZOFRAN) 4 MG tablet Take 1 tablet (4 mg total) by mouth every 8 (eight) hours as needed for nausea or vomiting. Patient not taking: Reported on 08/26/2022 01/29/21   Virginia Crews, MD    Inpatient Medications: Scheduled Meds:  [START ON 08/27/2022]  stroke: early stages of recovery book   Does not apply Once   atorvastatin  40 mg Oral Daily   diphenhydrAMINE  folic acid  1 mg Oral Daily   lamoTRIgine  100 mg Oral BID   LORazepam  1 mg Intravenous Once   methylPREDNISolone sodium succinate       metoprolol tartrate  12.5 mg Oral Q6H   multivitamin with minerals  1 tablet Oral Daily   pantoprazole  40 mg Oral Daily   thiamine  100 mg Oral Daily   Continuous Infusions:  niCARDipine Stopped (08/26/22 0603)   PRN Meds: acetaminophen, clonazePAM, diphenhydrAMINE, docusate sodium, hydrOXYzine, methylPREDNISolone sodium succinate, mouth rinse, polyethylene glycol, propranolol  Allergies:    Allergies  Allergen Reactions   Ivp Dye [Iodinated Contrast Media] Shortness Of Breath    Patient had wheezing and hypoxia after returning from IV dye CT   Codeine Nausea Only   Erythromycin Nausea Only   Nsaids    Ibuprofen Nausea Only    Social History:   Social History   Socioeconomic History   Marital status: Divorced    Spouse name: Not on file   Number of children: Not on file   Years of education: Not on file   Highest education  level: Not on file  Occupational History   Not on file  Tobacco Use   Smoking status: Never   Smokeless tobacco: Never  Substance and Sexual Activity   Alcohol use: No   Drug use: No   Sexual activity: Not on file  Other Topics Concern   Not on file  Social History Narrative   Not on file   Social Determinants of Health   Financial Resource Strain: Not on file  Food Insecurity: Not on file  Transportation Needs: Not on file  Physical Activity: Not on file  Stress: Not on file  Social Connections: Not on file  Intimate Partner Violence: Not on file    Family History:    Family History  Problem Relation Age of Onset   Heart disease Father        CABG x 4    Hypertension Father    Hyperlipidemia Father    Heart disease Brother        CABG x 4    Hyperlipidemia Brother    Hypertension Brother    Heart attack Brother    Breast cancer Neg Hx      ROS:  Please see the history of present illness.  Review of Systems  Respiratory:  Positive for shortness of breath.   Cardiovascular:  Positive for chest pain and palpitations.  Neurological:  Positive for speech change.  Psychiatric/Behavioral:  The patient is nervous/anxious.     All other ROS reviewed and negative.     Physical Exam/Data:   Vitals:   08/26/22 1000 08/26/22 1100 08/26/22 1200 08/26/22 1300  BP: (!) 164/90 (!) 141/128 (!) 127/113 (!) 138/108  Pulse: (!) 35 71 (!) 143 65  Resp: (!) '23 16 19 14  '$ Temp:    98.6 F (37 C)  TempSrc:    Oral  SpO2: 95% 97% 100% 99%  Weight:       No intake or output data in the 24 hours ending 08/26/22 1342    08/26/2022    2:55 AM 07/29/2022    2:52 PM 11/07/2021    1:22 PM  Last 3 Weights  Weight (lbs) 186 lb 4.6 oz 189 lb 6 oz 189 lb 6.4 oz  Weight (kg) 84.5 kg 85.9 kg 85.911 kg     Body mass index is 31.98 kg/m.  General:  Well nourished, well developed, in  no acute distress HEENT: normal Neck: no JVD Vascular: No carotid bruits; Distal pulses 2+  bilaterally Cardiac:  normal S1, S2; RRR; no murmur  Lungs:  clear upper lobes diminished bases with fine crackles to auscultation bilaterally, respirations are unlabored at rest on room air Abd: soft, nontender, no hepatomegaly, bs + x all 4 quadrants Ext: no edema Musculoskeletal:  No deformities, BUE and BLE strength normal and equal Skin: warm and dry  Neuro:  CNs 2-12 intact, no focal abnormalities noted Psych:  Normal affect   EKG:  The EKG was personally reviewed and demonstrates: Sinus rhythm rate of 67 with LVH and left atrial enlargement; repeat revealed atrial fibrillation with RVR Telemetry:  Telemetry was personally reviewed and demonstrates: Atrial fibrillation with RVR rates of 130s to 150s  Relevant CV Studies: Echocardiogram ordered and pending  Laboratory Data:  High Sensitivity Troponin:   Recent Labs  Lab 07/29/22 1220 08/26/22 0236 08/26/22 0409  TROPONINIHS '6 7 15     '$ Chemistry Recent Labs  Lab 08/26/22 0409  NA 132*  K 3.4*  CL 98  CO2 24  GLUCOSE 124*  BUN 16  CREATININE 0.92  CALCIUM 8.9  MG 2.1  GFRNONAA >60  ANIONGAP 10    Recent Labs  Lab 08/26/22 0409  PROT 7.8  ALBUMIN 4.5  AST 23  ALT 19  ALKPHOS 77  BILITOT 0.7   Lipids  Recent Labs  Lab 08/26/22 0409  CHOL 223*  TRIG 117  HDL 87  LDLCALC 113*  CHOLHDL 2.6    Hematology Recent Labs  Lab 08/26/22 0236  WBC 9.7  RBC 4.43  HGB 13.4  HCT 40.8  MCV 92.1  MCH 30.2  MCHC 32.8  RDW 13.2  PLT 381   Thyroid No results for input(s): "TSH", "FREET4" in the last 168 hours.  BNP Recent Labs  Lab 08/26/22 0236  BNP 327.3*    DDimer No results for input(s): "DDIMER" in the last 168 hours.   Radiology/Studies:  CT ANGIO HEAD NECK W WO CM W PERF (CODE STROKE)  Result Date: 08/26/2022 CLINICAL DATA:  Aphasia, vision changes EXAM: CT ANGIOGRAPHY HEAD AND NECK CT PERFUSION BRAIN TECHNIQUE: Multidetector CT imaging of the head and neck was performed using the standard  protocol during bolus administration of intravenous contrast. Multiplanar CT image reconstructions and MIPs were obtained to evaluate the vascular anatomy. Carotid stenosis measurements (when applicable) are obtained utilizing NASCET criteria, using the distal internal carotid diameter as the denominator. Multiphase CT imaging of the brain was performed following IV bolus contrast injection. Subsequent parametric perfusion maps were calculated using RAPID software. RADIATION DOSE REDUCTION: This exam was performed according to the departmental dose-optimization program which includes automated exposure control, adjustment of the mA and/or kV according to patient size and/or use of iterative reconstruction technique. CONTRAST:  130m OMNIPAQUE IOHEXOL 350 MG/ML SOLN COMPARISON:  No prior CTA, correlation is made with CT head 08/26/2022 FINDINGS: CT HEAD FINDINGS For noncontrast findings, please see same day CT head. ASPECTS ([place] Early CT Score): 10 CTA NECK FINDINGS Aortic arch: Standard branching. Imaged portion shows no evidence of aneurysm or dissection. No significant stenosis of the major arch vessel origins. Right carotid system: No evidence of dissection, occlusion, or hemodynamically significant stenosis (greater than 50%). Left carotid system: No evidence of dissection, occlusion, or hemodynamically significant stenosis (greater than 50%). Atherosclerotic disease at the bifurcation and in the proximal ICA is not hemodynamically significant. Vertebral arteries: Left dominant system. No evidence  of dissection, occlusion, or hemodynamically significant stenosis (greater than 50%). Skeleton: Degenerative changes in the cervical spine. No acute osseous abnormality. Other neck: Small tracheal diverticulum.  Otherwise negative. Upper chest: Interlobular septal thickening, possibly mild pulmonary edema. Review of the MIP images confirms the above findings CTA HEAD FINDINGS Anterior circulation:  Both internal carotid arteries are patent to the termini, without significant stenosis. A1 segments patent. Normal anterior communicating artery. Anterior cerebral arteries are patent to their distal aspects. No M1 stenosis or occlusion. MCA branches perfused and symmetric. Posterior circulation: Vertebral arteries patent to the vertebrobasilar junction without stenosis. Posterior inferior cerebellar arteries patent proximally. Basilar patent to its distal aspect. Superior cerebellar arteries patent proximally. Patent P1 segments. PCAs perfused to their distal aspects without stenosis. The left posterior communicating artery is patent. The right is not visualized. Venous sinuses: As permitted by contrast timing, patent. Anatomic variants: None significant. Review of the MIP images confirms the above findings CT Brain Perfusion Findings: ASPECTS: 10 CBF (<30%) Volume: 62m Perfusion (Tmax>6.0s) volume: 061mMismatch Volume: 75m32mnfarction Location:None IMPRESSION: 1. No evidence of infarct core or penumbra on CT perfusion. 2.  No intracranial large vessel occlusion or significant stenosis. 3. No hemodynamically significant stenosis in the neck. 4. Interlobular septal thickening in the imaged lungs, possibly mild pulmonary edema. Electronically Signed   By: AliMerilyn BabaD.   On: 08/26/2022 03:41   DG Chest Port 1 View  Result Date: 08/26/2022 CLINICAL DATA:  Stroke EXAM: PORTABLE CHEST 1 VIEW COMPARISON:  07/29/2022 FINDINGS: Lungs are clear. No pneumothorax or pleural effusion. Cardiac size mildly enlarged. Pulmonary vascularity is normal. No acute bone abnormality. IMPRESSION: Mild cardiomegaly. Electronically Signed   By: AshFidela SalisburyD.   On: 08/26/2022 02:59   CT HEAD CODE STROKE WO CONTRAST  Result Date: 08/26/2022 CLINICAL DATA:  Code stroke.  Aphasia EXAM: CT HEAD WITHOUT CONTRAST TECHNIQUE: Contiguous axial images were obtained from the base of the skull through the vertex without intravenous  contrast. RADIATION DOSE REDUCTION: This exam was performed according to the departmental dose-optimization program which includes automated exposure control, adjustment of the mA and/or kV according to patient size and/or use of iterative reconstruction technique. COMPARISON:  None Available. FINDINGS: Brain: No evidence of acute infarction, hemorrhage, cerebral edema, mass, mass effect, or midline shift. No hydrocephalus or extra-axial collection. Vascular: No hyperdense vessel. Skull: Negative for fracture or focal lesion. Sinuses/Orbits: No acute finding. Other: The mastoid air cells are well aerated. ASPECTS ([facility] Early CT Score) - Ganglionic level infarction (caudate, lentiform nuclei, internal capsule, insula, M1-M3 cortex): 7 - Supraganglionic infarction (M4-M6 cortex): 3 Total score (0-10 with 10 being normal): 10 IMPRESSION: 1. No acute intracranial process. 2. ASPECTS is 10 These findings were discussed by telephone on 08/26/2022 at 2:36 am with provider JADE SUNG . Electronically Signed   By: AliMerilyn BabaD.   On: 08/26/2022 02:37     Assessment and Plan:   Suspected ischemic stroke -TNK administered at 3:07 -Continue with neurochecks and NIHSS every shift -Neurology to follow -Bedrest for 24 hours -No arterial sticks for 24 hours -Management per CCM and neurology  New onset atrial fibrillation RVR -EKG revealed atrial fibrillation RVR rates 130s -Started on metoprolol 12.5 mg every 6 hours -Echocardiogram ordered and pending -CHA2DS2-VASc score of at least 4 -Just received TNK for stroke will have to wait until neurology clearance for starting anticoagulant whether be heparin or DOAC,  -patient has been advised  she will be on  long-term anticoagulant therapy -Continue cardiac monitor -TSH is pending  Hypertensive urgency-improving -Blood pressure 138/108 -We will allow for permissive hypertension for 24 hours status post CVA -Started on metoprolol 12.5 mg  every 6 hours -Vitals per unit protocol  Hyperlipidemia -LDL 113 -Continue statin therapy -Monitor LFTs   Risk Assessment/Risk Scores:          CHA2DS2-VASc Score = 4   This indicates a 4.8% annual risk of stroke. The patient's score is based upon: CHF History: 0 HTN History: 1 Diabetes History: 0 Stroke History: 2 Vascular Disease History: 0 Age Score: 0 Gender Score: 1         For questions or updates, please contact [place] Please consult [email or web address] for contact info under    Signed, Virda Betters, NP  08/26/2022 1:42 PM

## 2022-08-26 NOTE — Progress Notes (Signed)
0228 activated, patient in scan 0233 back (NCCT only), asked to order advanced imaging for aphasia,requested prepull 0237 paged neuro to see patient before going back down for advanced 0244 [facility] on screen 0256 BP high 162/116, labetalol given 0301 tnk order in BP still high another labetalol in 0307 timeout and bolus give 0316 down for advanced 0335 back and BP 169/131, requested recycle Patient wheezing and sats dropping, EDP at bedside, breathing tx and epi being ordered 0344 requested EDP start cardene as soon as epi and breathing treatment have been handled since BP is high 0345 Cardene started. 0400 Advanced imaging relayed to [facility] 0403 asked bedside to increase cardene since diastolic remain above 161. Discussed acceptable post tnk parameters before getting off screen.

## 2022-08-26 NOTE — Evaluation (Signed)
Speech Language Pathology Evaluation Patient Details Name: Kathleen Perkins MRN: [id number] DOB: 05-Sep-1961 Today's Date: 08/26/2022 Time: 3329-5188 SLP Time Calculation (min) (ACUTE ONLY): 49 min  Problem List:  Patient Active Problem List   Diagnosis Date Noted   Ischemic cerebrovascular accident (CVA) ([place]) 08/26/2022   Class 1 obesity without serious comorbidity with body mass index (BMI) of 32.0 to 32.9 in adult 11/07/2021   Hyperlipidemia 11/12/2018   B12 deficiency 11/12/2018   Avitaminosis D 11/12/2018   Chronic cervical pain 02/25/2017   History of melanoma 02/25/2017   Mitral valve prolapse 02/25/2017   Bipolar affective ([place]) 11/26/2016   Anxiety and depression 06/01/2015   Arrhythmia 06/01/2015   Past Medical History:  Past Medical History:  Diagnosis Date   Anxiety    Melanoma of upper arm (HCC)    MVP (mitral valve prolapse)    Primary osteoarthritis of left knee 08/15/2014   Past Surgical History:  Past Surgical History:  Procedure Laterality Date   ARTHROSCOPIC REPAIR ACL     BREAST BIOPSY Left 06/23/2013   u/s bx- neg   BREAST EXCISIONAL BIOPSY Left 07/14/2013   us/bx-neg   MELANOMA EXCISION     left arm.    REPLACEMENT TOTAL KNEE     left    HPI:  Kathleen Perkins is a 61 y.o. female who presents to the ED accompanied by her husband with a chief complaint of possible stroke.  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  She had a beer and chatted with her husband.  They had finished talking approximately 20 minutes before 2 AM when she got ready for bed and husband heard her scream.  CT Angio was No evidence of infarct core or penumbra on CT perfusion. 2.  No intracranial large vessel occlusion or significant stenosis. 3. No hemodynamically significant stenosis in the neck. 4. Interlobular septal thickening in the imaged lungs, possibly mild pulmonary edema.   Assessment / Plan / Recommendation Clinical Impression  Pt presents with mild  fluent aphasia c/b by mild word finding deficits and phonemic paraphasias. Pt's speech intelligibility is reduced at the sentence level to ~ 90% and is ~ 75% at the complex conversation level d/t phonemic substituations and fast rate of speech. Pt's speech intelligibility was also likely reduced d/t high level of anziety regarding current work situation and attempts at responding to text message. As such, her blood pressure also rose to 164/90 likely increased d/t anxiety. The bedside version of the Western Aphasia Battery with pt obtaining score of 97 out of 100.   Pt is also unaware of her errors.    As mentioned above, pt with increased anxiety regarding curent situation. SLP facilitated session by aiding pt in calling her recruiter and providing information (with pt's permission) using pt's cellphone on speakerphone, information provided regarding current aphasia, recommendation for outpatient ST services. In addition, this therapist's contact information was provided with instructions on how to request order for Outpatient ST services.    SLP Assessment  SLP Recommendation/Assessment: Patient needs continued Speech [facility] SLP Visit Diagnosis: Aphasia (R47.01)    Recommendations for follow up therapy are one component of a multi-disciplinary discharge planning process, led by the attending physician.  Recommendations may be updated based on patient status, additional functional criteria and insurance authorization.    Follow Up Recommendations  Outpatient SLP    Assistance Recommended at Discharge  None  Functional Status Assessment Patient has had a  recent decline in their functional status and demonstrates the ability to make significant improvements in function in a reasonable and predictable amount of time.  Frequency and Duration min 2x/week  2 weeks      SLP Evaluation Cognition  Overall Cognitive Status: Within Functional Limits for tasks  assessed Arousal/Alertness: Awake/alert Orientation Level: Oriented X4 Awareness: Impaired Awareness Impairment: Intellectual impairment;Emergent impairment;Anticipatory impairment       Comprehension  Auditory Comprehension Overall Auditory Comprehension: Appears within functional limits for tasks assessed Visual Recognition/Discrimination Discrimination: Within Function Limits Reading Comprehension Reading Status: Within funtional limits    Expression Expression Primary Mode of Expression: Verbal Verbal Expression Overall Verbal Expression: Impaired Initiation: No impairment Automatic Speech: Name;Social Response Level of Generative/Spontaneous Verbalization: Conversation Repetition: No impairment Naming: No impairment Pragmatics: Impairment Impairments:  (very anxious) Effective Techniques: Articulatory cues;Open ended questions Non-Verbal Means of Communication: Not applicable Written Expression Dominant Hand: Right Written Expression: Not tested   Oral / Motor  Oral Motor/Sensory Function Overall Oral Motor/Sensory Function: Within functional limits Motor Speech Overall Motor Speech: Impaired Respiration: Within functional limits Phonation: Normal Resonance: Within functional limits Articulation: Impaired Level of Impairment: Phrase Intelligibility: Intelligibility reduced Word: 75-100% accurate Phrase: 75-100% accurate Sentence: 75-100% accurate Conversation: 50-74% accurate Motor Planning: Witnin functional limits           Kateena Degroote B. Rutherford Nail, M.S., CCC-SLP, Mining engineer Certified Brain Injury [place]  [place] Office [phone] Ascom [phone] Fax [phone]

## 2022-08-26 NOTE — ED Notes (Signed)
CODE STROKE CALLED TO CAREL[facility] (TAMMY)

## 2022-08-26 NOTE — ED Triage Notes (Signed)
Pt to ED with husband from home, pt presents with aphasia, per husband pts symptoms started around 0200 this morning, pt came home from working and was at baseline and then began to have trouble speaking, vision changes on right side and right arm weakness. Pt states she does take blood thinners, unable to say which one. Pt ambulatory to triage.

## 2022-08-26 NOTE — H&P (Signed)
NAME:  Kathleen Perkins, MRN:  [id number], DOB:  12-08-1960, LOS: 0 ADMISSION DATE:  08/26/2022, CONSULTATION DATE:  08/26/22 REFERRING MD:  Dr. Beather Arbour, CHIEF COMPLAINT: CODE STROKE  History of Present Illness:  61 yo F presenting to [facility] ED from home for evaluation of anarthria and RIGHT facial droop.  The patient reports being in her normal state of health until 01:50-2:00 am on 08/26/22. She works as a Marine scientist at [employer] and got home around 01:00 am. She had a beer with her husband and was getting ready for bed. She noticed that when she was looking at her phone there was an "aura" or something looked "odd". She tried to speak and couldn't. She then took 325 mg of ASA and noticed the her right side of her mouth was numb and drooping. She came right to the ED for evaluation. She reports having palpitations and anxiety at baseline and takes her medication regularly. She denies any tobacco or illicit drug use, she does admit to drinking about 12 beers a week. She denies headache, syncope, weakness, dizziness, CP, SOB or any sick symptoms. Of note she was recently in the ED with nonspecific chest pain with associated SOB. Work up was essentially benign and the patient was scheduled for an upcoming appointment. She was significantly hypertensive at the ED visit.  ED course: Upon arrival CODE STROKE initiated and Tele-neurology evaluated the patient. Vitals demonstrated hypertensive urgency for which she was treated. Initial NIHSS 7. She was considered a candidate for TNKase which was administered at 03:07. Imaging was unremarkable, negative for bleeding/ischemia and CTa was negative for an LVO or any significant stenosis. After IV contrast the patient had an anaphylactic reaction requiring NRB, epi, benadryl, pepcid, solumedrol & duo nebs. Patient recovered without any additional intervention. Lab work significant for mild hyponatremia & hypokalemia, with elevated LDL & total cholesterol. Medications given:  TNKase, solumedrol, IV contrast, epi pen, duo neb, pepcid, labetalol, benadryl, nicardipine drip initiated Initial Vitals: 98.2, 15, 66, 206/110 & 98% on RA Significant labs: (Labs/ Imaging personally reviewed) I, Domingo Pulse [place], AGACNP-BC, personally viewed and interpreted this ECG. EKG Interpretation: Date: 08/26/22, EKG Time: 03:02, Rate: 67, Rhythm: NSR, QRS Axis:  borderline RAD, Intervals: LAE, ST/T Wave abnormalities: non specific T wave abnormalities, Narrative Interpretation: NSR with LAE and non specific T wave abnormalities Chemistry: Na+: 132, K+: 3.4, BUN/Cr.: 16/0.92, Serum CO2/ AG: 24/10, glucose: 124 Hematology: WBC: 9.7, Hgb: 13.4,  Troponin: 7 > 15, BNP: pending, COVID-19 & Influenza A/B: negative  CXR 08/26/22: mild cardiomegaly CT head wo contrast 08/26/22: no acute intracranial process. ASPECTS 10 CT angio head/ neck w/ perfusion 08/26/22: no evidence of infarct core or penumbra on CT perfusion. No intracranial LVO or significant stenosis. No hemodynamically significant stenosis in the neck. Interlobular septal thickening in the imaged lungs, possibly mild pulmonary edema  PCCM consulted for admission due to suspected acute ischemic stroke status post TNKase administration.  Pertinent  Medical History  Anxiety Bipolar Disorder MVP Osteoarthritis on LEFT knee Melanoma of upper arm  Significant Hospital Events: Including procedures, antibiotic start and stop dates in addition to other pertinent events   08/26/22: Admit to ICU with suspected acute ischemic stroke status post TNKase administration.  Interim History / Subjective:  Patient awake and alert, able to speak with mild dysarthria and aphasia  Objective   Blood pressure (!) 156/115, pulse 70, temperature 98.2 F (36.8 C), temperature source Oral, resp. rate 18, weight 84.5 kg, SpO2 91 %.  No intake or output data in the 24 hours ending 08/26/22 0417 Filed Weights   08/26/22 0255  Weight: 84.5  kg    Examination: General: Adult female, critically ill, lying in bed, NAD HEENT: MM pink/moist, anicteric, atraumatic, neck supple Neuro: A&O x 4, able to follow commands, PERRL +3, MAE  Neurological Exam Level of arousal and orientation to time, place, and person were intact. Language including expression, naming, repetition, comprehension was assessed and found to have mild dysarthria & aphasia. Attention span and concentration were normal Recent and remote memory were intact Fund of Knowledge was assessed and was intact CN: II - Visual field intact OU III, IV, VI - Extraocular movements intact. V - Facial sensation intact bilaterally. VII - Facial movement intact bilaterally VIII - Hearing & vestibular intact bilaterally X - Palate elevates symmetrically XI - Chin turning & shoulder shrug intact bilaterally. XII - Tongue protrusion intact Motor Strength - strength 5/5 all extremities and pronator drift was absent.  Sensory - Light touch was assessed and was symmetrical Coordination - The patient had normal movements in the hands and feet with no ataxia or dysmetria.  Tremor was absent Gait and Station - deferred.  NIHSS Level of  Consciousness: 0 = Alert, keenly responsive; 1 = Not alert, but arousable by minor stimulation to obey, answer, or respond; 2 = Not alert, requires repeated stimulation to attend, or is obtunded and requires strong or painful stimulation to make movements (not stereotyped); 3 = Responds only with reflex motor or autonomic effects or totally unresponsive, flaccid, and areflexic   0  LOC Questions 0 = Answers both questions correctly; 1 = Answers one question correctly; 2 = Answers neither question correctly.  0  LOC Commands 0 = Performs both tasks correctly; 1 = Performs one task correctly; 2 = Performs neither task correctly  0  Best Gaze  0 = Normal; 1 = Partial gaze palsy; gaze is abnormal in one or both eyes, but forced deviation or total gaze  paresis is not present; 2 = Forced deviation, or total gaze paresis not overcome by the oculocephalic maneuver   0  Visual 0 = No visual loss; 1 = Partial hemianopia; 2 = Complete hemianopia; 3 = Bilateral hemianopia (blind including cortical blindness).   0  Facial Palsy 0 = Normal symmetrical movements; 1 = Minor paralysis (flattened nasolabial fold, asymmetry on smiling); 2 = Partial paralysis (total or near-total paralysis of lower face); 3 = Complete paralysis of one or both sides (absence of facial movement in the upper and lower face).  0  Motor Arm LEFT 0 = No drift; limb holds 90 (or 45) degrees for full 10 secs; 1 = Drift; limb holds 90 (or 45) degrees, but drifts down before full 10 seconds; does not hit bed or other support; 2 = Some effort against gravity; limb cannot get to or maintain (if cued) 90 (or 45) degrees, drifts down to bed, but has some effort against gravity; 3 = No effort against gravity; limb falls; 4 = No movement; UN = unable to test (Amputation or joint fusion).   0  Motor Arm RIGHT 0 = No drift; limb holds 90 (or 45) degrees for full 10 secs; 1 = Drift; limb holds 90 (or 45) degrees, but drifts down before full 10 seconds; does not hit bed or other support; 2 = Some effort against gravity; limb cannot get to or maintain (if cued) 90 (or 45) degrees, drifts down to bed, but has  some effort against gravity; 3 = No effort against gravity; limb falls; 4 = No movement; UN = unable to test (Amputation or joint fusion).   0  Motor Leg LEFT 0 = No drift; leg holds 30 degree position for full 5 secs; 1 = Drift; leg falls by the end of the 5-sec period but does not hit bed; 2 = Some effort against gravity; leg falls to bed by 5 secs, but has some effort against gravity; 3 = No effort against gravity; leg falls to bed immediately; 4 = No movement; UN = unable to test (Amputation or joint fusion).   0  Motor Leg RIGHT 0 = No drift; leg holds 30 degree position for full 5 secs; 1 = Drift;  leg falls by the end of the 5-sec period but does not hit bed; 2 = Some effort against gravity; leg falls to bed by 5 secs, but has some effort against gravity; 3 = No effort against gravity; leg falls to bed immediately; 4 = No movement; UN = unable to test (Amputation or joint fusion).   0  Limb Ataxia 0 = Absent; 1 = Present in one limb; 2 = Present in two limbs; UN = Amputation or joint fusion   0  Sensory  0 = Normal, no sensory loss; 1 = Mild-to-moderate sensory loss, patient feels pinprick is less sharp or is dull on the affected side, or there is a loss of superficial pain with pinprick, but patient is aware of being touched; 2 = Severe to total sensory loss, patient is not aware of being touched in the face, arm, and leg.   0  Best Language 0 = No aphasia; normal; 1 = Mild-to-moderate aphasia; some obvious loss of fluency or facility of comprehension, w/o significant limitation on ideas expressed or form of expression. Reduction of speech and/or comprehension, however, makes conversation about provided materials difficult or impossible. For example, in conversation about provided materials, examiner can identify picture or naming card content from patient's response; 2 = Severe aphasia; all communication is through fragmentary expression; great need for inference, questioning, and guessing by the listener. Range of information that can be exchanged is limited; listener carries burden of communication. Examiner cannot identify materials provided from patient response; 3 = Mute, global aphasia; no usable speech or auditory comprehension  1  Dysarthria 0 = Normal; 1 = Mild-to-moderate dysarthria, patient slurs at least some words and, at worst, can be understood with some difficulty; 2 = Severe dysarthria, patient's speech is so slurred as to be unintelligible in the absence of or out of proportion to any dysphasia, or is mute/anarthric; UN = Intubated or other physical barrier  1  Extinction/Inattention  0 = No abnormality 1 = Visual, tactile, auditory, spatial, or personal extinction/inattention to bilateral simultaneous stimulation in one of the sensory modalities. 2 = Profound hemi-inattention or extinction to more than one modality; does not recognize own hand or orients to only one side of space.  0  Total   2   CV: s1s2 RRR, NSR on monitor, no r/m/g Pulm: Regular, non labored on 5 L [place], breath sounds clear-BUL & fine crackles/diminished-BLL GI: soft, rounded, non tender, bs x 4 GU: voiding with clear yellow urine Skin: no rashes/lesions noted Extremities: warm/dry, pulses + 2 R/P, no edema noted  Resolved Hospital Problem list     Assessment & Plan:  Suspected Ischemic Stroke  Hypertensive Urgency - improving TNKase administered 03:07 - Neuro checks & mNIHSS q 15  m x 2 h, Q 30 m x 6 h, Q 1 h x 16 h - NIHSS Q shift - perform bedside swallow > SLP eval if needed - PT/OT consult - Neurology consulted, appreciate input - consider f/u MRI/MRA   - Echocardiogram ordered - f/u Hgb A1c  -SCD's for VTE prophylaxis - continue labetalol PRN and nicardipine drip, wean as tolerated to maintain BP < 180/105 - strict bedrest 24 hours - fall precautions  Hyperlipidemia - will start 40 mg atorvastatin daily - monitor LFT's  Mild Hypokalemia - K+ supplementation ordered - daily BMP, replace electrolytes PRN  Palpitations - consider restarting outpatient regimen as patient stabilizes and is no longer requiring IV Cardene for BP control - propanolol PRN for sustained HR > 130 - f/u BNP  Bipolar disorder Anxiety - continue outpatient Lamictal & hydroxyzine PRN for anxiety  Best Practice (right click and "Reselect all SmartList Selections" daily)  Diet/type: NPO w/ oral meds DVT prophylaxis: SCD GI prophylaxis: PPI Lines: N/A Foley:  N/A Code Status:  full code Last date of multidisciplinary goals of care discussion [08/26/22]  Labs   CBC: Recent Labs  Lab 08/26/22 0236   WBC 9.7  NEUTROABS 6.0  HGB 13.4  HCT 40.8  MCV 92.1  PLT 256    Basic Metabolic Panel: No results for input(s): "NA", "K", "CL", "CO2", "GLUCOSE", "BUN", "CREATININE", "CALCIUM", "MG", "PHOS" in the last 168 hours. GFR: CrCl cannot be calculated (Patient's most recent lab result is older than the maximum 21 days allowed.). Recent Labs  Lab 08/26/22 0236  WBC 9.7    Liver Function Tests: No results for input(s): "AST", "ALT", "ALKPHOS", "BILITOT", "PROT", "ALBUMIN" in the last 168 hours. No results for input(s): "LIPASE", "AMYLASE" in the last 168 hours. No results for input(s): "AMMONIA" in the last 168 hours.  ABG No results found for: "PHART", "PCO2ART", "PO2ART", "HCO3", "TCO2", "ACIDBASEDEF", "O2SAT"   Coagulation Profile: Recent Labs  Lab 08/26/22 0255  INR 1.0    Cardiac Enzymes: No results for input(s): "CKTOTAL", "CKMB", "CKMBINDEX", "TROPONINI" in the last 168 hours.  HbA1C: No results found for: "HGBA1C"  CBG: Recent Labs  Lab 08/26/22 0235  GLUCAP 102*    Review of Systems: Positives in BOLD  Gen: Denies fever, chills, weight change, fatigue, night sweats HEENT: Denies blurred vision, double vision, hearing loss, tinnitus, sinus congestion, rhinorrhea, sore throat, neck stiffness, dysphagia PULM: Denies shortness of breath, cough, sputum production, hemoptysis, wheezing CV: Denies chest pain, edema, orthopnea, paroxysmal nocturnal dyspnea, palpitations GI: Denies abdominal pain, nausea, vomiting, diarrhea, hematochezia, melena, constipation, change in bowel habits GU: Denies dysuria, hematuria, polyuria, oliguria, urethral discharge Endocrine: Denies hot or cold intolerance, polyuria, polyphagia or appetite change Derm: Denies rash, dry skin, scaling or peeling skin change Heme: Denies easy bruising, bleeding, bleeding gums Neuro: Denies headache, numbness, weakness, right sided facial droop, loss of speech, loss of memory or consciousness  Past  Medical History:  She,  has a past medical history of Anxiety, Melanoma of upper arm ([place]), MVP (mitral valve prolapse), and Primary osteoarthritis of left knee (08/15/2014).   Surgical History:   Past Surgical History:  Procedure Laterality Date   ARTHROSCOPIC REPAIR ACL     BREAST BIOPSY Left 06/23/2013   u/s bx- neg   BREAST EXCISIONAL BIOPSY Left 07/14/2013   us/bx-neg   MELANOMA EXCISION     left arm.    REPLACEMENT TOTAL KNEE     left      Social History:  reports that she has never smoked. She has never used smokeless tobacco. She reports that she does not drink alcohol and does not use drugs.   Family History:  Her family history includes Heart attack in her brother; Heart disease in her brother and father; Hyperlipidemia in her brother and father; Hypertension in her brother and father. There is no history of Breast cancer.   Allergies Allergies  Allergen Reactions   Codeine Nausea Only   Erythromycin Nausea Only   Nsaids    Ibuprofen Nausea Only     Home Medications  Prior to Admission medications   Medication Sig Start Date End Date Taking? Authorizing Provider  amoxicillin (AMOXIL) 500 MG capsule Take by mouth. 08/25/22   [provider]  atenolol (TENORMIN) 25 MG tablet Take 1 tablet (25 mg total) by mouth 2 (two) times daily as needed. 06/20/22   Gwyneth Sprout, FNP  clonazePAM (KLONOPIN) 0.5 MG tablet Take 0.5 tablets (0.25 mg total) by mouth 2 (two) times daily as needed for anxiety. Due for in office follow up. 06/13/22   Gwyneth Sprout, FNP  hydrochlorothiazide (HYDRODIURIL) 12.5 MG tablet Take 1 tablet (12.5 mg total) by mouth daily. 01/29/21   Virginia Crews, MD  hydrOXYzine (ATARAX) 10 MG tablet TAKE ONE TABLET BY MOUTH THREE TIMES A DAY AS NEEDED 04/29/22   Bacigalupo, Dionne Bucy, MD  lamoTRIgine (LAMICTAL) 100 MG tablet TAKE ONE TABLET BY MOUTH TWICE A DAY 04/29/22   Bacigalupo, Dionne Bucy, MD  lamoTRIgine (LAMICTAL) 150 MG tablet Take 1 tablet  (150 mg total) by mouth 2 (two) times daily. 10/07/21   Bacigalupo, Dionne Bucy, MD  ondansetron (ZOFRAN) 4 MG tablet Take 1 tablet (4 mg total) by mouth every 8 (eight) hours as needed for nausea or vomiting. 01/29/21   Virginia Crews, MD  propranolol (INDERAL) 10 MG tablet Take 1 tablet (10 mg total) by mouth 4 (four) times daily as needed. CALL AND SCHEDULE OVERDUE APPOINTMENT TO RECEIVE FURTHER REFILLS. [phone]. 1ST ATTEMPT. 06/30/22   Nahser, Wonda Cheng, MD  traZODone (DESYREL) 100 MG tablet Take 1 tablet (100 mg total) by mouth at bedtime as needed for sleep. Please schedule office visit before any future refill. 07/18/22   Gwyneth Sprout, FNP     Critical care time: 55 minutes       Venetia Night, AGACNP-BC Acute Care Nurse Practitioner [facility]   [phone] / [phone] Please see Amion for pager details.

## 2022-08-26 NOTE — Consult Note (Addendum)
[facility] TeleNeurology Consult Services   Patient Name:   Kathleen Perkins, Kathleen Perkins Date of Birth:   1961/09/13 Identification Number:   MRN - [id number] Date of Service:   08/26/2022 02:37:19  Diagnosis:       I63.9 - Cerebrovascular accident (CVA), unspecified mechanism ([place])  Impression:      Dx: Inabilility to speak and right facial droop. Etiology: suspicious for Lt MCA stroke. Anarthria is a possibility since patient has intact comprehension and could write. CT showed no hemorrhage. Candidate for IV thrombolytics after discussion with her patient. CTA/P are pending.  Recommend ICU admission after advanced imaging if no LVO is found. No antiplatelet/AC. Post thrombolytic protocol. FS goal of 140-180 mg/dl, treat hyperglycemia. Mechanical VTE prophylaxis. Avoid hypotension and hypo/hyperglycemia. BP <180/105 mmHg.  Brain MRI w/o gad, TTE, Lipid panel and HBA1C. PT/OT/Speech eval.   ADDENDUM: CTA showed no LVO. CTP showed no mismatch. Not a candidate for intervention.   Our recommendations are outlined below. Recommendations: IV Tenecteplase recommended.  I confirmed the following. (Patient name, DOB, MRN, Blood Pressure, dose of Thrombolytic and waste, weight completed by stretcher/scale not stated weight, have ED staff inform ED MD of thrombolytic decision) Thrombolytic bolus given Without Complication.   IV Tenecteplase Total Dose - 21.1 mg   Routine post Thrombolytic monitoring including neuro checks and blood pressure control during/after treatment Monitor blood pressure Check blood pressure and neuro assessment every 15 min for 2 h, Fusaye Wachtel every 30 min for 6 h, and finally every hour for 16 h.  Manage Blood Pressure per post Thrombolytic protocol.        Follow designated hospital protocol for admission and post thrombolytic care       CT brain 24 hours post Thrombolytic       NPO until swallowing screen performed and passed       No antiplatelet agents or  anticoagulants (including heparin for DVT prophylaxis) in first 24 hours       No Foley catheter, nasogastric tube, arterial catheter or central venous catheter for 24 hr, unless absolutely necessary       Telemetry       Bedside swallow evaluation       HOB less than 30 degrees       Euglycemia       Avoid hyperthermia, PRN acetaminophen       DVT prophylaxis       Inpatient Neurology Consultation       Stroke evaluation as per inpatient neurology recommendations  Discussed with ED physician    ------------------------------------------------------------------------------  Advanced Imaging: Advanced imaging has been ordered. Results pending.   Metrics: Last Known Well: 08/26/2022 01:45:00 [facility] Notification Time: 08/26/2022 02:37:19 Arrival Time: 08/26/2022 02:20:00 Stamp Time: 08/26/2022 02:37:19 Initial Response Time: 08/26/2022 02:42:20 Symptoms: right arm weakness and aphasia . Initial patient interaction: 08/26/2022 02:47:07 NIHSS Assessment Completed: 08/26/2022 02:54:13 Patient is a candidate for Thrombolytic. Thrombolytic Medical Decision: 08/26/2022 02:54:14 Needle Time: 08/26/2022 03:07:57 Weight Noted by Staff: 84.5 kg  I personally Reviewed the CT Head and it Showed no hemorrhage.  Primary Provider Notified of Diagnostic Impression and Management Plan on: 08/26/2022 03:16:19    ------------------------------------------------------------------------------  Additional Thrombolytic Comments:     Delays related to blood pressure management  Thrombolytic Contraindications:  Last Known Well > 4.5 hours: No CT Head showing hemorrhage: No Ischemic stroke within 3 months: No Severe head trauma within 3 months: No Intracranial/intraspinal surgery within 3 months: No History of intracranial hemorrhage: No Symptoms and signs consistent with  an SAH: No GI malignancy or GI bleed within 21 days: No Coagulopathy: Platelets <100 000 /mm3, INR >1.7,  aPTT>40 s, or PT >15 s: No Treatment dose of LMWH within the previous 24 hrs: No Use of NOACs in past 48 hours: No Glycoprotein IIb/IIIa receptor inhibitors use: No Symptoms consistent with infective endocarditis: No Suspected aortic arch dissection: No Intra-axial intracranial neoplasm: No  Thrombolytic Decision and Management Plan: Management with thrombolytic treatment was explained to the Patient as was risks and benefits and alternatives to the treatment. Patient agrees with the decision to proceed with thrombolytic treatment. . All questions were answered and the Patient expressed understanding of the treatment plan.   History of Present Illness: Patient is a 61 year old Female.  Patient was brought by private transportation with symptoms of right arm weakness and aphasia . 61 years old woman with PMHx of MVP presenting for evaluation of acute onset aphasia. Last known well time is 145 AM today. Even though patient has expressive aphasia, she could was able to write and attested that she was at baseline at 1:45 AM. She also talked to her significant other and was normal. Significant other heard a scream and found her with aphasia. No history of stroke. Patient is not on anticoagulation therapy.   Past Medical History:      Hyperlipidemia      There is no history of Stroke [place] PMH:  MVP, Bipolar disorder, Anxiety and Depression  Medications:  No Anticoagulant use  Antiplatelet use: Yes ASA 325 mg Reviewed EMR for current medications  Allergies:  Reviewed  Social History: Drug Use: No  Family History:  There Is Family History Of:non contributory There is no family history of premature cerebrovascular disease pertinent to this consultation  ROS : 14 Points Review of Systems was performed and was negative except mentioned in HPI.  Past Surgical History: There Is No Surgical History Contributory To Today's Visit   Examination: BP(153/98), Pulse(66), Blood  Glucose(102) 1A: Level of Consciousness - Alert; keenly responsive + 0 1B: Ask Month and Age - Could Not Answer Either Question Correctly + 2 1C: Blink Eyes & Squeeze Hands - Performs Both Tasks + 0 2: Test Horizontal Extraocular Movements - Normal + 0 3: Test Visual Fields - No Visual Loss + 0 4: Test Facial Palsy (Use Grimace if Obtunded) - Partial paralysis (lower face) + 2 5A: Test Left Arm Motor Drift - No Drift for 10 Seconds + 0 5B: Test Right Arm Motor Drift - No Drift for 10 Seconds + 0 6A: Test Left Leg Motor Drift - No Drift for 5 Seconds + 0 6B: Test Right Leg Motor Drift - No Drift for 5 Seconds + 0 7: Test Limb Ataxia (FNF/Heel-Shin) - No Ataxia + 0 8: Test Sensation - Normal; No sensory loss + 0 9: Test Language/Aphasia - Severe Aphasia: Fragmentary Expression, Inference Needed, Cannot Identify Materials + 2 10: Test Dysarthria - Mild-Moderate Dysarthria: Slurring but can be understood + 1 11: Test Extinction/Inattention - No abnormality + 0  NIHSS Score: 7 NIHSS Free Text : 2:56, Labetalol 10 mg given 3:04, Labetalol 10 mg given  Pre-Morbid Modified Rankin Scale: 0 Points = No symptoms at all  Spoke with : Beather Arbour MD  Patient/Family was informed the Neurology Consult would occur via TeleHealth consult by way of interactive audio and video telecommunications and consented to receiving care in this manner.   Patient is being evaluated for possible acute neurologic impairment and high probability of  imminent or life-threatening deterioration. I spent total of 55 minutes providing care to this patient, including time for face to face visit via telemedicine, review of medical records, imaging studies and discussion of findings with providers, the patient and/or family.   Dr Rica Records Marlie Kuennen   [facility] For Inpatient follow-up with [facility] physician please call [facility] [phone]. This is not an outpatient service. Post hospital discharge, please contact hospital  directly.

## 2022-08-27 ENCOUNTER — Other Ambulatory Visit (HOSPITAL_COMMUNITY): Payer: Self-pay

## 2022-08-27 ENCOUNTER — Inpatient Hospital Stay: Payer: BC Managed Care – PPO

## 2022-08-27 ENCOUNTER — Telehealth (HOSPITAL_COMMUNITY): Payer: Self-pay | Admitting: Pharmacy Technician

## 2022-08-27 DIAGNOSIS — I480 Paroxysmal atrial fibrillation: Secondary | ICD-10-CM

## 2022-08-27 DIAGNOSIS — I4891 Unspecified atrial fibrillation: Secondary | ICD-10-CM | POA: Diagnosis not present

## 2022-08-27 DIAGNOSIS — I639 Cerebral infarction, unspecified: Secondary | ICD-10-CM | POA: Diagnosis not present

## 2022-08-27 LAB — ECHOCARDIOGRAM COMPLETE
AR max vel: 3.06 cm2
AV Area VTI: 3.06 cm2
AV Area mean vel: 2.77 cm2
AV Mean grad: 4.1 mmHg
AV Peak grad: 7 mmHg
Ao pk vel: 1.32 m/s
S' Lateral: 2.5 cm
Weight: 2980.62 oz

## 2022-08-27 LAB — CBC WITH DIFFERENTIAL/PLATELET
Abs Immature Granulocytes: 0.06 10*3/uL (ref 0.00–0.07)
Basophils Absolute: 0 10*3/uL (ref 0.0–0.1)
Basophils Relative: 0 %
Eosinophils Absolute: 0 10*3/uL (ref 0.0–0.5)
Eosinophils Relative: 0 %
HCT: 40.7 % (ref 36.0–46.0)
Hemoglobin: 13.5 g/dL (ref 12.0–15.0)
Immature Granulocytes: 1 %
Lymphocytes Relative: 13 %
Lymphs Abs: 1.6 10*3/uL (ref 0.7–4.0)
MCH: 30.1 pg (ref 26.0–34.0)
MCHC: 33.2 g/dL (ref 30.0–36.0)
MCV: 90.6 fL (ref 80.0–100.0)
Monocytes Absolute: 1 10*3/uL (ref 0.1–1.0)
Monocytes Relative: 8 %
Neutro Abs: 9.3 10*3/uL — ABNORMAL HIGH (ref 1.7–7.7)
Neutrophils Relative %: 78 %
Platelets: 342 10*3/uL (ref 150–400)
RBC: 4.49 MIL/uL (ref 3.87–5.11)
RDW: 13.1 % (ref 11.5–15.5)
WBC: 12 10*3/uL — ABNORMAL HIGH (ref 4.0–10.5)
nRBC: 0 % (ref 0.0–0.2)

## 2022-08-27 LAB — BASIC METABOLIC PANEL
Anion gap: 10 (ref 5–15)
BUN: 10 mg/dL (ref 6–20)
CO2: 24 mmol/L (ref 22–32)
Calcium: 9.2 mg/dL (ref 8.9–10.3)
Chloride: 101 mmol/L (ref 98–111)
Creatinine, Ser: 0.68 mg/dL (ref 0.44–1.00)
GFR, Estimated: >60 mL/min (ref >60)
Glucose, Bld: 114 mg/dL — ABNORMAL HIGH (ref 70–99)
Potassium: 3.7 mmol/L (ref 3.5–5.1)
Sodium: 135 mmol/L (ref 135–145)

## 2022-08-27 LAB — PHOSPHORUS: Phosphorus: 4.1 mg/dL (ref 2.5–4.6)

## 2022-08-27 LAB — MAGNESIUM: Magnesium: 1.8 mg/dL (ref 1.7–2.4)

## 2022-08-27 MED ORDER — DILTIAZEM HCL-DEXTROSE 125-5 MG/125ML-% IV SOLN (PREMIX)
5.0000 mg/h | INTRAVENOUS | Status: DC
  Administered 2022-08-27: 5 mg/h via INTRAVENOUS
  Filled 2022-08-27: qty 125

## 2022-08-27 MED ORDER — DILTIAZEM HCL 30 MG PO TABS: 30.0000 mg | ORAL_TABLET | Freq: Four times a day (QID) | ORAL | Status: DC

## 2022-08-27 MED ORDER — DILTIAZEM HCL 25 MG/5ML IV SOLN
5.0000 mg | Freq: Once | INTRAVENOUS | Status: AC
  Administered 2022-08-27: 5 mg via INTRAVENOUS
  Filled 2022-08-27: qty 5

## 2022-08-27 MED ORDER — DILTIAZEM HCL 60 MG PO TABS
60.0000 mg | ORAL_TABLET | Freq: Three times a day (TID) | ORAL | Status: DC
  Administered 2022-08-27 – 2022-08-28 (×4): 60 mg via ORAL
  Filled 2022-08-27 (×4): qty 1

## 2022-08-27 MED ORDER — METOPROLOL TARTRATE 50 MG PO TABS
50.0000 mg | ORAL_TABLET | Freq: Two times a day (BID) | ORAL | Status: DC
  Administered 2022-08-27 – 2022-08-29 (×4): 50 mg via ORAL
  Filled 2022-08-27 (×4): qty 1

## 2022-08-27 MED ORDER — METOPROLOL TARTRATE 5 MG/5ML IV SOLN: 5.0000 mg | Freq: Once | INTRAVENOUS | Status: DC

## 2022-08-27 MED ORDER — ONDANSETRON HCL 4 MG/2ML IJ SOLN: 4.0000 mg | Freq: Four times a day (QID) | INTRAMUSCULAR | Status: DC | PRN

## 2022-08-27 MED ORDER — ONDANSETRON HCL 4 MG/2ML IJ SOLN
INTRAMUSCULAR | Status: AC
  Administered 2022-08-27: 4 mg via INTRAVENOUS
  Filled 2022-08-27: qty 2

## 2022-08-27 MED ORDER — POTASSIUM CHLORIDE CRYS ER 20 MEQ PO TBCR
40.0000 meq | EXTENDED_RELEASE_TABLET | Freq: Once | ORAL | Status: AC
  Administered 2022-08-27: 40 meq via ORAL
  Filled 2022-08-27: qty 2

## 2022-08-27 MED ORDER — METOPROLOL TARTRATE 25 MG PO TABS
25.0000 mg | ORAL_TABLET | Freq: Once | ORAL | Status: AC
  Administered 2022-08-27: 25 mg via ORAL
  Filled 2022-08-27: qty 1

## 2022-08-27 MED ORDER — MAGNESIUM SULFATE 2 GM/50ML IV SOLN
2.0000 g | Freq: Once | INTRAVENOUS | Status: AC
  Administered 2022-08-27: 2 g via INTRAVENOUS
  Filled 2022-08-27: qty 50

## 2022-08-27 MED ORDER — METOPROLOL TARTRATE 5 MG/5ML IV SOLN
5.0000 mg | INTRAVENOUS | Status: AC
  Administered 2022-08-27: 5 mg via INTRAVENOUS

## 2022-08-27 MED ORDER — DILTIAZEM LOAD VIA INFUSION
10.0000 mg | Freq: Once | INTRAVENOUS | Status: AC
  Administered 2022-08-27: 10 mg via INTRAVENOUS
  Filled 2022-08-27: qty 10

## 2022-08-27 MED ORDER — APIXABAN 5 MG PO TABS
5.0000 mg | ORAL_TABLET | Freq: Two times a day (BID) | ORAL | Status: DC
  Administered 2022-08-27 – 2022-08-29 (×5): 5 mg via ORAL
  Filled 2022-08-27 (×5): qty 1

## 2022-08-27 NOTE — Progress Notes (Signed)
NAME:  Kathleen Perkins, MRN:  [id number], DOB:  [birth year], LOS: 1 ADMISSION DATE:  08/26/2022, CONSULTATION DATE:  08/26/22 REFERRING MD:  Dr. Beather Arbour, CHIEF COMPLAINT: CODE STROKE  History of Present Illness:  61 yo F presenting to [facility] ED from home for evaluation of anarthria and RIGHT facial droop.  The patient reports being in her normal state of health until 01:50-2:00 am on 08/26/22. She works as a Marine scientist at [employer] and got home around 01:00 am. She had a beer with her husband and was getting ready for bed. She noticed that when she was looking at her phone there was an "aura" or something looked "odd". She tried to speak and couldn't. She then took 325 mg of ASA and noticed the her right side of her mouth was numb and drooping. She came right to the ED for evaluation. She reports having palpitations and anxiety at baseline and takes her medication regularly. She denies any tobacco or illicit drug use, she does admit to drinking about 12 beers a week. She denies headache, syncope, weakness, dizziness, CP, SOB or any sick symptoms. Of note she was recently in the ED with nonspecific chest pain with associated SOB. Work up was essentially benign and the patient was scheduled for an upcoming appointment. She was significantly hypertensive at the ED visit.   Significant Hospital Events: Including procedures, antibiotic start and stop dates in addition to other pertinent events   08/26/22: Admit to ICU with suspected acute ischemic stroke status post TNKase administration. 08/27/22- patient is improved, shes on eliquis and s/p TNK.  She had cardiology evalution for new onset AF. She has been on CIWA for etoh/thc use history. She has bipolar disorder and is on home lamictal and remainder of her regimen. She has been transferred to [facility] service and is being optimized for DC planning.  Interim History / Subjective:  Patient awake and alert, able to speak with mild dysarthria and aphasia  Objective   Blood  pressure 129/89, pulse 78, temperature 97.8 F (36.6 C), temperature source Oral, resp. rate 16, weight 84.3 kg, SpO2 96 %.    FiO2 (%):  [21 %] 21 %   Intake/Output Summary (Last 24 hours) at 08/27/2022 1703 Last data filed at 08/27/2022 1600 Gross per 24 hour  Intake 1130.66 ml  Output 1550 ml  Net -419.34 ml   Filed Weights   08/26/22 0255 08/27/22 0140  Weight: 84.5 kg 84.3 kg    Examination: General: Adult female, critically ill, lying in bed, NAD HEENT: MM pink/moist, anicteric, atraumatic, neck supple Neuro: A&O x 4, able to follow commands, PERRL +3, MAE  Neurological Exam Level of arousal and orientation to time, place, and person were intact. Language including expression, naming, repetition, comprehension was assessed and found to have mild dysarthria & aphasia. Attention span and concentration were normal Recent and remote memory were intact Fund of Knowledge was assessed and was intact CN: II - Visual field intact OU III, IV, VI - Extraocular movements intact. V - Facial sensation intact bilaterally. VII - Facial movement intact bilaterally VIII - Hearing & vestibular intact bilaterally X - Palate elevates symmetrically XI - Chin turning & shoulder shrug intact bilaterally. XII - Tongue protrusion intact Motor Strength - strength 5/5 all extremities and pronator drift was absent.  Sensory - Light touch was assessed and was symmetrical Coordination - The patient had normal movements in the hands and feet with no ataxia or dysmetria.  Tremor was absent Gait and Station -  deferred.  NIHSS Level of  Consciousness: 0 = Alert, keenly responsive; 1 = Not alert, but arousable by minor stimulation to obey, answer, or respond; 2 = Not alert, requires repeated stimulation to attend, or is obtunded and requires strong or painful stimulation to make movements (not stereotyped); 3 = Responds only with reflex motor or autonomic effects or totally unresponsive, flaccid, and  areflexic   0  LOC Questions 0 = Answers both questions correctly; 1 = Answers one question correctly; 2 = Answers neither question correctly.  0  LOC Commands 0 = Performs both tasks correctly; 1 = Performs one task correctly; 2 = Performs neither task correctly  0  Best Gaze  0 = Normal; 1 = Partial gaze palsy; gaze is abnormal in one or both eyes, but forced deviation or total gaze paresis is not present; 2 = Forced deviation, or total gaze paresis not overcome by the oculocephalic maneuver   0  Visual 0 = No visual loss; 1 = Partial hemianopia; 2 = Complete hemianopia; 3 = Bilateral hemianopia (blind including cortical blindness).   0  Facial Palsy 0 = Normal symmetrical movements; 1 = Minor paralysis (flattened nasolabial fold, asymmetry on smiling); 2 = Partial paralysis (total or near-total paralysis of lower face); 3 = Complete paralysis of one or both sides (absence of facial movement in the upper and lower face).  0  Motor Arm LEFT 0 = No drift; limb holds 90 (or 45) degrees for full 10 secs; 1 = Drift; limb holds 90 (or 45) degrees, but drifts down before full 10 seconds; does not hit bed or other support; 2 = Some effort against gravity; limb cannot get to or maintain (if cued) 90 (or 45) degrees, drifts down to bed, but has some effort against gravity; 3 = No effort against gravity; limb falls; 4 = No movement; UN = unable to test (Amputation or joint fusion).   0  Motor Arm RIGHT 0 = No drift; limb holds 90 (or 45) degrees for full 10 secs; 1 = Drift; limb holds 90 (or 45) degrees, but drifts down before full 10 seconds; does not hit bed or other support; 2 = Some effort against gravity; limb cannot get to or maintain (if cued) 90 (or 45) degrees, drifts down to bed, but has some effort against gravity; 3 = No effort against gravity; limb falls; 4 = No movement; UN = unable to test (Amputation or joint fusion).   0  Motor Leg LEFT 0 = No drift; leg holds 30 degree position for full 5 secs; 1 =  Drift; leg falls by the end of the 5-sec period but does not hit bed; 2 = Some effort against gravity; leg falls to bed by 5 secs, but has some effort against gravity; 3 = No effort against gravity; leg falls to bed immediately; 4 = No movement; UN = unable to test (Amputation or joint fusion).   0  Motor Leg RIGHT 0 = No drift; leg holds 30 degree position for full 5 secs; 1 = Drift; leg falls by the end of the 5-sec period but does not hit bed; 2 = Some effort against gravity; leg falls to bed by 5 secs, but has some effort against gravity; 3 = No effort against gravity; leg falls to bed immediately; 4 = No movement; UN = unable to test (Amputation or joint fusion).   0  Limb Ataxia 0 = Absent; 1 = Present in one limb; 2 = Present  in two limbs; UN = Amputation or joint fusion   0  Sensory  0 = Normal, no sensory loss; 1 = Mild-to-moderate sensory loss, patient feels pinprick is less sharp or is dull on the affected side, or there is a loss of superficial pain with pinprick, but patient is aware of being touched; 2 = Severe to total sensory loss, patient is not aware of being touched in the face, arm, and leg.   0  Best Language 0 = No aphasia; normal; 1 = Mild-to-moderate aphasia; some obvious loss of fluency or facility of comprehension, w/o significant limitation on ideas expressed or form of expression. Reduction of speech and/or comprehension, however, makes conversation about provided materials difficult or impossible. For example, in conversation about provided materials, examiner can identify picture or naming card content from patient's response; 2 = Severe aphasia; all communication is through fragmentary expression; great need for inference, questioning, and guessing by the listener. Range of information that can be exchanged is limited; listener carries burden of communication. Examiner cannot identify materials provided from patient response; 3 = Mute, global aphasia; no usable speech or auditory  comprehension  1  Dysarthria 0 = Normal; 1 = Mild-to-moderate dysarthria, patient slurs at least some words and, at worst, can be understood with some difficulty; 2 = Severe dysarthria, patient's speech is so slurred as to be unintelligible in the absence of or out of proportion to any dysphasia, or is mute/anarthric; UN = Intubated or other physical barrier  1  Extinction/Inattention 0 = No abnormality 1 = Visual, tactile, auditory, spatial, or personal extinction/inattention to bilateral simultaneous stimulation in one of the sensory modalities. 2 = Profound hemi-inattention or extinction to more than one modality; does not recognize own hand or orients to only one side of space.  0  Total   2   CV: s1s2 RRR, NSR on monitor, no r/m/g Pulm: Regular, non labored on 5 L [place], breath sounds clear-BUL & fine crackles/diminished-BLL GI: soft, rounded, non tender, bs x 4 GU: voiding with clear yellow urine Skin: no rashes/lesions noted Extremities: warm/dry, pulses + 2 R/P, no edema noted  Resolved Hospital Problem list     Assessment & Plan:  Suspected Ischemic Stroke  Hypertensive Urgency - improving TNKase administered 03:07 - Neuro checks & mNIHSS q 15 m x 2 h, Q 30 m x 6 h, Q 1 h x 16 h - NIHSS Q shift - perform bedside swallow > SLP eval if needed - PT/OT consult - Neurology consulted, appreciate input - consider f/u MRI/MRA   - Echocardiogram ordered - f/u Hgb A1c  -SCD's for VTE prophylaxis - continue labetalol PRN and nicardipine drip, wean as tolerated to maintain BP < 180/105 - strict bedrest 24 hours - fall precautions  Hyperlipidemia - will start 40 mg atorvastatin daily - monitor LFT's  Mild Hypokalemia - K+ supplementation ordered - daily BMP, replace electrolytes PRN  Palpitations - consider restarting outpatient regimen as patient stabilizes and is no longer requiring IV Cardene for BP control - propanolol PRN for sustained HR > 130 - f/u BNP  Bipolar  disorder Anxiety - continue outpatient Lamictal & hydroxyzine PRN for anxiety  Best Practice (right click and "Reselect all SmartList Selections" daily)  Diet/type: NPO w/ oral meds DVT prophylaxis: SCD GI prophylaxis: PPI Lines: N/A Foley:  N/A Code Status:  full code Last date of multidisciplinary goals of care discussion [08/26/22]  Labs   CBC: Recent Labs  Lab 08/26/22 0236 08/27/22 0426  WBC 9.7 12.0*  NEUTROABS 6.0 9.3*  HGB 13.4 13.5  HCT 40.8 40.7  MCV 92.1 90.6  PLT 381 342     Basic Metabolic Panel: Recent Labs  Lab 08/26/22 0409 08/27/22 0426  NA 132* 135  K 3.4* 3.7  CL 98 101  CO2 24 24  GLUCOSE 124* 114*  BUN 16 10  CREATININE 0.92 0.68  CALCIUM 8.9 9.2  MG 2.1 1.8  PHOS 4.4 4.1   GFR: Estimated Creatinine Clearance: 78.5 mL/min (by C-G formula based on SCr of 0.68 mg/dL). Recent Labs  Lab 08/26/22 0236 08/27/22 0426  WBC 9.7 12.0*     Liver Function Tests: Recent Labs  Lab 08/26/22 0409  AST 23  ALT 19  ALKPHOS 77  BILITOT 0.7  PROT 7.8  ALBUMIN 4.5   No results for input(s): "LIPASE", "AMYLASE" in the last 168 hours. No results for input(s): "AMMONIA" in the last 168 hours.  ABG No results found for: "PHART", "PCO2ART", "PO2ART", "HCO3", "TCO2", "ACIDBASEDEF", "O2SAT"   Coagulation Profile: Recent Labs  Lab 08/26/22 0255  INR 1.0     Cardiac Enzymes: No results for input(s): "CKTOTAL", "CKMB", "CKMBINDEX", "TROPONINI" in the last 168 hours.  HbA1C: Hgb A1c MFr Bld  Date/Time Value Ref Range Status  08/26/2022 05:19 AM 5.6 4.8 - 5.6 % Final    Comment:    (NOTE) Pre diabetes:          5.7%-6.4%  Diabetes:              >6.4%  Glycemic control for   <7.0% adults with diabetes     CBG: Recent Labs  Lab 08/26/22 0235 08/26/22 0909  GLUCAP 102* 160*     Review of Systems: Positives in BOLD  Gen: Denies fever, chills, weight change, fatigue, night sweats HEENT: Denies blurred vision, double vision,  hearing loss, tinnitus, sinus congestion, rhinorrhea, sore throat, neck stiffness, dysphagia PULM: Denies shortness of breath, cough, sputum production, hemoptysis, wheezing CV: Denies chest pain, edema, orthopnea, paroxysmal nocturnal dyspnea, palpitations GI: Denies abdominal pain, nausea, vomiting, diarrhea, hematochezia, melena, constipation, change in bowel habits GU: Denies dysuria, hematuria, polyuria, oliguria, urethral discharge Endocrine: Denies hot or cold intolerance, polyuria, polyphagia or appetite change Derm: Denies rash, dry skin, scaling or peeling skin change Heme: Denies easy bruising, bleeding, bleeding gums Neuro: Denies headache, numbness, weakness, right sided facial droop, loss of speech, loss of memory or consciousness  Past Medical History:  She,  has a past medical history of Anxiety, Melanoma of upper arm ([place]), MVP (mitral valve prolapse), and Primary osteoarthritis of left knee (08/15/2014).   Surgical History:   Past Surgical History:  Procedure Laterality Date   ARTHROSCOPIC REPAIR ACL     BREAST BIOPSY Left 06/23/2013   u/s bx- neg   BREAST EXCISIONAL BIOPSY Left 07/14/2013   us/bx-neg   MELANOMA EXCISION     left arm.    REPLACEMENT TOTAL KNEE     left      Social History:   reports that she has never smoked. She has never used smokeless tobacco. She reports that she does not drink alcohol and does not use drugs.   Family History:  Her family history includes Heart attack in her brother; Heart disease in her brother and father; Hyperlipidemia in her brother and father; Hypertension in her brother and father. There is no history of Breast cancer.   Allergies Allergies  Allergen Reactions   Ivp Dye [Iodinated Contrast Media] Shortness Of Breath  Patient had wheezing and hypoxia after returning from IV dye CT   Codeine Nausea Only   Erythromycin Nausea Only   Nsaids    Ibuprofen Nausea Only     Home Medications  Prior to Admission  medications   Medication Sig Start Date End Date Taking? Authorizing Provider  amoxicillin (AMOXIL) 500 MG capsule Take by mouth. 08/25/22   [provider]  atenolol (TENORMIN) 25 MG tablet Take 1 tablet (25 mg total) by mouth 2 (two) times daily as needed. 06/20/22   Gwyneth Sprout, FNP  clonazePAM (KLONOPIN) 0.5 MG tablet Take 0.5 tablets (0.25 mg total) by mouth 2 (two) times daily as needed for anxiety. Due for in office follow up. 06/13/22   Gwyneth Sprout, FNP  hydrochlorothiazide (HYDRODIURIL) 12.5 MG tablet Take 1 tablet (12.5 mg total) by mouth daily. 01/29/21   Virginia Crews, MD  hydrOXYzine (ATARAX) 10 MG tablet TAKE ONE TABLET BY MOUTH THREE TIMES A DAY AS NEEDED 04/29/22   Bacigalupo, Dionne Bucy, MD  lamoTRIgine (LAMICTAL) 100 MG tablet TAKE ONE TABLET BY MOUTH TWICE A DAY 04/29/22   Bacigalupo, Dionne Bucy, MD  lamoTRIgine (LAMICTAL) 150 MG tablet Take 1 tablet (150 mg total) by mouth 2 (two) times daily. 10/07/21   Bacigalupo, Dionne Bucy, MD  ondansetron (ZOFRAN) 4 MG tablet Take 1 tablet (4 mg total) by mouth every 8 (eight) hours as needed for nausea or vomiting. 01/29/21   Virginia Crews, MD  propranolol (INDERAL) 10 MG tablet Take 1 tablet (10 mg total) by mouth 4 (four) times daily as needed. CALL AND SCHEDULE OVERDUE APPOINTMENT TO RECEIVE FURTHER REFILLS. [phone]. 1ST ATTEMPT. 06/30/22   Nahser, Wonda Cheng, MD  traZODone (DESYREL) 100 MG tablet Take 1 tablet (100 mg total) by mouth at bedtime as needed for sleep. Please schedule office visit before any future refill. 07/18/22   Gwyneth Sprout, FNP    Critical care provider statement:   Total critical care time: 33 minutes   Performed by: Lanney Gins MD   Critical care time was exclusive of separately billable procedures and treating other patients.   Critical care was necessary to treat or prevent imminent or life-threatening deterioration.   Critical care was time spent personally by me on the following activities:  development of treatment plan with patient and/or surrogate as well as nursing, discussions with consultants, evaluation of patient's response to treatment, examination of patient, obtaining history from patient or surrogate, ordering and performing treatments and interventions, ordering and review of laboratory studies, ordering and review of radiographic studies, pulse oximetry and re-evaluation of patient's condition.    Ottie Glazier, M.D.  Pulmonary & Critical Care Medicine

## 2022-08-27 NOTE — Progress Notes (Signed)
Neurology progress note  S: Patient's speech deficit significantly improved, still has intermittent mild dyarthria. Required cardizem gtt overnight for a fib with RVR  Head CT overnight 24 hrs post TNK showed no e/o hemorrhagic conversion  O:  Vitals:   08/27/22 1600 08/27/22 1630  BP: 129/89 129/89  Pulse:  78  Resp: 20 16  Temp: 97.8 F (36.6 C)   SpO2: 97% 96%   Physical Exam Gen: A&Ox4, NAD HEENT: Atraumatic, normocephalic; oropharynx clear, tongue without atrophy or fasciculations. Resp: CTAB, normal work of breathing CV: RRR, extremities appear well-perfused. Abd: soft/NT/ND Extrem: Nml bulk; no cyanosis, clubbing, or edema.   Neuro: *MS: A&O x4. Follows multi-step commands.  *Speech: minimal intermittent dysarthria, no aphasia, able to name and repeat. *CN:    I: Deferred   II,III: PERRLA, VFF by confrontation, optic discs not visualized 2/2 pupillary constriction   III,IV,VI: EOMI w/o nystagmus, no ptosis   V: Sensation intact from V1 to V3 to LT   VII: Eyelid closure was full.  Smile symmetric.   VIII: Hearing intact to voice   IX,X: Voice normal, palate elevates symmetrically    XI: SCM/trap 5/5 bilat   XII: Tongue protrudes midline, no atrophy or fasciculations  *Motor:   Normal bulk.  No tremor, rigidity or bradykinesia. No pronator drift.    Strength: Dlt Bic Tri WE WrF FgS Gr HF KnF KnE PlF DoF    Left '5 5 5 5 5 5 5 5 5 5 5 5    '$ Right '5 5 5 5 5 5 5 5 5 5 5 5    '$ *Sensory: Intact to light touch, pinprick, temperature vibration throughout. Symmetric. Propioception intact bilat.  No double-simultaneous extinction.  *Coordination:  Finger-to-nose, heel-to-shin, rapid alternating motions were intact. *Reflexes:  2+ and symmetric throughout without clonus; toes down-going bilat *Gait: deferred   NIHSS = 1 for dysarthria  Data:  Brain MRI shows 3 punctate ischemic strokes ranging in size from 3-6 mm in multiple vascular distributions (L precentral gyrus, L  insula, R temporoparietal). Distribution c/w central embolic source. Personally reviewed images with patient.   CTA/CTP   1. No evidence of infarct core or penumbra on CT perfusion. 2.  No intracranial large vessel occlusion or significant stenosis. 3. No hemodynamically significant stenosis in the neck. 4. Interlobular septal thickening in the imaged lungs, possibly mild pulmonary edema.  TTE - LVEF 55-60%, mod LVH, no intracardiac clot  BLE [name] - no e/o DVT  Stroke Labs     Component Value Date/Time   CHOL 223 (H) 08/26/2022 0409   CHOL 268 (H) 11/08/2020 1423   TRIG 117 08/26/2022 0409   HDL 87 08/26/2022 0409   HDL 93 11/08/2020 1423   CHOLHDL 2.6 08/26/2022 0409   VLDL 23 08/26/2022 0409   LDLCALC 113 (H) 08/26/2022 0409   LDLCALC 145 (H) 11/08/2020 1423   LABVLDL 30 11/08/2020 1423    Lab Results  Component Value Date/Time   HGBA1C 5.6 08/26/2022 05:19 AM   A/P:  A/P: 61 yo woman with hx MVP, HL, palpitations who presented after acute onset of dysarthria and aphasia, s/p TNK administered at 0300 on 9/26. She has been in a fib since arrival which is a new dx for her. MRI shows multiple small infarcts in multiple vascular distributions c/w central embolic source in the setting of a fib. Hospital stay has been complicated by a fib w/ RVR requiring cardizem gtt. Cardiology following. Head CT overnight at 24 hrs post-TNK showed  no e/o hemorrhage therefore OK to start eliquis '5mg'$  bid today for secondary stroke prevention in the setting of a fib. The size of her infarcts is small enough that anticoagulation restart need not be delayed any further (risk of hemorrhagic transformation on eliquis is lower than risk of recurrent ischemic stroke off anticoagulation). - OK to start eliquis '5mg'$  bid for secondary stroke prevention in setting of a fib - No neuro indication for antiplatelets in addition to therapeutic anticoagulation - Rate control per cardiology recs - STAT head CT for  any decline in neuro exam - Atorvastatin '80mg'$  daily - SLP - Stroke education - I will arrange outpatient f/u with neurology - Stroke workup is completed. Neurology to sign off, but please re-engage if any new neurologic concerns arise.  Su Monks, MD [facility] [phone]  If 7pm- 7am, please page neurology on call as listed in [place].   **Any copied and pasted documentation in this note was written by me in another application not separately billed for and pasted here by me.

## 2022-08-27 NOTE — Telephone Encounter (Signed)
Pharmacy Patient Advocate Encounter  Insurance verification completed.    The patient is insured through [employer]   The patient is currently admitted and ran test claims for the following: Eliquis.  Copays and coinsurance results were relayed to Inpatient clinical team.

## 2022-08-27 NOTE — Evaluation (Signed)
Physical Therapy Evaluation Patient Details Name: Kathleen Perkins MRN: [id number] DOB: 1961/04/21 Today's Date: 08/27/2022  History of Present Illness  60 yo woman with hx MVP, HL, palpitations who presented last night with aphasia and dysarthria and received TNK 2/2 c/f acute ischemic stroke. History includes biplar and anxiety. Cleared to participate with PT.  Clinical Impression  Pt is a pleasant 61 year old female who was admitted for CVA and is s/p TNK. Pt performs bed mobility with indep and transfers/ambulation with mod I and IV pole. No change in sensation/RAMP/strength. Currently at baseline level. HR does elevate to 133bpm with exertion. Educated pt on self pacing for activity tolerance. Vision screen negative. Discussed with patient/RN that pt impulsive and needs cues for safety regarding hospital equipment/line/leads. Pt does not require any further PT needs at this time. Pt will be dc in house and does not require follow up. RN aware. Will dc current orders.      Recommendations for follow up therapy are one component of a multi-disciplinary discharge planning process, led by the attending physician.  Recommendations may be updated based on patient status, additional functional criteria and insurance authorization.  Follow Up Recommendations No PT follow up      Assistance Recommended at Discharge PRN  Patient can return home with the following       Equipment Recommendations None recommended by PT  Recommendations for Other Services       Functional Status Assessment Patient has not had a recent decline in their functional status     Precautions / Restrictions Precautions Precautions: Fall Restrictions Weight Bearing Restrictions: No      Mobility  Bed Mobility Overal bed mobility: Independent             General bed mobility comments: quick and effortless. Impulsive    Transfers Overall transfer level: Independent Equipment used: None                General transfer comment: safe technique    Ambulation/Gait Ambulation/Gait assistance: Modified independent (Device/Increase time) Gait Distance (Feet): 200 Feet Assistive device: IV Pole Gait Pattern/deviations: WFL(Within Functional Limits)       General Gait Details: ambulated with good gait and station. Able to change gait speed and perform head movements without LOB. Pt able to push IV pole  Stairs            Wheelchair Mobility    Modified Rankin (Stroke Patients Only)       Balance Overall balance assessment: Independent (12/12 on ModDGI)                                           Pertinent Vitals/Pain Pain Assessment Pain Assessment: No/denies pain    Home Living Family/patient expects to be discharged to:: Private residence Living Arrangements: Spouse/significant other Available Help at Discharge: Family Type of Home: House           Home Equipment: None      Prior Function Prior Level of Function : Independent/Modified Independent;Working/employed;Driving             Mobility Comments: indep, driving, working as [employer] scientist at [employer] ADLs Comments: indep     Journalist, newspaper        Extremity/Trunk Assessment   Upper Extremity Assessment Upper Extremity Assessment: Overall WFL for tasks assessed    Lower Extremity Assessment Lower Extremity Assessment: Overall  WFL for tasks assessed       Communication   Communication: No difficulties  Cognition Arousal/Alertness: Awake/alert Behavior During Therapy: Impulsive, Restless Overall Cognitive Status: Within Functional Limits for tasks assessed                                 General Comments: impulsive trying to stand multiple times prior to therapist instruction. Also seems to lack safety awareness regarding IV line/leads        General Comments      Exercises     Assessment/Plan    PT Assessment Patient does not need any further  PT services  PT Problem List         PT Treatment Interventions      PT Goals (Current goals can be found in the Care Plan section)  Acute Rehab PT Goals Patient Stated Goal: to go home as quickly as possible PT Goal Formulation: All assessment and education complete, DC therapy Time For Goal Achievement: 08/27/22 Potential to Achieve Goals: Good    Frequency       Co-evaluation               AM-PAC PT "6 Clicks" Mobility  Outcome Measure Help needed turning from your back to your side while in a flat bed without using bedrails?: None Help needed moving from lying on your back to sitting on the side of a flat bed without using bedrails?: None Help needed moving to and from a bed to a chair (including a wheelchair)?: None Help needed standing up from a chair using your arms (e.g., wheelchair or bedside chair)?: None Help needed to walk in hospital room?: None Help needed climbing 3-5 steps with a railing? : None 6 Click Score: 24    End of Session Equipment Utilized During Treatment: Gait belt Activity Tolerance: Patient tolerated treatment well Patient left: in bed Nurse Communication: Mobility status PT Visit Diagnosis: Other symptoms and signs involving the nervous system (R29.898)    Time: 9924-2683 PT Time Calculation (min) (ACUTE ONLY): 16 min   Charges:   PT Evaluation $PT Eval Low Complexity: 1 Low PT Treatments $Gait Training: 8-22 mins        Greggory Stallion, PT, DPT, GCS [phone]   Aanchal Cope 08/27/2022, 3:26 PM

## 2022-08-27 NOTE — Progress Notes (Signed)
OT Cancellation Note  Patient Details Name: Kathleen Perkins MRN: [id number] DOB: 09-Feb-1961   Cancelled Treatment:    Reason Eval/Treat Not Completed: OT screened, no needs identified, will sign off. OT order received and chart reviewed. PT reports pt is at baseline level of function. Pt declined OT evaluation at this time. OT to complete orders.   Darleen Crocker, MS, OTR/L , CBIS ascom [phone]  08/27/22, 10:58 AM

## 2022-08-27 NOTE — Progress Notes (Signed)
After Dr. Lanney Gins spoke to patient, patient was very upset and wanted to leave AMA.  Patient verbalized that Dr. Lanney Gins "spoke down to her".  This nurse explained the importance of staying and being monitored after recent CVA, new onset of a-fib, and multiple new medications.  Patient agreed to stay.  Patient still very anxious and emotional.  CIWA score 13.  Ativan 1 mg PO administered.  Nurse will continue to monitor.

## 2022-08-27 NOTE — Progress Notes (Signed)
Rounding Note    Patient Name: Kathleen Perkins Date of Encounter: 08/27/2022  [place] Cardiologist: Mertie Moores, MD   Subjective   Denies palpitations or shortness of breath, will really like to go home today.  Currently on Cardizem IV 10 mg/h.   Inpatient Medications    Scheduled Meds:  apixaban  5 mg Oral BID   atorvastatin  40 mg Oral Daily   Chlorhexidine Gluconate Cloth  6 each Topical Daily   diltiazem  60 mg Oral TID   folic acid  1 mg Oral Daily   lamoTRIgine  100 mg Oral BID   LORazepam  0-4 mg Oral Q4H   Followed by   Derrill Memo ON 08/29/2022] LORazepam  0-4 mg Oral Q8H   metoprolol tartrate  50 mg Oral BID   multivitamin with minerals  1 tablet Oral Daily   pantoprazole  40 mg Oral Daily   thiamine  100 mg Oral Daily   Continuous Infusions:  diltiazem (CARDIZEM) infusion Stopped (08/27/22 1232)   PRN Meds: acetaminophen, docusate sodium, hydrOXYzine, LORazepam **OR** LORazepam, metoprolol tartrate, ondansetron (ZOFRAN) IV, mouth rinse, polyethylene glycol, propranolol   Vital Signs    Vitals:   08/27/22 0700 08/27/22 0730 08/27/22 1100 08/27/22 1235  BP: 129/80 113/89 115/80 128/79  Pulse: 84 (!) 56    Resp: (!) '22 13 20 18  '$ Temp:  97.9 F (36.6 C)    TempSrc:      SpO2: 97% 96%  100%  Weight:        Intake/Output Summary (Last 24 hours) at 08/27/2022 1351 Last data filed at 08/27/2022 1241 Gross per 24 hour  Intake 890.66 ml  Output 1550 ml  Net -659.34 ml      08/27/2022    1:40 AM 08/26/2022    2:55 AM 07/29/2022    2:52 PM  Last 3 Weights  Weight (lbs) 185 lb 13.6 oz 186 lb 4.6 oz 189 lb 6 oz  Weight (kg) 84.3 kg 84.5 kg 85.9 kg      Telemetry    Atrial fibrillation, heart rate 105-117- Personally Reviewed  ECG     - Personally Reviewed  Physical Exam   GEN: No acute distress.   Neck: No JVD Cardiac: Irregular irregular Respiratory: Clear to auscultation bilaterally. GI: Soft, nontender, non-distended  MS: No  edema; No deformity. Neuro:  Nonfocal  Psych: Normal affect   Labs    High Sensitivity Troponin:   Recent Labs  Lab 07/29/22 1220 08/26/22 0236 08/26/22 0409  TROPONINIHS '6 7 15     '$ Chemistry Recent Labs  Lab 08/26/22 0409 08/27/22 0426  NA 132* 135  K 3.4* 3.7  CL 98 101  CO2 24 24  GLUCOSE 124* 114*  BUN 16 10  CREATININE 0.92 0.68  CALCIUM 8.9 9.2  MG 2.1 1.8  PROT 7.8  --   ALBUMIN 4.5  --   AST 23  --   ALT 19  --   ALKPHOS 77  --   BILITOT 0.7  --   GFRNONAA >60 >60  ANIONGAP 10 10    Lipids  Recent Labs  Lab 08/26/22 0409  CHOL 223*  TRIG 117  HDL 87  LDLCALC 113*  CHOLHDL 2.6    Hematology Recent Labs  Lab 08/26/22 0236 08/27/22 0426  WBC 9.7 12.0*  RBC 4.43 4.49  HGB 13.4 13.5  HCT 40.8 40.7  MCV 92.1 90.6  MCH 30.2 30.1  MCHC 32.8 33.2  RDW 13.2 13.1  PLT 381 342   Thyroid  Recent Labs  Lab 08/26/22 1250  TSH 0.443    BNP Recent Labs  Lab 08/26/22 0236  BNP 327.3*    DDimer  Recent Labs  Lab 08/26/22 1250  DDIMER 3.75*     Radiology    ECHOCARDIOGRAM COMPLETE  Result Date: 08/27/2022    ECHOCARDIOGRAM REPORT   Patient Name:   [name] Date of Exam: 08/26/2022 Medical Rec #:  [id number]      Height:       64.0 in Accession #:    [id number]     Weight:       186.3 lb Date of Birth:  1961/06/26     BSA:          1.898 m Patient Age:    27 years       BP:           139/87 mmHg Patient Gender: F              HR:           122 bpm. Exam Location:  [facility] Procedure: 2D Echo, Cardiac Doppler and Color Doppler Indications:     I63.9 Stroke  History:         Patient has no prior history of Echocardiogram examinations.                  Mitral Valve Prolapse.  Sonographer:     Cresenciano Lick RDCS Referring Phys:  [id number] BRITTON L RUST-CHESTER Diagnosing Phys: Harrell Gave End MD IMPRESSIONS  1. Left ventricular ejection fraction, by estimation, is 55 to 60%. The left ventricle has normal function. The left ventricle has no  regional wall motion abnormalities. There is moderate left ventricular hypertrophy. Left ventricular diastolic function  could not be evaluated.  2. Right ventricular systolic function is normal. The right ventricular size is normal. There is normal pulmonary artery systolic pressure.  3. The mitral valve is abnormal. Mild mitral valve regurgitation. No evidence of mitral stenosis.  4. Tricuspid valve regurgitation is mild to moderate.  5. The aortic valve is tricuspid. There is mild thickening of the aortic valve. Aortic valve regurgitation is not visualized. Aortic valve sclerosis is present, with no evidence of aortic valve stenosis.  6. The inferior vena cava is normal in size with greater than 50% respiratory variability, suggesting right atrial pressure of 3 mmHg. FINDINGS  Left Ventricle: Left ventricular ejection fraction, by estimation, is 55 to 60%. The left ventricle has normal function. The left ventricle has no regional wall motion abnormalities. The left ventricular internal cavity size was normal in size. There is  moderate left ventricular hypertrophy. Left ventricular diastolic function could not be evaluated due to atrial fibrillation. Left ventricular diastolic function could not be evaluated. Right Ventricle: The right ventricular size is normal. No increase in right ventricular wall thickness. Right ventricular systolic function is normal. There is normal pulmonary artery systolic pressure. The tricuspid regurgitant velocity is 2.65 m/s, and  with an assumed right atrial pressure of 3 mmHg, the estimated right ventricular systolic pressure is 17.6 mmHg. Left Atrium: Left atrial size was normal in size. Right Atrium: Right atrial size was normal in size. Pericardium: There is no evidence of pericardial effusion. Mitral Valve: The mitral valve is abnormal. Mild to moderate mitral annular calcification. Mild mitral valve regurgitation. No evidence of mitral valve stenosis. Tricuspid Valve: The  tricuspid valve is normal in structure. Tricuspid valve regurgitation is mild to moderate.  Aortic Valve: The aortic valve is tricuspid. There is mild thickening of the aortic valve. Aortic valve regurgitation is not visualized. Aortic valve sclerosis is present, with no evidence of aortic valve stenosis. Aortic valve mean gradient measures 4.1  mmHg. Aortic valve peak gradient measures 7.0 mmHg. Aortic valve area, by VTI measures 3.06 cm. Pulmonic Valve: The pulmonic valve was not well visualized. Pulmonic valve regurgitation is not visualized. No evidence of pulmonic stenosis. Aorta: The aortic root and ascending aorta are structurally normal, with no evidence of dilitation. Pulmonary Artery: The pulmonary artery is of normal size. Venous: The inferior vena cava is normal in size with greater than 50% respiratory variability, suggesting right atrial pressure of 3 mmHg. IAS/Shunts: The interatrial septum was not well visualized.  LEFT VENTRICLE PLAX 2D LVIDd:         3.70 cm LVIDs:         2.50 cm LV PW:         1.40 cm LV IVS:        1.40 cm LVOT diam:     2.10 cm LV SV:         73 LV SV Index:   38 LVOT Area:     3.46 cm  RIGHT VENTRICLE             IVC RV Basal diam:  3.50 cm     IVC diam: 1.50 cm RV S prime:     14.53 cm/s TAPSE (M-mode): 2.1 cm LEFT ATRIUM             Index        RIGHT ATRIUM           Index LA diam:        4.60 cm 2.42 cm/m   RA Area:     15.00 cm LA Vol (A2C):   70.9 ml 37.35 ml/m  RA Volume:   36.50 ml  19.23 ml/m LA Vol (A4C):   39.2 ml 20.65 ml/m LA Biplane Vol: 56.7 ml 29.87 ml/m  AORTIC VALVE AV Area (Vmax):    3.06 cm AV Area (Vmean):   2.77 cm AV Area (VTI):     3.06 cm AV Vmax:           132.18 cm/s AV Vmean:          96.406 cm/s AV VTI:            0.238 m AV Peak Grad:      7.0 mmHg AV Mean Grad:      4.1 mmHg LVOT Vmax:         116.67 cm/s LVOT Vmean:        77.167 cm/s LVOT VTI:          0.210 m LVOT/AV VTI ratio: 0.88  AORTA Ao Root diam: 3.10 cm Ao Asc diam:  3.30 cm  MV E velocity: 145.67 cm/s  TRICUSPID VALVE                             TR Peak grad:   28.1 mmHg                             TR Vmax:        265.00 cm/s  SHUNTS                             Systemic VTI:  0.21 m                             Systemic Diam: 2.10 cm Nelva Bush MD Electronically signed by Nelva Bush MD Signature Date/Time: 08/27/2022/8:14:44 AM    Final    US Venous Img Lower Bilateral (DVT)  Result Date: 08/27/2022 CLINICAL DATA:  Bilateral lower extremity edema. History of melanoma. Evaluate for DVT. EXAM: BILATERAL LOWER EXTREMITY VENOUS DOPPLER ULTRASOUND TECHNIQUE: Gray-scale sonography with graded compression, as well as color Doppler and duplex ultrasound were performed to evaluate the lower extremity deep venous systems from the level of the common femoral vein and including the common femoral, femoral, profunda femoral, popliteal and calf veins including the posterior tibial, peroneal and gastrocnemius veins when visible. The superficial great saphenous vein was also interrogated. Spectral Doppler was utilized to evaluate flow at rest and with distal augmentation maneuvers in the common femoral, femoral and popliteal veins. COMPARISON:  None Available. FINDINGS: RIGHT LOWER EXTREMITY Common Femoral Vein: No evidence of thrombus. Normal compressibility, respiratory phasicity and response to augmentation. Saphenofemoral Junction: No evidence of thrombus. Normal compressibility and flow on color Doppler imaging. Profunda Femoral Vein: No evidence of thrombus. Normal compressibility and flow on color Doppler imaging. Femoral Vein: No evidence of thrombus. Normal compressibility, respiratory phasicity and response to augmentation. Popliteal Vein: No evidence of thrombus. Normal compressibility, respiratory phasicity and response to augmentation. Calf Veins: No evidence of thrombus. Normal compressibility and flow on color Doppler imaging. Superficial Great  Saphenous Vein: No evidence of thrombus. Normal compressibility. Other Findings:  None. LEFT LOWER EXTREMITY Common Femoral Vein: No evidence of thrombus. Normal compressibility, respiratory phasicity and response to augmentation. Saphenofemoral Junction: No evidence of thrombus. Normal compressibility and flow on color Doppler imaging. Profunda Femoral Vein: No evidence of thrombus. Normal compressibility and flow on color Doppler imaging. Femoral Vein: No evidence of thrombus. Normal compressibility, respiratory phasicity and response to augmentation. Popliteal Vein: No evidence of thrombus. Normal compressibility, respiratory phasicity and response to augmentation. Calf Veins: No evidence of thrombus. Normal compressibility and flow on color Doppler imaging. Superficial Great Saphenous Vein: No evidence of thrombus. Normal compressibility. Other Findings:  None. IMPRESSION: No evidence of DVT within either lower extremity. Electronically Signed   By: Sandi Mariscal M.D.   On: 08/27/2022 07:54   CT HEAD WO CONTRAST  Result Date: 08/27/2022 CLINICAL DATA:  Stroke follow-up EXAM: CT HEAD WITHOUT CONTRAST TECHNIQUE: Contiguous axial images were obtained from the base of the skull through the vertex without intravenous contrast. RADIATION DOSE REDUCTION: This exam was performed according to the departmental dose-optimization program which includes automated exposure control, adjustment of the mA and/or kV according to patient size and/or use of iterative reconstruction technique. COMPARISON:  MRI head 08/26/2022 and CT head 08/26/2022 FINDINGS: Brain: No intracranial hemorrhage, mass effect, or evidence of acute infarct. The small infarcts seen on MRI 08/26/2022 are not well demonstrated on CT. No hydrocephalus. No extra-axial fluid collection. Generalized cerebral atrophy. Ill-defined hypoattenuation within the cerebral white matter is nonspecific but consistent with chronic small vessel ischemic disease. Vascular:  No hyperdense vessel. Intracranial arterial calcification. Skull: No fracture or focal lesion. Sinuses/Orbits: No acute finding. Paranasal sinuses and mastoid air cells are well aerated. Other: None. IMPRESSION: No acute intracranial  abnormality on CT. The small infarcts on MRI 08/26/2022 are not well visualized. No hemorrhagic transformation or mass effect. Electronically Signed   By: Placido Sou M.D.   On: 08/27/2022 02:49   MR BRAIN WO CONTRAST  Result Date: 08/26/2022 CLINICAL DATA:  Provided history: Neuro deficit, acute, stroke suspected. EXAM: MRI HEAD WITHOUT CONTRAST TECHNIQUE: Multiplanar, multiecho pulse sequences of the brain and surrounding structures were obtained without intravenous contrast. COMPARISON:  Non-contrast head CT and CT angiogram head/neck 08/26/2022. FINDINGS: Brain: No age advanced or lobar predominant cerebral atrophy. Cerebellar atrophy. Punctate acute cortical infarct within the posterior left insula (series 5, image 24). 4 mm acute cortical infarct within the left precentral gyrus (series 5, image 38). 6 mm acute infarct within the right temporal occipital lobe periventricular white matter (series 5, image 20). Multifocal T2 FLAIR hyperintense signal abnormality within the cerebral white matter, nonspecific but compatible with mild chronic small vessel ischemic disease. No evidence of an intracranial mass. No chronic intracranial blood products. No extra-axial fluid collection. No midline shift. Vascular: Maintained flow voids within the proximal large arterial vessels. Skull and upper cervical spine: No focal suspicious marrow lesion. Sinuses/Orbits: No mass or acute finding within the imaged orbits. No significant paranasal sinus disease. Other: Trace fluid within the left mastoid air cells. IMPRESSION: 4 mm acute cortical infarct within the left precentral gyrus. Punctate acute cortical infarct within the posterior left insula. 6 mm acute infarct within the right  temporo-occipital lobe periventricular white matter. Given the involvement of multiple vascular territories, consider an embolic process. Mild chronic small vessel ischemic changes within the cerebral white matter. Cerebellar atrophy. Electronically Signed   By: Kellie Simmering D.O.   On: 08/26/2022 14:55   CT ANGIO HEAD NECK W WO CM W PERF (CODE STROKE)  Result Date: 08/26/2022 CLINICAL DATA:  Aphasia, vision changes EXAM: CT ANGIOGRAPHY HEAD AND NECK CT PERFUSION BRAIN TECHNIQUE: Multidetector CT imaging of the head and neck was performed using the standard protocol during bolus administration of intravenous contrast. Multiplanar CT image reconstructions and MIPs were obtained to evaluate the vascular anatomy. Carotid stenosis measurements (when applicable) are obtained utilizing NASCET criteria, using the distal internal carotid diameter as the denominator. Multiphase CT imaging of the brain was performed following IV bolus contrast injection. Subsequent parametric perfusion maps were calculated using RAPID software. RADIATION DOSE REDUCTION: This exam was performed according to the departmental dose-optimization program which includes automated exposure control, adjustment of the mA and/or kV according to patient size and/or use of iterative reconstruction technique. CONTRAST:  13m OMNIPAQUE IOHEXOL 350 MG/ML SOLN COMPARISON:  No prior CTA, correlation is made with CT head 08/26/2022 FINDINGS: CT HEAD FINDINGS For noncontrast findings, please see same day CT head. ASPECTS (ASpreckelsStroke Program Early CT Score): 10 CTA NECK FINDINGS Aortic arch: Standard branching. Imaged portion shows no evidence of aneurysm or dissection. No significant stenosis of the major arch vessel origins. Right carotid system: No evidence of dissection, occlusion, or hemodynamically significant stenosis (greater than 50%). Left carotid system: No evidence of dissection, occlusion, or hemodynamically significant stenosis (greater than  50%). Atherosclerotic disease at the bifurcation and in the proximal ICA is not hemodynamically significant. Vertebral arteries: Left dominant system. No evidence of dissection, occlusion, or hemodynamically significant stenosis (greater than 50%). Skeleton: Degenerative changes in the cervical spine. No acute osseous abnormality. Other neck: Small tracheal diverticulum.  Otherwise negative. Upper chest: Interlobular septal thickening, possibly mild pulmonary edema. Review of the MIP images confirms the above findings  CTA HEAD FINDINGS Anterior circulation: Both internal carotid arteries are patent to the termini, without significant stenosis. A1 segments patent. Normal anterior communicating artery. Anterior cerebral arteries are patent to their distal aspects. No M1 stenosis or occlusion. MCA branches perfused and symmetric. Posterior circulation: Vertebral arteries patent to the vertebrobasilar junction without stenosis. Posterior inferior cerebellar arteries patent proximally. Basilar patent to its distal aspect. Superior cerebellar arteries patent proximally. Patent P1 segments. PCAs perfused to their distal aspects without stenosis. The left posterior communicating artery is patent. The right is not visualized. Venous sinuses: As permitted by contrast timing, patent. Anatomic variants: None significant. Review of the MIP images confirms the above findings CT Brain Perfusion Findings: ASPECTS: 10 CBF (<30%) Volume: 91m Perfusion (Tmax>6.0s) volume: 04mMismatch Volume: 6m72mnfarction Location:None IMPRESSION: 1. No evidence of infarct core or penumbra on CT perfusion. 2.  No intracranial large vessel occlusion or significant stenosis. 3. No hemodynamically significant stenosis in the neck. 4. Interlobular septal thickening in the imaged lungs, possibly mild pulmonary edema. Electronically Signed   By: AliMerilyn BabaD.   On: 08/26/2022 03:41   DG Chest Port 1 View  Result Date: 08/26/2022 CLINICAL DATA:   Stroke EXAM: PORTABLE CHEST 1 VIEW COMPARISON:  07/29/2022 FINDINGS: Lungs are clear. No pneumothorax or pleural effusion. Cardiac size mildly enlarged. Pulmonary vascularity is normal. No acute bone abnormality. IMPRESSION: Mild cardiomegaly. Electronically Signed   By: AshFidela SalisburyD.   On: 08/26/2022 02:59   CT HEAD CODE STROKE WO CONTRAST  Result Date: 08/26/2022 CLINICAL DATA:  Code stroke.  Aphasia EXAM: CT HEAD WITHOUT CONTRAST TECHNIQUE: Contiguous axial images were obtained from the base of the skull through the vertex without intravenous contrast. RADIATION DOSE REDUCTION: This exam was performed according to the departmental dose-optimization program which includes automated exposure control, adjustment of the mA and/or kV according to patient size and/or use of iterative reconstruction technique. COMPARISON:  None Available. FINDINGS: Brain: No evidence of acute infarction, hemorrhage, cerebral edema, mass, mass effect, or midline shift. No hydrocephalus or extra-axial collection. Vascular: No hyperdense vessel. Skull: Negative for fracture or focal lesion. Sinuses/Orbits: No acute finding. Other: The mastoid air cells are well aerated. ASPECTS ([facility] Program Early CT Score) - Ganglionic level infarction (caudate, lentiform nuclei, internal capsule, insula, M1-M3 cortex): 7 - Supraganglionic infarction (M4-M6 cortex): 3 Total score (0-10 with 10 being normal): 10 IMPRESSION: 1. No acute intracranial process. 2. ASPECTS is 10 These findings were discussed by telephone on 08/26/2022 at 2:36 am with provider JADE SUNG . Electronically Signed   By: AliMerilyn BabaD.   On: 08/26/2022 02:37    Cardiac Studies   TTE 08/26/2022 1. Left ventricular ejection fraction, by estimation, is 55 to 60%. The  left ventricle has normal function. The left ventricle has no regional  wall motion abnormalities. There is moderate left ventricular hypertrophy.  Left ventricular diastolic function   could  not be evaluated.   2. Right ventricular systolic function is normal. The right ventricular  size is normal. There is normal pulmonary artery systolic pressure.   3. The mitral valve is abnormal. Mild mitral valve regurgitation. No  evidence of mitral stenosis.   4. Tricuspid valve regurgitation is mild to moderate.   5. The aortic valve is tricuspid. There is mild thickening of the aortic  valve. Aortic valve regurgitation is not visualized. Aortic valve  sclerosis is present, with no evidence of aortic valve stenosis.   6. The inferior vena cava is  normal in size with greater than 50%  respiratory variability, suggesting right atrial pressure of 3 mmHg.   Patient Profile     61 y.o. female mitral valve prolapse, anxiety presenting with palpitations, difficulty speaking, diagnosed with acute stroke and new onset atrial fibrillation.  Assessment & Plan    A-fib RVR -Still tachycardic, on Cardizem 10 mg/h -Has appointment with primary cardiologist in 5 days, would really like to go home today. -Start p.o. Cardizem 60 mg 3 times daily, -Consolidate Lopressor to 50 mg twice daily -Titrate off Cardizem drip. -Start Eliquis 5 mg twice daily.  Okayed by neurology. -If heart rates below 110 chief, reasonable for patient to be discharged on current medications, close follow-up with primary cardiologist as outpatient.  2.  CVA -Infarct involving multiple vessel territories -Eliquis, Lipitor  Total encounter time more than 50 minutes  Greater than 50% was spent in counseling and coordination of care with the patient      Signed, Kate Sable, MD  08/27/2022, 1:51 PM

## 2022-08-27 NOTE — Progress Notes (Signed)
Speech Language Pathology Treatment: Cognitive-Linquistic  Patient Details Name: Kathleen Perkins MRN: [id number] DOB: Mar 26, 1961 Today's Date: 08/27/2022 Time: 2010-0712 SLP Time Calculation (min) (ACUTE ONLY): 35 min  Assessment / Plan / Recommendation Clinical Impression  Pt seen for ongoing treatment of aphasia. Pt presents with resolved word finding deficits and phoneme paraphasias. Over 35 minute session, pt's speech was ~ 100% intelligible when discussing unfamiliar information with this clinician. Furthermore, pt had left a voice mail for this writer on Outpatient Rehab's line. This message was intelligible to an unfamiliar listener.   At this time, pt is appropriate for discharge from acute ST services and doesn't need any follow up ST services at discharge. Pt is aware that she can request a referral for Outpatient Services at any time post discharge should she become dissatisfied with her abilities. Pt has writer's contact info.     HPI HPI: Kathleen Perkins is a 61 y.o. female who presents to the ED accompanied by her husband with a chief complaint of possible stroke.  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  She had a beer and chatted with her husband.  They had finished talking approximately 20 minutes before 2 AM when she got ready for bed and husband heard her scream.  CT Angio was No evidence of infarct core or penumbra on CT perfusion. 2.  No intracranial large vessel occlusion or significant stenosis. 3. No hemodynamically significant stenosis in the neck. 4. Interlobular septal thickening in the imaged lungs, possibly mild pulmonary edema.      SLP Plan  All goals met      Recommendations for follow up therapy are one component of a multi-disciplinary discharge planning process, led by the attending physician.  Recommendations may be updated based on patient status, additional functional criteria and insurance authorization.                    Follow Up Recommendations: No SLP follow up Assistance recommended at discharge: None SLP Visit Diagnosis: Aphasia (R47.01) Plan: All goals met        Varnika Butz B. Rutherford Nail, M.S., CCC-SLP, Mining engineer Certified Brain Injury [place]  [place] Office [phone] Ascom [phone] Fax [phone]

## 2022-08-27 NOTE — Consult Note (Signed)
[place] for Electrolyte Monitoring and Replacement   Recent Labs: Potassium (mmol/L)  Date Value  08/27/2022 3.7   Magnesium (mg/dL)  Date Value  08/27/2022 1.8   Calcium (mg/dL)  Date Value  08/27/2022 9.2   Albumin (g/dL)  Date Value  08/26/2022 4.5  11/08/2020 5.0 (H)   Phosphorus (mg/dL)  Date Value  08/27/2022 4.1   Sodium (mmol/L)  Date Value  08/27/2022 135  11/08/2020 134   Assessment: Patient is a 61 y/o F with medical history including anxiety, EtOH use disorder who is admitted with acute CVA of suspected cardioembolic source in setting of Afib. Pharmacy consulted to assist with electrolyte monitoring and replacement as indicated.  Goal of Therapy:  Electrolytes within normal limits  Plan:  --K 3.7, Kcl 40 mEq PO x 1 --Mg 1.8, magnesium sulfate 2 g IV x 1 --Follow-up electrolytes with AM labs tomorrow  Benita Gutter 08/27/2022 7:42 AM

## 2022-08-27 NOTE — TOC Benefit Eligibility Note (Signed)
Patient Teacher, English as a foreign language completed.    The patient is currently admitted and upon discharge could be taking Eliquis 5 mg.  The current 30 day co-pay is $45.00.   The patient is insured through [employer] [place] [employer] [place] Patient Advocate Team Direct Number: [phone]  Fax: [phone]

## 2022-08-28 ENCOUNTER — Ambulatory Visit: Payer: BC Managed Care – PPO | Admitting: Nurse Practitioner

## 2022-08-28 DIAGNOSIS — I4891 Unspecified atrial fibrillation: Secondary | ICD-10-CM | POA: Diagnosis not present

## 2022-08-28 DIAGNOSIS — I10 Essential (primary) hypertension: Secondary | ICD-10-CM

## 2022-08-28 DIAGNOSIS — I160 Hypertensive urgency: Secondary | ICD-10-CM | POA: Diagnosis not present

## 2022-08-28 DIAGNOSIS — R4701 Aphasia: Secondary | ICD-10-CM | POA: Diagnosis not present

## 2022-08-28 DIAGNOSIS — F419 Anxiety disorder, unspecified: Secondary | ICD-10-CM

## 2022-08-28 DIAGNOSIS — F3160 Bipolar disorder, current episode mixed, unspecified: Secondary | ICD-10-CM

## 2022-08-28 DIAGNOSIS — I6340 Cerebral infarction due to embolism of unspecified cerebral artery: Principal | ICD-10-CM

## 2022-08-28 DIAGNOSIS — I639 Cerebral infarction, unspecified: Secondary | ICD-10-CM | POA: Diagnosis not present

## 2022-08-28 LAB — BASIC METABOLIC PANEL
Anion gap: 7 (ref 5–15)
BUN: 13 mg/dL (ref 6–20)
CO2: 27 mmol/L (ref 22–32)
Calcium: 9 mg/dL (ref 8.9–10.3)
Chloride: 104 mmol/L (ref 98–111)
Creatinine, Ser: 0.87 mg/dL (ref 0.44–1.00)
GFR, Estimated: >60 mL/min (ref >60)
Glucose, Bld: 98 mg/dL (ref 70–99)
Potassium: 4.1 mmol/L (ref 3.5–5.1)
Sodium: 138 mmol/L (ref 135–145)

## 2022-08-28 LAB — CBC
HCT: 42.2 % (ref 36.0–46.0)
Hemoglobin: 14.2 g/dL (ref 12.0–15.0)
MCH: 31.1 pg (ref 26.0–34.0)
MCHC: 33.6 g/dL (ref 30.0–36.0)
MCV: 92.3 fL (ref 80.0–100.0)
Platelets: 324 10*3/uL (ref 150–400)
RBC: 4.57 MIL/uL (ref 3.87–5.11)
RDW: 13.4 % (ref 11.5–15.5)
WBC: 8.6 10*3/uL (ref 4.0–10.5)
nRBC: 0 % (ref 0.0–0.2)

## 2022-08-28 LAB — MAGNESIUM: Magnesium: 2.2 mg/dL (ref 1.7–2.4)

## 2022-08-28 LAB — PHOSPHORUS: Phosphorus: 4.5 mg/dL (ref 2.5–4.6)

## 2022-08-28 MED ORDER — MAGNESIUM HYDROXIDE 400 MG/5ML PO SUSP
30.0000 mL | Freq: Once | ORAL | Status: AC
  Administered 2022-08-29: 30 mL via ORAL
  Filled 2022-08-28: qty 30

## 2022-08-28 MED ORDER — DILTIAZEM HCL ER COATED BEADS 300 MG PO CP24
300.0000 mg | ORAL_CAPSULE | Freq: Every day | ORAL | Status: DC
  Administered 2022-08-28 – 2022-08-29 (×2): 300 mg via ORAL
  Filled 2022-08-28 (×2): qty 1

## 2022-08-28 MED ORDER — DIGOXIN 250 MCG PO TABS
0.2500 mg | ORAL_TABLET | Freq: Every day | ORAL | Status: DC
  Administered 2022-08-29: 0.25 mg via ORAL
  Filled 2022-08-28: qty 1

## 2022-08-28 MED ORDER — DIGOXIN 0.25 MG/ML IJ SOLN
0.2500 mg | INTRAMUSCULAR | Status: AC
  Administered 2022-08-28 (×2): 0.25 mg via INTRAVENOUS
  Filled 2022-08-28 (×2): qty 2

## 2022-08-28 NOTE — Progress Notes (Addendum)
  PROGRESS NOTE    Kathleen Perkins  HAL:[id number] DOB: 03/08/1961 DOA: 08/26/2022 PCP: Virginia Crews, MD  IC20A/IC20A-AA  LOS: 2 days   Brief hospital course:   Assessment & Plan: 61 yo woman with hx MVP, HL, palpitations who presented after acute onset of dysarthria and aphasia, s/p TNK administered at 0300 on 9/26. She has been in a fib since arrival which is a new dx for her. MRI shows multiple small infarcts in multiple vascular distributions c/w central embolic source in the setting of A fib. Hospital stay has been complicated by a fib w/ RVR requiring cardizem gtt. Cardiology following. Head CT overnight at 24 hrs post-TNK showed no e/o hemorrhage therefore eliquis started for secondary stroke prevention in the setting of a fib.   Embolic strokes --cont Eliquis - No neuro indication for antiplatelets in addition to therapeutic anticoagulation --outpatient f/u with neurology  Afib w RVR -Presenting to the hospital in normal sinus rhythm on EKG, converted to Afib shortly after admission.  Cardio consulted. -No immediate plans for TEE cardioversion given recent strokes Rate has worsened after transition from diltiazem infusion to Lopressor 50 twice daily and Cardizem 60 3 times daily Plan: --add digoxin 0.25 IV every 4 hours x2 doses then 0.25 p.o. daily --transition diltiazem to extended release 300 --Continue metoprolol tartrate if blood pressure tolerates   Hypertensive Urgency  --cont dilt and Lopressor  Hyperlipidemia - cont 40 mg atorvastatin daily (new)   Mild Hypokalemia --monitor and replete PRN  Bipolar disorder Anxiety - continue outpatient Lamictal & hydroxyzine PRN for anxiety  Alcohol use --monitor on CIWA  Hypokalemia --monitor and replete PRN   DVT prophylaxis: BD:ZHGDJME Code Status: Full code  Family Communication:  Level of care: Progressive Dispo:   The patient is from: home Anticipated d/c is to: home Anticipated d/c date is:  tomorrow   Subjective and Interval History:  Pt reported doing well, ready to go home, however, remained in Afib w RVR.   Objective: Vitals:   08/28/22 1500 08/28/22 1600 08/28/22 1652 08/28/22 1700  BP:   (!) 151/95   Pulse:      Resp: 18 17 (!) 21 19  Temp:      TempSrc:      SpO2:      Weight:       No intake or output data in the 24 hours ending 08/28/22 1747 Filed Weights   08/26/22 0255 08/27/22 0140 08/28/22 0500  Weight: 84.5 kg 84.3 kg 81.6 kg    Examination:   Constitutional: NAD, AAOx3 HEENT: conjunctivae and lids normal, EOMI CV: No cyanosis.   RESP: normal respiratory effort, on RA Neuro: II - XII grossly intact.   Psych: Normal mood and affect.  Appropriate judgement and reason   Data Reviewed: I have personally reviewed labs and imaging studies  Time spent: 50 minutes  Enzo Bi, MD [facility] If 7PM-7AM, please contact night-coverage 08/28/2022, 5:47 PM

## 2022-08-28 NOTE — Progress Notes (Signed)
Rounding Note    Patient Name: Kathleen Perkins Date of Encounter: 08/28/2022  [place] Cardiologist: Mertie Moores, MD   Subjective   She reports her neurologic issues have resolved Remains in atrial fibrillation with rate 110 up to 130 bpm at rest Was transitioned off diltiazem infusion yesterday onto oral medication metoprolol tartrate 50 twice daily diltiazem 60 3 times daily Just received morning medication Blood pressure 120/100 on rounds She reports that she is asymptomatic from her atrial fibrillation Does not know when she converted to A-fib Presenting EKG showing normal sinus rhythm, converted to atrial fibrillation date of admission On Eliquis 5 twice daily  Inpatient Medications    Scheduled Meds:  apixaban  5 mg Oral BID   atorvastatin  40 mg Oral Daily   Chlorhexidine Gluconate Cloth  6 each Topical Daily   digoxin  0.25 mg Intravenous Q4H   [START ON 08/29/2022] digoxin  0.25 mg Oral Daily   diltiazem  300 mg Oral Daily   folic acid  1 mg Oral Daily   lamoTRIgine  100 mg Oral BID   LORazepam  0-4 mg Oral Q4H   Followed by   Derrill Memo ON 08/29/2022] LORazepam  0-4 mg Oral Q8H   metoprolol tartrate  50 mg Oral BID   multivitamin with minerals  1 tablet Oral Daily   pantoprazole  40 mg Oral Daily   thiamine  100 mg Oral Daily   Continuous Infusions:  PRN Meds: acetaminophen, docusate sodium, hydrOXYzine, LORazepam **OR** LORazepam, metoprolol tartrate, ondansetron (ZOFRAN) IV, mouth rinse, polyethylene glycol, propranolol   Vital Signs    Vitals:   08/28/22 0600 08/28/22 0700 08/28/22 0800 08/28/22 0900  BP:   (!) 123/94   Pulse: (!) 141 (!) 112 (!) 115 (!) 112  Resp: 16     Temp:   98.4 F (36.9 C)   TempSrc:   Oral   SpO2: 96% 96% 94% 96%  Weight:        Intake/Output Summary (Last 24 hours) at 08/28/2022 1144 Last data filed at 08/27/2022 1600 Gross per 24 hour  Intake 294.06 ml  Output --  Net 294.06 ml      08/28/2022    5:00  AM 08/27/2022    1:40 AM 08/26/2022    2:55 AM  Last 3 Weights  Weight (lbs) 179 lb 14.3 oz 185 lb 13.6 oz 186 lb 4.6 oz  Weight (kg) 81.6 kg 84.3 kg 84.5 kg      Telemetry    Atrial fibrillation rate 110 up to 130- Personally Reviewed  ECG     - Personally Reviewed  Physical Exam   GEN: No acute distress.   Neck: No JVD Cardiac: Irregularly irregular, rapid, no murmurs, rubs, or gallops.  Respiratory: Clear to auscultation bilaterally. GI: Soft, nontender, non-distended  MS: No edema; No deformity. Neuro:  Nonfocal  Psych: Normal affect   Labs    High Sensitivity Troponin:   Recent Labs  Lab 07/29/22 1220 08/26/22 0236 08/26/22 0409  TROPONINIHS '6 7 15     '$ Chemistry Recent Labs  Lab 08/26/22 0409 08/27/22 0426 08/28/22 0550  NA 132* 135 138  K 3.4* 3.7 4.1  CL 98 101 104  CO2 '24 24 27  '$ GLUCOSE 124* 114* 98  BUN '16 10 13  '$ CREATININE 0.92 0.68 0.87  CALCIUM 8.9 9.2 9.0  MG 2.1 1.8 2.2  PROT 7.8  --   --   ALBUMIN 4.5  --   --  AST 23  --   --   ALT 19  --   --   ALKPHOS 77  --   --   BILITOT 0.7  --   --   GFRNONAA >60 >60 >60  ANIONGAP '10 10 7    '$ Lipids  Recent Labs  Lab 08/26/22 0409  CHOL 223*  TRIG 117  HDL 87  LDLCALC 113*  CHOLHDL 2.6    Hematology Recent Labs  Lab 08/26/22 0236 08/27/22 0426 08/28/22 0550  WBC 9.7 12.0* 8.6  RBC 4.43 4.49 4.57  HGB 13.4 13.5 14.2  HCT 40.8 40.7 42.2  MCV 92.1 90.6 92.3  MCH 30.2 30.1 31.1  MCHC 32.8 33.2 33.6  RDW 13.2 13.1 13.4  PLT 381 342 324   Thyroid  Recent Labs  Lab 08/26/22 1250  TSH 0.443    BNP Recent Labs  Lab 08/26/22 0236  BNP 327.3*    DDimer  Recent Labs  Lab 08/26/22 1250  DDIMER 3.75*     Radiology    ECHOCARDIOGRAM COMPLETE  Result Date: 08/27/2022    ECHOCARDIOGRAM REPORT   Patient Name:   Kathleen Perkins Date of Exam: 08/26/2022 Medical Rec #:  [id number]      Height:       64.0 in Accession #:    [id number]     Weight:       186.3 lb Date of Birth:   01/11/1961     BSA:          1.898 m Patient Age:    61 years       BP:           139/87 mmHg Patient Gender: F              HR:           122 bpm. Exam Location:  [facility] Procedure: 2D Echo, Cardiac Doppler and Color Doppler Indications:     I63.9 Stroke  History:         Patient has no prior history of Echocardiogram examinations.                  Mitral Valve Prolapse.  Sonographer:     Cresenciano Lick RDCS Referring Phys:  [id number] BRITTON L R[place] Diagnosing Phys: Harrell Gave End MD IMPRESSIONS  1. Left ventricular ejection fraction, by estimation, is 55 to 60%. The left ventricle has normal function. The left ventricle has no regional wall motion abnormalities. There is moderate left ventricular hypertrophy. Left ventricular diastolic function  could not be evaluated.  2. Right ventricular systolic function is normal. The right ventricular size is normal. There is normal pulmonary artery systolic pressure.  3. The mitral valve is abnormal. Mild mitral valve regurgitation. No evidence of mitral stenosis.  4. Tricuspid valve regurgitation is mild to moderate.  5. The aortic valve is tricuspid. There is mild thickening of the aortic valve. Aortic valve regurgitation is not visualized. Aortic valve sclerosis is present, with no evidence of aortic valve stenosis.  6. The inferior vena cava is normal in size with greater than 50% respiratory variability, suggesting right atrial pressure of 3 mmHg. FINDINGS  Left Ventricle: Left ventricular ejection fraction, by estimation, is 55 to 60%. The left ventricle has normal function. The left ventricle has no regional wall motion abnormalities. The left ventricular internal cavity size was normal in size. There is  moderate left ventricular hypertrophy. Left ventricular diastolic function could not be evaluated due to atrial fibrillation. Left ventricular  diastolic function could not be evaluated. Right Ventricle: The right ventricular size is normal. No increase  in right ventricular wall thickness. Right ventricular systolic function is normal. There is normal pulmonary artery systolic pressure. The tricuspid regurgitant velocity is 2.65 m/s, and  with an assumed right atrial pressure of 3 mmHg, the estimated right ventricular systolic pressure is 70.1 mmHg. Left Atrium: Left atrial size was normal in size. Right Atrium: Right atrial size was normal in size. Pericardium: There is no evidence of pericardial effusion. Mitral Valve: The mitral valve is abnormal. Mild to moderate mitral annular calcification. Mild mitral valve regurgitation. No evidence of mitral valve stenosis. Tricuspid Valve: The tricuspid valve is normal in structure. Tricuspid valve regurgitation is mild to moderate. Aortic Valve: The aortic valve is tricuspid. There is mild thickening of the aortic valve. Aortic valve regurgitation is not visualized. Aortic valve sclerosis is present, with no evidence of aortic valve stenosis. Aortic valve mean gradient measures 4.1  mmHg. Aortic valve peak gradient measures 7.0 mmHg. Aortic valve area, by VTI measures 3.06 cm. Pulmonic Valve: The pulmonic valve was not well visualized. Pulmonic valve regurgitation is not visualized. No evidence of pulmonic stenosis. Aorta: The aortic root and ascending aorta are structurally normal, with no evidence of dilitation. Pulmonary Artery: The pulmonary artery is of normal size. Venous: The inferior vena cava is normal in size with greater than 50% respiratory variability, suggesting right atrial pressure of 3 mmHg. IAS/Shunts: The interatrial septum was not well visualized.  LEFT VENTRICLE PLAX 2D LVIDd:         3.70 cm LVIDs:         2.50 cm LV PW:         1.40 cm LV IVS:        1.40 cm LVOT diam:     2.10 cm LV SV:         73 LV SV Index:   38 LVOT Area:     3.46 cm  RIGHT VENTRICLE             IVC RV Basal diam:  3.50 cm     IVC diam: 1.50 cm RV S prime:     14.53 cm/s TAPSE (M-mode): 2.1 cm LEFT ATRIUM             Index         RIGHT ATRIUM           Index LA diam:        4.60 cm 2.42 cm/m   RA Area:     15.00 cm LA Vol (A2C):   70.9 ml 37.35 ml/m  RA Volume:   36.50 ml  19.23 ml/m LA Vol (A4C):   39.2 ml 20.65 ml/m LA Biplane Vol: 56.7 ml 29.87 ml/m  AORTIC VALVE AV Area (Vmax):    3.06 cm AV Area (Vmean):   2.77 cm AV Area (VTI):     3.06 cm AV Vmax:           132.18 cm/s AV Vmean:          96.406 cm/s AV VTI:            0.238 m AV Peak Grad:      7.0 mmHg AV Mean Grad:      4.1 mmHg LVOT Vmax:         116.67 cm/s LVOT Vmean:        77.167 cm/s LVOT VTI:  0.210 m LVOT/AV VTI ratio: 0.88  AORTA Ao Root diam: 3.10 cm Ao Asc diam:  3.30 cm MV E velocity: 145.67 cm/s  TRICUSPID VALVE                             TR Peak grad:   28.1 mmHg                             TR Vmax:        265.00 cm/s                              SHUNTS                             Systemic VTI:  0.21 m                             Systemic Diam: 2.10 cm Nelva Bush MD Electronically signed by Nelva Bush MD Signature Date/Time: 08/27/2022/8:14:44 AM    Final    US Venous Img Lower Bilateral (DVT)  Result Date: 08/27/2022 CLINICAL DATA:  Bilateral lower extremity edema. History of melanoma. Evaluate for DVT. EXAM: BILATERAL LOWER EXTREMITY VENOUS DOPPLER ULTRASOUND TECHNIQUE: Gray-scale sonography with graded compression, as well as color Doppler and duplex ultrasound were performed to evaluate the lower extremity deep venous systems from the level of the common femoral vein and including the common femoral, femoral, profunda femoral, popliteal and calf veins including the posterior tibial, peroneal and gastrocnemius veins when visible. The superficial great saphenous vein was also interrogated. Spectral Doppler was utilized to evaluate flow at rest and with distal augmentation maneuvers in the common femoral, femoral and popliteal veins. COMPARISON:  None Available. FINDINGS: RIGHT LOWER EXTREMITY Common Femoral Vein: No evidence of  thrombus. Normal compressibility, respiratory phasicity and response to augmentation. Saphenofemoral Junction: No evidence of thrombus. Normal compressibility and flow on color Doppler imaging. Profunda Femoral Vein: No evidence of thrombus. Normal compressibility and flow on color Doppler imaging. Femoral Vein: No evidence of thrombus. Normal compressibility, respiratory phasicity and response to augmentation. Popliteal Vein: No evidence of thrombus. Normal compressibility, respiratory phasicity and response to augmentation. Calf Veins: No evidence of thrombus. Normal compressibility and flow on color Doppler imaging. Superficial Great Saphenous Vein: No evidence of thrombus. Normal compressibility. Other Findings:  None. LEFT LOWER EXTREMITY Common Femoral Vein: No evidence of thrombus. Normal compressibility, respiratory phasicity and response to augmentation. Saphenofemoral Junction: No evidence of thrombus. Normal compressibility and flow on color Doppler imaging. Profunda Femoral Vein: No evidence of thrombus. Normal compressibility and flow on color Doppler imaging. Femoral Vein: No evidence of thrombus. Normal compressibility, respiratory phasicity and response to augmentation. Popliteal Vein: No evidence of thrombus. Normal compressibility, respiratory phasicity and response to augmentation. Calf Veins: No evidence of thrombus. Normal compressibility and flow on color Doppler imaging. Superficial Great Saphenous Vein: No evidence of thrombus. Normal compressibility. Other Findings:  None. IMPRESSION: No evidence of DVT within either lower extremity. Electronically Signed   By: Sandi Mariscal M.D.   On: 08/27/2022 07:54   CT HEAD WO CONTRAST  Result Date: 08/27/2022 CLINICAL DATA:  Stroke follow-up EXAM: CT HEAD WITHOUT CONTRAST TECHNIQUE: Contiguous axial images were obtained from the base of the skull through the vertex  without intravenous contrast. RADIATION DOSE REDUCTION: This exam was performed  according to the departmental dose-optimization program which includes automated exposure control, adjustment of the mA and/or kV according to patient size and/or use of iterative reconstruction technique. COMPARISON:  MRI head 08/26/2022 and CT head 08/26/2022 FINDINGS: Brain: No intracranial hemorrhage, mass effect, or evidence of acute infarct. The small infarcts seen on MRI 08/26/2022 are not well demonstrated on CT. No hydrocephalus. No extra-axial fluid collection. Generalized cerebral atrophy. Ill-defined hypoattenuation within the cerebral white matter is nonspecific but consistent with chronic small vessel ischemic disease. Vascular: No hyperdense vessel. Intracranial arterial calcification. Skull: No fracture or focal lesion. Sinuses/Orbits: No acute finding. Paranasal sinuses and mastoid air cells are well aerated. Other: None. IMPRESSION: No acute intracranial abnormality on CT. The small infarcts on MRI 08/26/2022 are not well visualized. No hemorrhagic transformation or mass effect. Electronically Signed   By: Placido Sou M.D.   On: 08/27/2022 02:49   MR BRAIN WO CONTRAST  Result Date: 08/26/2022 CLINICAL DATA:  Provided history: Neuro deficit, acute, stroke suspected. EXAM: MRI HEAD WITHOUT CONTRAST TECHNIQUE: Multiplanar, multiecho pulse sequences of the brain and surrounding structures were obtained without intravenous contrast. COMPARISON:  Non-contrast head CT and CT angiogram head/neck 08/26/2022. FINDINGS: Brain: No age advanced or lobar predominant cerebral atrophy. Cerebellar atrophy. Punctate acute cortical infarct within the posterior left insula (series 5, image 24). 4 mm acute cortical infarct within the left precentral gyrus (series 5, image 38). 6 mm acute infarct within the right temporal occipital lobe periventricular white matter (series 5, image 20). Multifocal T2 FLAIR hyperintense signal abnormality within the cerebral white matter, nonspecific but compatible with mild  chronic small vessel ischemic disease. No evidence of an intracranial mass. No chronic intracranial blood products. No extra-axial fluid collection. No midline shift. Vascular: Maintained flow voids within the proximal large arterial vessels. Skull and upper cervical spine: No focal suspicious marrow lesion. Sinuses/Orbits: No mass or acute finding within the imaged orbits. No significant paranasal sinus disease. Other: Trace fluid within the left mastoid air cells. IMPRESSION: 4 mm acute cortical infarct within the left precentral gyrus. Punctate acute cortical infarct within the posterior left insula. 6 mm acute infarct within the right temporo-occipital lobe periventricular white matter. Given the involvement of multiple vascular territories, consider an embolic process. Mild chronic small vessel ischemic changes within the cerebral white matter. Cerebellar atrophy. Electronically Signed   By: Kellie Simmering D.O.   On: 08/26/2022 14:55    Cardiac Studies   Echocardiogram August 26, 2022 Left ventricular ejection fraction, by estimation, is 55 to 60%.   Patient Profile     61 y.o. female mitral valve prolapse, anxiety presenting with palpitations, difficulty speaking, diagnosed with acute stroke and new onset atrial fibrillation.  Assessment & Plan    Atrial fibrillation with RVR -Presenting to the hospital in normal sinus rhythm on EKG, converting to a fibrillation shortly after admission -No immediate plans for TEE cardioversion given recent strokes Rate has worsened after transition from diltiazem infusion to Lopressor 50 twice daily and Cardizem 60 3 times daily -We will recommend adding digoxin 0.25 IV every 4 hours x2 doses then 0.25 p.o. daily -We will transition her diltiazem to extended release 300 at noon Continue metoprolol tartrate if blood pressure tolerates  Stroke Received tenecteplase IV on arrival to the ER Multiple areas concerning for embolic, likely from atrial  fibrillation On Eliquis, rate control as above Statin therapy  Anxiety/bipolar Alcohol cessation recommended On Lamictal at  home  Discussed recent events, discussed options for managing her atrial fibrillation, importance of rate control  Total encounter time more than 50 minutes  Greater than 50% was spent in counseling and coordination of care with the patient   For questions or updates, please contact [place] Please consult [email or web address] for contact info under        Signed, Ida Rogue, MD  08/28/2022, 11:44 AM

## 2022-08-29 ENCOUNTER — Inpatient Hospital Stay: Payer: Self-pay | Admitting: Family Medicine

## 2022-08-29 DIAGNOSIS — I6340 Cerebral infarction due to embolism of unspecified cerebral artery: Secondary | ICD-10-CM | POA: Diagnosis not present

## 2022-08-29 DIAGNOSIS — I4891 Unspecified atrial fibrillation: Secondary | ICD-10-CM | POA: Diagnosis not present

## 2022-08-29 LAB — BASIC METABOLIC PANEL
Anion gap: 8 (ref 5–15)
BUN: 12 mg/dL (ref 6–20)
CO2: 27 mmol/L (ref 22–32)
Calcium: 8.8 mg/dL — ABNORMAL LOW (ref 8.9–10.3)
Chloride: 102 mmol/L (ref 98–111)
Creatinine, Ser: 0.86 mg/dL (ref 0.44–1.00)
GFR, Estimated: >60 mL/min (ref >60)
Glucose, Bld: 96 mg/dL (ref 70–99)
Potassium: 3.8 mmol/L (ref 3.5–5.1)
Sodium: 137 mmol/L (ref 135–145)

## 2022-08-29 LAB — CBC
HCT: 43.3 % (ref 36.0–46.0)
Hemoglobin: 14.6 g/dL (ref 12.0–15.0)
MCH: 30.7 pg (ref 26.0–34.0)
MCHC: 33.7 g/dL (ref 30.0–36.0)
MCV: 91 fL (ref 80.0–100.0)
Platelets: 364 10*3/uL (ref 150–400)
RBC: 4.76 MIL/uL (ref 3.87–5.11)
RDW: 13.2 % (ref 11.5–15.5)
WBC: 7.9 10*3/uL (ref 4.0–10.5)
nRBC: 0 % (ref 0.0–0.2)

## 2022-08-29 LAB — MAGNESIUM: Magnesium: 2.5 mg/dL — ABNORMAL HIGH (ref 1.7–2.4)

## 2022-08-29 MED ORDER — FOLIC ACID 1 MG PO TABS: 1.0000 mg | ORAL_TABLET | Freq: Every day | ORAL | Status: DC

## 2022-08-29 MED ORDER — VITAMIN B-1 100 MG PO TABS: 100.0000 mg | ORAL_TABLET | Freq: Every day | ORAL | Status: AC

## 2022-08-29 MED ORDER — DILTIAZEM HCL ER COATED BEADS 240 MG PO CP24: 240.0000 mg | ORAL_CAPSULE | Freq: Every day | ORAL | 2 refills | Status: DC

## 2022-08-29 MED ORDER — METOPROLOL TARTRATE 50 MG PO TABS: 50.0000 mg | ORAL_TABLET | Freq: Two times a day (BID) | ORAL | 2 refills | Status: DC

## 2022-08-29 MED ORDER — DIGOXIN 125 MCG PO TABS: 0.1250 mg | ORAL_TABLET | Freq: Every day | ORAL | Status: DC

## 2022-08-29 MED ORDER — ATORVASTATIN CALCIUM 40 MG PO TABS: 40.0000 mg | ORAL_TABLET | Freq: Every day | ORAL | 2 refills | Status: DC

## 2022-08-29 MED ORDER — DIGOXIN 125 MCG PO TABS: 0.1250 mg | ORAL_TABLET | Freq: Every day | ORAL | 2 refills | Status: DC

## 2022-08-29 MED ORDER — DILTIAZEM HCL ER COATED BEADS 240 MG PO CP24: 240.0000 mg | ORAL_CAPSULE | Freq: Every day | ORAL | Status: DC

## 2022-08-29 MED ORDER — APIXABAN 5 MG PO TABS: 5.0000 mg | ORAL_TABLET | Freq: Two times a day (BID) | ORAL | 2 refills | Status: DC

## 2022-08-29 NOTE — Progress Notes (Signed)
Pt. Discharged home with her husband. Education provided and all questions answered. V/S are stable and pt. Has no complaints at this time.

## 2022-08-29 NOTE — Discharge Summary (Signed)
Physician Discharge Summary   Kathleen Perkins  female DOB: July 27, 1961  OIN:[id number]  PCP: Virginia Crews, MD  Admit date: 08/26/2022 Discharge date: 08/29/2022  Admitted From: home Disposition:  home CODE STATUS: Full code  Discharge Instructions     Ambulatory referral to Neurology   Complete by: As directed    An appointment is requested in approximately: 6 wks   Diet - low sodium heart healthy   Complete by: As directed    Discharge instructions   Complete by: As directed    Outpatient followup with Dr. Acie Fredrickson on October 9 at 11:40 AM [facility] Course:  For full details, please see H&P, progress notes, consult notes and ancillary notes.  Briefly,  Kathleen Perkins is a 61 yo woman with hx MVP, HL, palpitations who presented after acute onset of dysarthria and aphasia, s/p TNK administered at 0300 on 9/26. She has been in a fib since arrival which is a new dx for her.  MRI shows multiple small infarcts in multiple vascular distributions c/w central embolic source in the setting of A fib. Hospital stay has been complicated by a fib w/ RVR requiring cardizem gtt. Cardiology following. Head CT overnight at 24 hrs post-TNK showed no e/o hemorrhage therefore eliquis started for secondary stroke prevention in the setting of a fib.   Embolic strokes --cont Eliquis (started new here). - No neuro indication for antiplatelets in addition to therapeutic anticoagulation --outpatient f/u with neurology   Afib w RVR -Presenting to the hospital in normal sinus rhythm on EKG, converted to Afib shortly after admission.  Cardio consulted. -No immediate plans for TEE cardioversion given recent strokes. --discharged on diltiazem 240 mg once daily, metoprolol 50 mg twice daily and digoxin 0.125 mg once daily. --cont Eliquis (started new here).   Hypertensive Urgency, resolved --cont dilt and Lopressor (new) --d/c home HCTZ, atenolol and propranolol    Hyperlipidemia -  cont 40 mg atorvastatin daily (new)   Mild Hypokalemia --monitored and repleted PRN   Bipolar disorder Anxiety - continue outpatient Lamictal & hydroxyzine PRN for anxiety   Alcohol use --monitored on CIWA    Unless noted above, medications under "STOP" list are ones pt was not taking PTA.  Discharge Diagnoses:  Principal Problem:   Cerebrovascular accident (CVA) ([place]) Active Problems:   Anxiety   Atrial fibrillation with RVR ([place])   Aphasia   Hypertensive urgency   Bipolar 1 disorder, mixed ([place])   30 Day Unplanned Readmission Risk Score    Flowsheet Row ED to Hosp-Admission (Current) from 08/26/2022 in [place] ICU/CCU  30 Day Unplanned Readmission Risk Score (%) 13.5 Filed at 08/29/2022 0801       This score is the patient's risk of an unplanned readmission within 30 days of being discharged (0 -100%). The score is based on dignosis, age, lab data, medications, orders, and past utilization.   Low:  0-14.9   Medium: 15-21.9   High: 22-29.9   Extreme: 30 and above         Discharge Instructions:  Allergies as of 08/29/2022       Reactions   Ivp Dye [iodinated Contrast Media] Shortness Of Breath   Patient had wheezing and hypoxia after returning from IV dye CT   Codeine Nausea Only   Erythromycin Nausea Only   Nsaids    Ibuprofen Nausea Only        Medication List     STOP  taking these medications    amoxicillin 500 MG capsule Commonly known as: AMOXIL   atenolol 25 MG tablet Commonly known as: TENORMIN   clonazePAM 0.5 MG tablet Commonly known as: KLONOPIN   hydrochlorothiazide 12.5 MG tablet Commonly known as: HYDRODIURIL   ondansetron 4 MG tablet Commonly known as: Zofran   propranolol 10 MG tablet Commonly known as: INDERAL       TAKE these medications    apixaban 5 MG Tabs tablet Commonly known as: ELIQUIS Take 1 tablet (5 mg total) by mouth 2 (two) times daily.   atorvastatin 40 MG tablet Commonly  known as: LIPITOR Take 1 tablet (40 mg total) by mouth daily.   digoxin 0.125 MG tablet Commonly known as: LANOXIN Take 1 tablet (0.125 mg total) by mouth daily. Start taking on: August 30, 2022   diltiazem 240 MG 24 hr capsule Commonly known as: CARDIZEM CD Take 1 capsule (240 mg total) by mouth daily. Start taking on: August 30, 4400   folic acid 1 MG tablet Commonly known as: FOLVITE Take 1 tablet (1 mg total) by mouth daily.   hydrOXYzine 10 MG tablet Commonly known as: ATARAX TAKE ONE TABLET BY MOUTH THREE TIMES A DAY AS NEEDED   lamoTRIgine 100 MG tablet Commonly known as: LAMICTAL TAKE ONE TABLET BY MOUTH TWICE A DAY What changed: Another medication with the same name was removed. Continue taking this medication, and follow the directions you see here.   metoprolol tartrate 50 MG tablet Commonly known as: LOPRESSOR Take 1 tablet (50 mg total) by mouth 2 (two) times daily.   thiamine 100 MG tablet Commonly known as: Vitamin B-1 Take 1 tablet (100 mg total) by mouth daily.   traZODone 100 MG tablet Commonly known as: DESYREL Take 1 tablet (100 mg total) by mouth at bedtime as needed for sleep. Please schedule office visit before any future refill.         Follow-up Information     Virginia Crews, MD Follow up in 1 week(s).   Specialty: Family Medicine Contact information: [address] [place] [address] [phone]         Nahser, Wonda Cheng, MD Follow up on 09/08/2022.   Specialty: Cardiology Why: at 11:40 AM Contact information: [address] [place] [address] [phone]                 Allergies  Allergen Reactions   Ivp Dye [Iodinated Contrast Media] Shortness Of Breath    Patient had wheezing and hypoxia after returning from IV dye CT   Codeine Nausea Only   Erythromycin Nausea Only   Nsaids    Ibuprofen Nausea Only     The results of significant diagnostics from this  hospitalization (including imaging, microbiology, ancillary and laboratory) are listed below for reference.   Consultations:   Procedures/Studies: ECHOCARDIOGRAM COMPLETE  Result Date: 08/27/2022    ECHOCARDIOGRAM REPORT   Patient Name:   Kathleen Perkins Date of Exam: 08/26/2022 Medical Rec #:  [id number]      Height:       64.0 in Accession #:    [id number]     Weight:       186.3 lb Date of Birth:  August 29, 1961     BSA:          1.898 m Patient Age:    61 years       BP:  139/87 mmHg Patient Gender: F              HR:           122 bpm. Exam Location:  [facility] Procedure: 2D Echo, Cardiac Doppler and Color Doppler Indications:     I63.9 Stroke  History:         Patient has no prior history of Echocardiogram examinations.                  Mitral Valve Prolapse.  Sonographer:     Cresenciano Lick RDCS Referring Phys:  [id number] BRITTON L R[place]STER Diagnosing Phys: Harrell Gave End MD IMPRESSIONS  1. Left ventricular ejection fraction, by estimation, is 55 to 60%. The left ventricle has normal function. The left ventricle has no regional wall motion abnormalities. There is moderate left ventricular hypertrophy. Left ventricular diastolic function  could not be evaluated.  2. Right ventricular systolic function is normal. The right ventricular size is normal. There is normal pulmonary artery systolic pressure.  3. The mitral valve is abnormal. Mild mitral valve regurgitation. No evidence of mitral stenosis.  4. Tricuspid valve regurgitation is mild to moderate.  5. The aortic valve is tricuspid. There is mild thickening of the aortic valve. Aortic valve regurgitation is not visualized. Aortic valve sclerosis is present, with no evidence of aortic valve stenosis.  6. The inferior vena cava is normal in size with greater than 50% respiratory variability, suggesting right atrial pressure of 3 mmHg. FINDINGS  Left Ventricle: Left ventricular ejection fraction, by estimation, is 55 to 60%. The left ventricle  has normal function. The left ventricle has no regional wall motion abnormalities. The left ventricular internal cavity size was normal in size. There is  moderate left ventricular hypertrophy. Left ventricular diastolic function could not be evaluated due to atrial fibrillation. Left ventricular diastolic function could not be evaluated. Right Ventricle: The right ventricular size is normal. No increase in right ventricular wall thickness. Right ventricular systolic function is normal. There is normal pulmonary artery systolic pressure. The tricuspid regurgitant velocity is 2.65 m/s, and  with an assumed right atrial pressure of 3 mmHg, the estimated right ventricular systolic pressure is 45.4 mmHg. Left Atrium: Left atrial size was normal in size. Right Atrium: Right atrial size was normal in size. Pericardium: There is no evidence of pericardial effusion. Mitral Valve: The mitral valve is abnormal. Mild to moderate mitral annular calcification. Mild mitral valve regurgitation. No evidence of mitral valve stenosis. Tricuspid Valve: The tricuspid valve is normal in structure. Tricuspid valve regurgitation is mild to moderate. Aortic Valve: The aortic valve is tricuspid. There is mild thickening of the aortic valve. Aortic valve regurgitation is not visualized. Aortic valve sclerosis is present, with no evidence of aortic valve stenosis. Aortic valve mean gradient measures 4.1  mmHg. Aortic valve peak gradient measures 7.0 mmHg. Aortic valve area, by VTI measures 3.06 cm. Pulmonic Valve: The pulmonic valve was not well visualized. Pulmonic valve regurgitation is not visualized. No evidence of pulmonic stenosis. Aorta: The aortic root and ascending aorta are structurally normal, with no evidence of dilitation. Pulmonary Artery: The pulmonary artery is of normal size. Venous: The inferior vena cava is normal in size with greater than 50% respiratory variability, suggesting right atrial pressure of 3 mmHg. IAS/Shunts:  The interatrial septum was not well visualized.  LEFT VENTRICLE PLAX 2D LVIDd:         3.70 cm LVIDs:         2.50 cm LV  PW:         1.40 cm LV IVS:        1.40 cm LVOT diam:     2.10 cm LV SV:         73 LV SV Index:   38 LVOT Area:     3.46 cm  RIGHT VENTRICLE             IVC RV Basal diam:  3.50 cm     IVC diam: 1.50 cm RV S prime:     14.53 cm/s TAPSE (M-mode): 2.1 cm LEFT ATRIUM             Index        RIGHT ATRIUM           Index LA diam:        4.60 cm 2.42 cm/m   RA Area:     15.00 cm LA Vol (A2C):   70.9 ml 37.35 ml/m  RA Volume:   36.50 ml  19.23 ml/m LA Vol (A4C):   39.2 ml 20.65 ml/m LA Biplane Vol: 56.7 ml 29.87 ml/m  AORTIC VALVE AV Area (Vmax):    3.06 cm AV Area (Vmean):   2.77 cm AV Area (VTI):     3.06 cm AV Vmax:           132.18 cm/s AV Vmean:          96.406 cm/s AV VTI:            0.238 m AV Peak Grad:      7.0 mmHg AV Mean Grad:      4.1 mmHg LVOT Vmax:         116.67 cm/s LVOT Vmean:        77.167 cm/s LVOT VTI:          0.210 m LVOT/AV VTI ratio: 0.88  AORTA Ao Root diam: 3.10 cm Ao Asc diam:  3.30 cm MV E velocity: 145.67 cm/s  TRICUSPID VALVE                             TR Peak grad:   28.1 mmHg                             TR Vmax:        265.00 cm/s                              SHUNTS                             Systemic VTI:  0.21 m                             Systemic Diam: 2.10 cm Nelva Bush MD Electronically signed by Nelva Bush MD Signature Date/Time: 08/27/2022/8:14:44 AM    Final    US Venous Img Lower Bilateral (DVT)  Result Date: 08/27/2022 CLINICAL DATA:  Bilateral lower extremity edema. History of melanoma. Evaluate for DVT. EXAM: BILATERAL LOWER EXTREMITY VENOUS DOPPLER ULTRASOUND TECHNIQUE: Gray-scale sonography with graded compression, as well as color Doppler and duplex ultrasound were performed to evaluate the lower extremity deep venous systems from the level of the common femoral vein and including the common femoral, femoral, profunda femoral,  popliteal and calf veins including the posterior tibial, peroneal and gastrocnemius veins when visible. The superficial great saphenous vein was also interrogated. Spectral Doppler was utilized to evaluate flow at rest and with distal augmentation maneuvers in the common femoral, femoral and popliteal veins. COMPARISON:  None Available. FINDINGS: RIGHT LOWER EXTREMITY Common Femoral Vein: No evidence of thrombus. Normal compressibility, respiratory phasicity and response to augmentation. Saphenofemoral Junction: No evidence of thrombus. Normal compressibility and flow on color Doppler imaging. Profunda Femoral Vein: No evidence of thrombus. Normal compressibility and flow on color Doppler imaging. Femoral Vein: No evidence of thrombus. Normal compressibility, respiratory phasicity and response to augmentation. Popliteal Vein: No evidence of thrombus. Normal compressibility, respiratory phasicity and response to augmentation. Calf Veins: No evidence of thrombus. Normal compressibility and flow on color Doppler imaging. Superficial Great Saphenous Vein: No evidence of thrombus. Normal compressibility. Other Findings:  None. LEFT LOWER EXTREMITY Common Femoral Vein: No evidence of thrombus. Normal compressibility, respiratory phasicity and response to augmentation. Saphenofemoral Junction: No evidence of thrombus. Normal compressibility and flow on color Doppler imaging. Profunda Femoral Vein: No evidence of thrombus. Normal compressibility and flow on color Doppler imaging. Femoral Vein: No evidence of thrombus. Normal compressibility, respiratory phasicity and response to augmentation. Popliteal Vein: No evidence of thrombus. Normal compressibility, respiratory phasicity and response to augmentation. Calf Veins: No evidence of thrombus. Normal compressibility and flow on color Doppler imaging. Superficial Great Saphenous Vein: No evidence of thrombus. Normal compressibility. Other Findings:  None. IMPRESSION: No  evidence of DVT within either lower extremity. Electronically Signed   By: Sandi Mariscal M.D.   On: 08/27/2022 07:54   CT HEAD WO CONTRAST  Result Date: 08/27/2022 CLINICAL DATA:  Stroke follow-up EXAM: CT HEAD WITHOUT CONTRAST TECHNIQUE: Contiguous axial images were obtained from the base of the skull through the vertex without intravenous contrast. RADIATION DOSE REDUCTION: This exam was performed according to the departmental dose-optimization program which includes automated exposure control, adjustment of the mA and/or kV according to patient size and/or use of iterative reconstruction technique. COMPARISON:  MRI head 08/26/2022 and CT head 08/26/2022 FINDINGS: Brain: No intracranial hemorrhage, mass effect, or evidence of acute infarct. The small infarcts seen on MRI 08/26/2022 are not well demonstrated on CT. No hydrocephalus. No extra-axial fluid collection. Generalized cerebral atrophy. Ill-defined hypoattenuation within the cerebral white matter is nonspecific but consistent with chronic small vessel ischemic disease. Vascular: No hyperdense vessel. Intracranial arterial calcification. Skull: No fracture or focal lesion. Sinuses/Orbits: No acute finding. Paranasal sinuses and mastoid air cells are well aerated. Other: None. IMPRESSION: No acute intracranial abnormality on CT. The small infarcts on MRI 08/26/2022 are not well visualized. No hemorrhagic transformation or mass effect. Electronically Signed   By: Placido Sou M.D.   On: 08/27/2022 02:49   MR BRAIN WO CONTRAST  Result Date: 08/26/2022 CLINICAL DATA:  Provided history: Neuro deficit, acute, stroke suspected. EXAM: MRI HEAD WITHOUT CONTRAST TECHNIQUE: Multiplanar, multiecho pulse sequences of the brain and surrounding structures were obtained without intravenous contrast. COMPARISON:  Non-contrast head CT and CT angiogram head/neck 08/26/2022. FINDINGS: Brain: No age advanced or lobar predominant cerebral atrophy. Cerebellar atrophy.  Punctate acute cortical infarct within the posterior left insula (series 5, image 24). 4 mm acute cortical infarct within the left precentral gyrus (series 5, image 38). 6 mm acute infarct within the right temporal occipital lobe periventricular white matter (series 5, image 20). Multifocal T2 FLAIR hyperintense signal abnormality within the cerebral white matter, nonspecific but compatible with mild  chronic small vessel ischemic disease. No evidence of an intracranial mass. No chronic intracranial blood products. No extra-axial fluid collection. No midline shift. Vascular: Maintained flow voids within the proximal large arterial vessels. Skull and upper cervical spine: No focal suspicious marrow lesion. Sinuses/Orbits: No mass or acute finding within the imaged orbits. No significant paranasal sinus disease. Other: Trace fluid within the left mastoid air cells. IMPRESSION: 4 mm acute cortical infarct within the left precentral gyrus. Punctate acute cortical infarct within the posterior left insula. 6 mm acute infarct within the right temporo-occipital lobe periventricular white matter. Given the involvement of multiple vascular territories, consider an embolic process. Mild chronic small vessel ischemic changes within the cerebral white matter. Cerebellar atrophy. Electronically Signed   By: Kellie Simmering D.O.   On: 08/26/2022 14:55   CT ANGIO HEAD NECK W WO CM W PERF (CODE STROKE)  Result Date: 08/26/2022 CLINICAL DATA:  Aphasia, vision changes EXAM: CT ANGIOGRAPHY HEAD AND NECK CT PERFUSION BRAIN TECHNIQUE: Multidetector CT imaging of the head and neck was performed using the standard protocol during bolus administration of intravenous contrast. Multiplanar CT image reconstructions and MIPs were obtained to evaluate the vascular anatomy. Carotid stenosis measurements (when applicable) are obtained utilizing NASCET criteria, using the distal internal carotid diameter as the denominator. Multiphase CT imaging of  the brain was performed following IV bolus contrast injection. Subsequent parametric perfusion maps were calculated using RAPID software. RADIATION DOSE REDUCTION: This exam was performed according to the departmental dose-optimization program which includes automated exposure control, adjustment of the mA and/or kV according to patient size and/or use of iterative reconstruction technique. CONTRAST:  152m OMNIPAQUE IOHEXOL 350 MG/ML SOLN COMPARISON:  No prior CTA, correlation is made with CT head 08/26/2022 FINDINGS: CT HEAD FINDINGS For noncontrast findings, please see same day CT head. ASPECTS ([place] [facility] [facility] [facility] Early CT Score): 10 CTA NECK FINDINGS Aortic arch: Standard branching. Imaged portion shows no evidence of aneurysm or dissection. No significant stenosis of the major arch vessel origins. Right carotid system: No evidence of dissection, occlusion, or hemodynamically significant stenosis (greater than 50%). Left carotid system: No evidence of dissection, occlusion, or hemodynamically significant stenosis (greater than 50%). Atherosclerotic disease at the bifurcation and in the proximal ICA is not hemodynamically significant. Vertebral arteries: Left dominant system. No evidence of dissection, occlusion, or hemodynamically significant stenosis (greater than 50%). Skeleton: Degenerative changes in the cervical spine. No acute osseous abnormality. Other neck: Small tracheal diverticulum.  Otherwise negative. Upper chest: Interlobular septal thickening, possibly mild pulmonary edema. Review of the MIP images confirms the above findings CTA HEAD FINDINGS Anterior circulation: Both internal carotid arteries are patent to the termini, without significant stenosis. A1 segments patent. Normal anterior communicating artery. Anterior cerebral arteries are patent to their distal aspects. No M1 stenosis or occlusion. MCA branches perfused and symmetric. Posterior circulation: Vertebral arteries patent to the  vertebrobasilar junction without stenosis. Posterior inferior cerebellar arteries patent proximally. Basilar patent to its distal aspect. Superior cerebellar arteries patent proximally. Patent P1 segments. PCAs perfused to their distal aspects without stenosis. The left posterior communicating artery is patent. The right is not visualized. Venous sinuses: As permitted by contrast timing, patent. Anatomic variants: None significant. Review of the MIP images confirms the above findings CT Brain Perfusion Findings: ASPECTS: 10 CBF (<30%) Volume: 072mPerfusion (Tmax>6.0s) volume: 53m34mismatch Volume: 53mL28mfarction Location:None IMPRESSION: 1. No evidence of infarct core or penumbra on CT perfusion. 2.  No intracranial large vessel occlusion or significant stenosis.  3. No hemodynamically significant stenosis in the neck. 4. Interlobular septal thickening in the imaged lungs, possibly mild pulmonary edema. Electronically Signed   By: Merilyn Baba M.D.   On: 08/26/2022 03:41   DG Chest Port 1 View  Result Date: 08/26/2022 CLINICAL DATA:  Stroke EXAM: PORTABLE CHEST 1 VIEW COMPARISON:  07/29/2022 FINDINGS: Lungs are clear. No pneumothorax or pleural effusion. Cardiac size mildly enlarged. Pulmonary vascularity is normal. No acute bone abnormality. IMPRESSION: Mild cardiomegaly. Electronically Signed   By: Fidela Salisbury M.D.   On: 08/26/2022 02:59   CT HEAD CODE STROKE WO CONTRAST  Result Date: 08/26/2022 CLINICAL DATA:  Code stroke.  Aphasia EXAM: CT HEAD WITHOUT CONTRAST TECHNIQUE: Contiguous axial images were obtained from the base of the skull through the vertex without intravenous contrast. RADIATION DOSE REDUCTION: This exam was performed according to the departmental dose-optimization program which includes automated exposure control, adjustment of the mA and/or kV according to patient size and/or use of iterative reconstruction technique. COMPARISON:  None Available. FINDINGS: Brain: No evidence of acute  infarction, hemorrhage, cerebral edema, mass, mass effect, or midline shift. No hydrocephalus or extra-axial collection. Vascular: No hyperdense vessel. Skull: Negative for fracture or focal lesion. Sinuses/Orbits: No acute finding. Other: The mastoid air cells are well aerated. ASPECTS [facility] Stroke Program Early CT Score) - Ganglionic level infarction (caudate, lentiform nuclei, internal capsule, insula, M1-M3 cortex): 7 - Supraganglionic infarction (M4-M6 cortex): 3 Total score (0-10 with 10 being normal): 10 IMPRESSION: 1. No acute intracranial process. 2. ASPECTS is 10 These findings were discussed by telephone on 08/26/2022 at 2:36 am with provider JADE SUNG . Electronically Signed   By: Merilyn Baba M.D.   On: 08/26/2022 02:37      Labs: BNP (last 3 results) Recent Labs    08/26/22 0236  BNP 829.5*   Basic Metabolic Panel: Recent Labs  Lab 08/26/22 0409 08/27/22 0426 08/28/22 0550 08/29/22 0441  NA 132* 135 138 137  K 3.4* 3.7 4.1 3.8  CL 98 101 104 102  CO2 '24 24 27 27  '$ GLUCOSE 124* 114* 98 96  BUN '16 10 13 12  '$ CREATININE 0.92 0.68 0.87 0.86  CALCIUM 8.9 9.2 9.0 8.8*  MG 2.1 1.8 2.2 2.5*  PHOS 4.4 4.1 4.5  --    Liver Function Tests: Recent Labs  Lab 08/26/22 0409  AST 23  ALT 19  ALKPHOS 77  BILITOT 0.7  PROT 7.8  ALBUMIN 4.5   No results for input(s): "LIPASE", "AMYLASE" in the last 168 hours. No results for input(s): "AMMONIA" in the last 168 hours. CBC: Recent Labs  Lab 08/26/22 0236 08/27/22 0426 08/28/22 0550 08/29/22 0441  WBC 9.7 12.0* 8.6 7.9  NEUTROABS 6.0 9.3*  --   --   HGB 13.4 13.5 14.2 14.6  HCT 40.8 40.7 42.2 43.3  MCV 92.1 90.6 92.3 91.0  PLT 381 342 324 364   Cardiac Enzymes: No results for input(s): "CKTOTAL", "CKMB", "CKMBINDEX", "TROPONINI" in the last 168 hours. BNP: Invalid input(s): "POCBNP" CBG: Recent Labs  Lab 08/26/22 0235 08/26/22 0909  GLUCAP 102* 160*   D-Dimer Recent Labs    08/26/22 1250  DDIMER 3.75*    Hgb A1c No results for input(s): "HGBA1C" in the last 72 hours. Lipid Profile No results for input(s): "CHOL", "HDL", "LDLCALC", "TRIG", "CHOLHDL", "LDLDIRECT" in the last 72 hours. Thyroid function studies Recent Labs    08/26/22 1250  TSH 0.443   Anemia work up No results for input(s): "VITAMINB12", "  FOLATE", "FERRITIN", "TIBC", "IRON", "RETICCTPCT" in the last 72 hours. Urinalysis    Component Value Date/Time   COLORURINE COLORLESS (A) 08/26/2022 0237   APPEARANCEUR CLEAR (A) 08/26/2022 0237   LABSPEC 1.021 08/26/2022 0237   PHURINE 5.0 08/26/2022 0237   GLUCOSEU NEGATIVE 08/26/2022 0237   HGBUR SMALL (A) 08/26/2022 0237   BILIRUBINUR NEGATIVE 08/26/2022 0237   BILIRUBINUR Small 02/13/2017 1201   KETONESUR NEGATIVE 08/26/2022 0237   PROTEINUR NEGATIVE 08/26/2022 0237   UROBILINOGEN 0.2 02/13/2017 1201   NITRITE NEGATIVE 08/26/2022 0237   LEUKOCYTESUR NEGATIVE 08/26/2022 0237   Sepsis Labs Recent Labs  Lab 08/26/22 0236 08/27/22 0426 08/28/22 0550 08/29/22 0441  WBC 9.7 12.0* 8.6 7.9   Microbiology Recent Results (from the past 240 hour(s))  Resp Panel by RT-PCR (Flu A&B, Covid) Anterior Nasal Swab     Status: None   Collection Time: 08/26/22  2:36 AM   Specimen: Anterior Nasal Swab  Result Value Ref Range Status   SARS Coronavirus 2 by RT PCR NEGATIVE NEGATIVE Final    Comment: (NOTE) SARS-CoV-2 target nucleic acids are NOT DETECTED.  The SARS-CoV-2 RNA is generally detectable in upper respiratory specimens during the acute phase of infection. The lowest concentration of SARS-CoV-2 viral copies this assay can detect is 138 copies/mL. A negative result does not preclude SARS-Cov-2 infection and should not be used as the sole basis for treatment or other patient management decisions. A negative result may occur with  improper specimen collection/handling, submission of specimen other than nasopharyngeal swab, presence of viral mutation(s) within the areas  targeted by this assay, and inadequate number of viral copies(<138 copies/mL). A negative result must be combined with clinical observations, patient history, and epidemiological information. The expected result is Negative.  Fact Sheet for Patients:  EntrepreneurPulse.com.au  Fact Sheet for Healthcare Providers:  IncredibleEmployment.be  This test is no t yet approved or cleared by the [place] FDA and  has been authorized for detection and/or diagnosis of SARS-CoV-2 by FDA under an Emergency Use Authorization (EUA). This EUA will remain  in effect (meaning this test can be used) for the duration of the COVID-19 declaration under Section 564(b)(1) of the Act, 21 U.S.C.section 360bbb-3(b)(1), unless the authorization is terminated  or revoked sooner.       Influenza A by PCR NEGATIVE NEGATIVE Final   Influenza B by PCR NEGATIVE NEGATIVE Final    Comment: (NOTE) The Xpert Xpress SARS-CoV-2/FLU/RSV plus assay is intended as an aid in the diagnosis of influenza from Nasopharyngeal swab specimens and should not be used as a sole basis for treatment. Nasal washings and aspirates are unacceptable for Xpert Xpress SARS-CoV-2/FLU/RSV testing.  Fact Sheet for Patients: EntrepreneurPulse.com.au  Fact Sheet for Healthcare Providers: IncredibleEmployment.be  This test is not yet approved or cleared by the [place] FDA and has been authorized for detection and/or diagnosis of SARS-CoV-2 by FDA under an Emergency Use Authorization (EUA). This EUA will remain in effect (meaning this test can be used) for the duration of the COVID-19 declaration under Section 564(b)(1) of the Act, 21 U.S.C. section 360bbb-3(b)(1), unless the authorization is terminated or revoked.  Performed at [facility], [address] [place], [place] [address]   MRSA Next Gen by PCR, Nasal     Status: None   Collection  Time: 08/26/22  9:11 AM   Specimen: Nasal Mucosa; Nasal Swab  Result Value Ref Range Status   MRSA by PCR Next Gen NOT DETECTED NOT DETECTED Final  Comment: (NOTE) The GeneXpert MRSA Assay (FDA approved for NASAL specimens only), is one component of a comprehensive MRSA colonization surveillance program. It is not intended to diagnose MRSA infection nor to guide or monitor treatment for MRSA infections. Test performance is not FDA approved in patients less than 7 years old. Performed at [facility], [address] [place], [place] [address]      Total time spend on discharging this patient, including the last patient exam, discussing the hospital stay, instructions for ongoing care as it relates to all pertinent caregivers, as well as preparing the medical discharge records, prescriptions, and/or referrals as applicable, is 35 minutes.    Enzo Bi, MD  [facility] 08/29/2022, 11:46 AM

## 2022-08-29 NOTE — TOC Initial Note (Signed)
Transition of Care [facility]) - Initial/Assessment Note    Patient Details  Name: Kathleen Perkins MRN: [id number] Date of Birth: 1961/02/23  Transition of Care [facility]) CM/SW Contact:    Shelbie Hutching, RN Phone Number: 08/29/2022, 12:26 PM  Clinical Narrative:                  Transition of Care [facility]) Screening Note   Patient Details  Name: Kathleen Perkins Date of Birth: Apr 14, 1961   Transition of Care [facility]) CM/SW Contact:    Shelbie Hutching, RN Phone Number: 08/29/2022, 12:26 PM    Transition of Care Department [facility]) has reviewed patient and no TOC needs have been identified at this time. We will continue to monitor patient advancement through interdisciplinary progression rounds. If new patient transition needs arise, please place a TOC consult.          Patient Goals and CMS Choice        Expected Discharge Plan and Services           Expected Discharge Date: 08/29/22                                    Prior Living Arrangements/Services                       Activities of Daily Living      Permission Sought/Granted                  Emotional Assessment              Admission diagnosis:  Aphasia [R47.01] Hypertensive urgency [I16.0] Anaphylaxis, initial encounter [T78.2XXA] Cerebrovascular accident (CVA), unspecified mechanism ([place]) [I63.9] Ischemic cerebrovascular accident (CVA) [facility]) [I63.9] Patient Active Problem List   Diagnosis Date Noted   Aphasia    Hypertensive urgency    Bipolar 1 disorder, mixed ([place])    Atrial fibrillation with RVR ([place])    Cerebrovascular accident (CVA) ([place]) 08/26/2022   Class 1 obesity without serious comorbidity with body mass index (BMI) of 32.0 to 32.9 in adult 11/07/2021   Hyperlipidemia 11/12/2018   B12 deficiency 11/12/2018   Avitaminosis D 11/12/2018   Chronic cervical pain 02/25/2017   History of melanoma 02/25/2017   Mitral valve prolapse 02/25/2017   Bipolar affective ([place])  11/26/2016   Anxiety 06/01/2015   Arrhythmia 06/01/2015   PCP:  Virginia Crews, MD Pharmacy:   [place], [place] - [address] [place] [address] Phone: [phone] Fax: [phone]  [facility] [id number], [place] [address] [place] [address] Phone: [phone] Fax: [phone]     Social Determinants of Health (SDOH) Interventions    Readmission Risk Interventions     No data to display

## 2022-08-29 NOTE — Progress Notes (Signed)
Rounding Note    Patient Name: Kathleen Perkins Date of Encounter: 08/29/2022  [place] Cardiologist: Mertie Moores, MD   Subjective   Plan for possible d/c today. Rates still elevated 130-140s in afib. BP intermittently soft.   Inpatient Medications    Scheduled Meds:  apixaban  5 mg Oral BID   atorvastatin  40 mg Oral Daily   Chlorhexidine Gluconate Cloth  6 each Topical Daily   digoxin  0.25 mg Oral Daily   diltiazem  300 mg Oral Daily   folic acid  1 mg Oral Daily   lamoTRIgine  100 mg Oral BID   LORazepam  0-4 mg Oral Q8H   metoprolol tartrate  50 mg Oral BID   multivitamin with minerals  1 tablet Oral Daily   pantoprazole  40 mg Oral Daily   thiamine  100 mg Oral Daily   Continuous Infusions:  PRN Meds: acetaminophen, docusate sodium, hydrOXYzine, LORazepam **OR** LORazepam, metoprolol tartrate, ondansetron (ZOFRAN) IV, mouth rinse, polyethylene glycol, propranolol   Vital Signs    Vitals:   08/29/22 0745 08/29/22 0800 08/29/22 0815 08/29/22 0930  BP:  93/65  (!) 147/78  Pulse:      Resp: '18 18 19   '$ Temp:      TempSrc:      SpO2:      Weight:       No intake or output data in the 24 hours ending 08/29/22 1002    08/28/2022    5:00 AM 08/27/2022    1:40 AM 08/26/2022    2:55 AM  Last 3 Weights  Weight (lbs) 179 lb 14.3 oz 185 lb 13.6 oz 186 lb 4.6 oz  Weight (kg) 81.6 kg 84.3 kg 84.5 kg      Telemetry    Afib HR 130-140s - Personally Reviewed  ECG    No new - Personally Reviewed  Physical Exam   GEN: No acute distress.   Neck: No JVD Cardiac: Irreg Irreg, tachy, no murmurs, rubs, or gallops.  Respiratory: Clear to auscultation bilaterally. GI: Soft, nontender, non-distended  MS: No edema; No deformity. Neuro:  Nonfocal  Psych: Normal affect   Labs    High Sensitivity Troponin:   Recent Labs  Lab 08/26/22 0236 08/26/22 0409  TROPONINIHS 7 15     Chemistry Recent Labs  Lab 08/26/22 0409 08/27/22 0426 08/28/22 0550  08/29/22 0441  NA 132* 135 138 137  K 3.4* 3.7 4.1 3.8  CL 98 101 104 102  CO2 '24 24 27 27  '$ GLUCOSE 124* 114* 98 96  BUN '16 10 13 12  '$ CREATININE 0.92 0.68 0.87 0.86  CALCIUM 8.9 9.2 9.0 8.8*  MG 2.1 1.8 2.2 2.5*  PROT 7.8  --   --   --   ALBUMIN 4.5  --   --   --   AST 23  --   --   --   ALT 19  --   --   --   ALKPHOS 77  --   --   --   BILITOT 0.7  --   --   --   GFRNONAA >60 >60 >60 >60  ANIONGAP '10 10 7 8    '$ Lipids  Recent Labs  Lab 08/26/22 0409  CHOL 223*  TRIG 117  HDL 87  LDLCALC 113*  CHOLHDL 2.6    Hematology Recent Labs  Lab 08/27/22 0426 08/28/22 0550 08/29/22 0441  WBC 12.0* 8.6 7.9  RBC 4.49 4.57 4.76  HGB 13.5 14.2 14.6  HCT 40.7 42.2 43.3  MCV 90.6 92.3 91.0  MCH 30.1 31.1 30.7  MCHC 33.2 33.6 33.7  RDW 13.1 13.4 13.2  PLT 342 324 364   Thyroid  Recent Labs  Lab 08/26/22 1250  TSH 0.443    BNP Recent Labs  Lab 08/26/22 0236  BNP 327.3*    DDimer  Recent Labs  Lab 08/26/22 1250  DDIMER 3.75*     Radiology    No results found.  Cardiac Studies   Echo 08/26/22   1. Left ventricular ejection fraction, by estimation, is 55 to 60%. The  left ventricle has normal function. The left ventricle has no regional  wall motion abnormalities. There is moderate left ventricular hypertrophy.  Left ventricular diastolic function   could not be evaluated.   2. Right ventricular systolic function is normal. The right ventricular  size is normal. There is normal pulmonary artery systolic pressure.   3. The mitral valve is abnormal. Mild mitral valve regurgitation. No  evidence of mitral stenosis.   4. Tricuspid valve regurgitation is mild to moderate.   5. The aortic valve is tricuspid. There is mild thickening of the aortic  valve. Aortic valve regurgitation is not visualized. Aortic valve  sclerosis is present, with no evidence of aortic valve stenosis.   6. The inferior vena cava is normal in size with greater than 50%  respiratory  variability, suggesting right atrial pressure of 3 mmHg.   Patient Profile     61 y.o. female with a hx of anxiety, mitral valve prolapse, hyperlipidemia, and palpitations, who is being seen 08/26/2022 for the evaluation of new onset atrial fibrillation.  Assessment & Plan    New onset Afib RVR - rates still elevated to 130s-140s, the patient is asymptomatic - Diltiazem '300mg'$  daily - Lopressor '50mg'$  BID - digoxin 0.'25mg'$  daily - Bps low at times, unable to titrate BB/CCB - Continue Eliquis '5mg'$  BID - no plans for TEE/DCCV given recent strokes  Embolic stroke - TNK administered - neurology following  HTN - improving BP - Lopressor '50mg'$  BID - Diltiazem '300mg'$  daily  HLD -LDL 113 - continue statin therapy  For questions or updates, please contact [place] Please consult [email or web address] for contact info under        Signed, Eddis Pingleton Ninfa Meeker, PA-C  08/29/2022, 10:02 AM

## 2022-08-29 NOTE — Progress Notes (Signed)
Cardiology Office Note:    Date:  09/08/2022   ID:  Kathleen Perkins, DOB 05-03-1961, MRN [id number]  PCP:  Erasmo Downer, MD  [facility] Cardiologist:  [facility] Electrophysiologist:  None   Referring MD: Erasmo Downer, MD   Chief Complaint  Patient presents with   Atrial Fibrillation   Cerebrovascular Accident     Jan. 7, 2022   Kathleen Perkins is a 61 y.o. female with a hx of palpitations.  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  Has had palpitations - was started on Verapamil Then atenolol  Worse with menapause  palps seem to be worse with stress Episodes of tachycardia  - would last perhaps 30 minutes   Exercises intermittantly.  Did well around 2017 for years , not so much this past year Really enjoyed exercising.  Admits that her palpitations   Tries to avoid caffeine,   Has bipolar - tends to be more manic than depressed.   Oct. 9, 2023 Hope seen today for follow up of her mitral regurgitation, hyperlipidemia, and recent diagnosis of atrial fib and a recent stroke   Lipids from 08/23/22 : Chol = 223 HDL = 87 LDL = 113 Trigs = 117  These most recent lipids have improved on Atorvastatin since her previous labs in Dec. 2021  She was admitted to [facility] ( 9/25, 2023) several weeks ago  with an acute stroke and atrial fib.  Received TNK  Had a severe contrast allergy with the head CT .  Improved significantly   Echo shows normal LV function   Has noticed more palpitations   Had an ER visit on aug. 29, 2023  for chest pain  Her father and Clementeen Graham (brother ) had CABG  Brother Genevie Cheshire is doing well   Occasional episodes of apprehension    Has had paroxysmal atrial fib - has been persistent for the past 2 weeks.    The plan is to continue anticoagulation for the next week Schedule TEE /  cardioversion .  I specifically would like to do a TEE prior to the CV since she is just 2 weeks out from a stroke .  If she converts to sinus rhythm before or after her scheduled cardioversion,  she is to start taking Flecainide 50 mg BID.    Past Medical History:  Diagnosis Date   Anxiety    Melanoma of upper arm (HCC)    MVP (mitral valve prolapse)    Primary osteoarthritis of left knee 08/15/2014    Past Surgical History:  Procedure Laterality Date   ARTHROSCOPIC REPAIR ACL     BREAST BIOPSY Left 06/23/2013   u/s bx- neg   BREAST EXCISIONAL BIOPSY Left 07/14/2013   us/bx-neg   MELANOMA EXCISION     left arm.    REPLACEMENT TOTAL KNEE     left     Current Medications: Current Meds  Medication Sig   apixaban (ELIQUIS) 5 MG TABS tablet Take 1 tablet (5 mg total) by mouth 2 (two) times daily.   atorvastatin (LIPITOR) 40 MG tablet Take 1 tablet (40 mg total) by mouth daily.   digoxin (LANOXIN) 0.125 MG tablet Take 1 tablet (0.125 mg total) by mouth daily.   diltiazem (CARDIZEM CD) 240 MG 24 hr capsule Take 1  capsule (240 mg total) by mouth daily.   flecainide (TAMBOCOR) 50 MG tablet Take 1 tablet (50 mg total) by mouth 2 (two) times daily.   folic acid (FOLVITE) 1 MG tablet Take 1 tablet (1 mg total) by mouth daily.   hydrOXYzine (ATARAX) 10 MG tablet TAKE ONE TABLET BY MOUTH THREE TIMES A DAY AS NEEDED   lamoTRIgine (LAMICTAL) 100 MG tablet TAKE ONE TABLET BY MOUTH TWICE A DAY   metoprolol tartrate (LOPRESSOR) 50 MG tablet Take 1 tablet (50 mg total) by mouth 2 (two) times daily.   thiamine (VITAMIN B-1) 100 MG tablet Take 1 tablet (100 mg total) by mouth daily.   traZODone (DESYREL) 100 MG tablet Take 1 tablet (100 mg total) by mouth at bedtime as needed for sleep. Please schedule office visit before any future refill.     Allergies:   Ivp dye [iodinated contrast media], Codeine, Erythromycin, Nsaids, and Ibuprofen   Social History   Socioeconomic History   Marital  status: Divorced    Spouse name: Not on file   Number of children: Not on file   Years of education: Not on file   Highest education level: Not on file  Occupational History   Not on file  Tobacco Use   Smoking status: Never   Smokeless tobacco: Never  Substance and Sexual Activity   Alcohol use: No   Drug use: No   Sexual activity: Not on file  Other Topics Concern   Not on file  Social History Narrative   Not on file   Social Determinants of Health   Financial Resource Strain: Not on file  Food Insecurity: Not on file  Transportation Needs: Not on file  Physical Activity: Not on file  Stress: Not on file  Social Connections: Not on file     Family History: The patient's family history includes Heart attack in her brother; Heart disease in her brother and father; Hyperlipidemia in her brother and father; Hypertension in her brother and father. There is no history of Breast cancer.  ROS:   Please see the history of present illness.     All other systems reviewed and are negative.  EKGs/Labs/Other Studies Reviewed:    The following studies were reviewed today:   EKG:   December 07, 2020: Normal sinus rhythm with premature ventricular contractions.  No ST or T wave changes.  Recent Labs: 08/26/2022: ALT 19; B Natriuretic Peptide 327.3; TSH 0.443 08/29/2022: BUN 12; Creatinine, Ser 0.86; Hemoglobin 14.6; Magnesium 2.5; Platelets 364; Potassium 3.8; Sodium 137  Recent Lipid Panel    Component Value Date/Time   CHOL 223 (H) 08/26/2022 0409   CHOL 268 (H) 11/08/2020 1423   TRIG 117 08/26/2022 0409   HDL 87 08/26/2022 0409   HDL 93 11/08/2020 1423   CHOLHDL 2.6 08/26/2022 0409   VLDL 23 08/26/2022 0409   LDLCALC 113 (H) 08/26/2022 0409   LDLCALC 145 (H) 11/08/2020 1423     Risk Assessment/Calculations:       Physical Exam:     Physical Exam: Blood pressure 135/82, pulse 100, height 5\' 4"  (1.626 m), weight 183 lb (83 kg), SpO2 98 %.       GEN:  Well  nourished, well developed in no acute distress HEENT: Normal NECK: No JVD; No carotid bruits LYMPHATICS: No lymphadenopathy CARDIAC:  Irreg. Irreg.  , soft systolic murmur  RESPIRATORY:  Clear to auscultation without rales, wheezing or rhonchi  ABDOMEN: Soft, non-tender, non-distended MUSCULOSKELETAL:  No edema; No  deformity  SKIN: Warm and dry NEUROLOGIC:  Alert and oriented x 3   ASSESSMENT:    1. Pre-procedure lab exam     PLAN:    In order of problems listed above:  Paroxysmal atrial fib: Hope has had a diagnosis of atrial fibrillation since presenting to the hospital 2 weeks ago with a stroke.  She had some transient normal sinus rhythm but apparently has had atrial fibrillation since her discharge. She has been on Eliquis 5 mg twice a day.  She feels the palpitations intermittently.  On encouraged her to get an Apple Watch or Kardia monitor monitor her atrial fibrillation.  If she converts to normal sinus rhythm before the cardioversion or if were successful in achieving sinus rhythm after the cardioversion we will have her start flecainide 50 mg twice a day.  I will see her the following week for follow-up visit.   2.  Hyperlipidemia:  Her LDL from August 26, 2022 is 113. She has a family history of coronary artery disease-her father and her older brother both had coronary artery bypass grafting. We will do an an ischemic assessment at some point following her cardioversion.  We may consider coronary calcium score.  We may also consider a coronary CT angiogram with premedications for IV contrast        Medication Adjustments/Labs and Tests Ordered: Current medicines are reviewed at length with the patient today.  Concerns regarding medicines are outlined above.  Orders Placed This Encounter  Procedures   Basic metabolic panel   CBC   Meds ordered this encounter  Medications   flecainide (TAMBOCOR) 50 MG tablet    Sig: Take 1 tablet (50 mg total) by mouth 2  (two) times daily.    Dispense:  180 tablet    Refill:  3    Patient Instructions  Medication Instructions:  START FLECAINIDE 50 MG TWICE DAILY AFTER CARDIOVERSION  *If you need a refill on your cardiac medications before your next appointment, please call your pharmacy*   Lab Work: TODAY CBC AND BMET  If you have labs (blood work) drawn today and your tests are completely normal, you will receive your results only by: MyChart Message (if you have MyChart) OR A paper copy in the mail If you have any lab test that is abnormal or we need to change your treatment, we will call you to review the results.   Testing/Procedures: Your physician has requested that you have a TEE. During a TEE, sound waves are used to create images of your heart. It provides your doctor with information about the size and shape of your heart and how well your heart's chambers and valves are working. In this test, a transducer is attached to the end of a flexible tube that's guided down your throat and into your esophagus (the tube leading from you mouth to your stomach) to get a more detailed image of your heart. You are not awake for the procedure. Please see the instruction sheet given to you today. For further information please visit [email or web address]  Your physician has recommended that you have a Cardioversion (DCCV). Electrical Cardioversion uses a jolt of electricity to your heart either through paddles or wired patches attached to your chest. This is a controlled, usually prescheduled, procedure. Defibrillation is done under light anesthesia in the hospital, and you usually go home the day of the procedure. This is done to get your heart back into a normal rhythm. You are not awake for the procedure.  Please see the instruction sheet given to you today.    Follow-Up: At [facility], you and your health needs are our priority.  As part of our continuing mission to provide you with exceptional  heart care, we have created designated Provider Care Teams.  These Care Teams include your primary Cardiologist (physician) and Advanced Practice Providers (APPs -  Physician Assistants and Nurse Practitioners) who all work together to provide you with the care you need, when you need it.  We recommend signing up for the patient portal called "MyChart".  Sign up information is provided on this After Visit Summary.  MyChart is used to connect with patients for Virtual Visits (Telemedicine).  Patients are able to view lab/test results, encounter notes, upcoming appointments, etc.  Non-urgent messages can be sent to your provider as well.   To learn more about what you can do with MyChart, go to [email or web address].    Your next appointment:    OCTOBER 23   The format for your next appointment:   In Person  Provider:   Kristeen Miss, MD       Other Instructions NONE  Important Information About Sugar         Signed, Kristeen Miss, MD  09/08/2022 1:48 PM    [facility]

## 2022-09-01 ENCOUNTER — Ambulatory Visit: Payer: BC Managed Care – PPO | Admitting: Physician Assistant

## 2022-09-07 ENCOUNTER — Encounter: Payer: Self-pay | Admitting: Cardiovascular Disease

## 2022-09-08 ENCOUNTER — Encounter: Payer: Self-pay | Admitting: *Deleted

## 2022-09-08 ENCOUNTER — Ambulatory Visit: Payer: BC Managed Care – PPO | Attending: Cardiovascular Disease | Admitting: Cardiovascular Disease

## 2022-09-08 ENCOUNTER — Encounter: Payer: Self-pay | Admitting: Cardiovascular Disease

## 2022-09-08 VITALS — BP 135/82 | HR 100 | Ht 64.0 in | Wt 183.0 lb

## 2022-09-08 DIAGNOSIS — Z01812 Encounter for preprocedural laboratory examination: Secondary | ICD-10-CM | POA: Diagnosis not present

## 2022-09-08 MED ORDER — FLECAINIDE ACETATE 50 MG PO TABS: 50.0000 mg | ORAL_TABLET | Freq: Two times a day (BID) | ORAL | 3 refills | Status: DC

## 2022-09-08 NOTE — Patient Instructions (Addendum)
Medication Instructions:  START FLECAINIDE 50 MG TWICE DAILY AFTER CARDIOVERSION  *If you need a refill on your cardiac medications before your next appointment, please call your pharmacy*   Lab Work: TODAY CBC AND BMET  If you have labs (blood work) drawn today and your tests are completely normal, you will receive your results only by: [facility] (if you have MyChart) OR A paper copy in the mail If you have any lab test that is abnormal or we need to change your treatment, we will call you to review the results.   Testing/Procedures: Your physician has requested that you have a TEE. During a TEE, sound waves are used to create images of your heart. It provides your doctor with information about the size and shape of your heart and how well your heart's chambers and valves are working. In this test, a transducer is attached to the end of a flexible tube that's guided down your throat and into your esophagus (the tube leading from you mouth to your stomach) to get a more detailed image of your heart. You are not awake for the procedure. Please see the instruction sheet given to you today. For further information please visit [email or web address].  Your physician has recommended that you have a Cardioversion (DCCV). Electrical Cardioversion uses a jolt of electricity to your heart either through paddles or wired patches attached to your chest. This is a controlled, usually prescheduled, procedure. Defibrillation is done under light anesthesia in the hospital, and you usually go home the day of the procedure. This is done to get your heart back into a normal rhythm. You are not awake for the procedure. Please see the instruction sheet given to you today.    Follow-Up: At [facility], you and your health needs are our priority.  As part of our continuing mission to provide you with exceptional heart care, we have created designated Provider Care Teams.  These Care Teams include your  primary Cardiologist (physician) and Advanced Practice Providers (APPs -  Physician Assistants and Nurse Practitioners) who all work together to provide you with the care you need, when you need it.  We recommend signing up for the patient portal called "MyChart".  Sign up information is provided on this After Visit Summary.  MyChart is used to connect with patients for Virtual Visits (Telemedicine).  Patients are able to view lab/test results, encounter notes, upcoming appointments, etc.  Non-urgent messages can be sent to your provider as well.   To learn more about what you can do with MyChart, go to [email or web address].    Your next appointment:    OCTOBER 23   The format for your next appointment:   In Person  Provider:   Mertie Moores, MD       Other Instructions NONE  Important Information About Sugar

## 2022-09-09 LAB — BASIC METABOLIC PANEL
BUN/Creatinine Ratio: 10 — ABNORMAL LOW (ref 12–28)
BUN: 8 mg/dL (ref 8–27)
CO2: 24 mmol/L (ref 20–29)
Calcium: 9.9 mg/dL (ref 8.7–10.3)
Chloride: 100 mmol/L (ref 96–106)
Creatinine, Ser: 0.81 mg/dL (ref 0.57–1.00)
Glucose: 102 mg/dL — ABNORMAL HIGH (ref 70–99)
Potassium: 4.4 mmol/L (ref 3.5–5.2)
Sodium: 142 mmol/L (ref 134–144)
eGFR: 83 mL/min/{1.73_m2} (ref >59)

## 2022-09-09 LAB — CBC
Hematocrit: 40.1 % (ref 34.0–46.6)
Hemoglobin: 14.1 g/dL (ref 11.1–15.9)
MCH: 32 pg (ref 26.6–33.0)
MCHC: 35.2 g/dL (ref 31.5–35.7)
MCV: 91 fL (ref 79–97)
Platelets: 337 10*3/uL (ref 150–450)
RBC: 4.4 x10E6/uL (ref 3.77–5.28)
RDW: 12.5 % (ref 11.7–15.4)
WBC: 8.2 10*3/uL (ref 3.4–10.8)

## 2022-09-10 NOTE — Progress Notes (Deleted)
      Established patient visit   Patient: Kathleen Perkins   DOB: 1961-06-18   61 y.o. Female  MRN: [id number] Visit Date: 09/11/2022  Today's healthcare provider: Lavon Paganini, MD   No chief complaint on file.  Subjective    HPI  Follow up Hospitalization  Patient was admitted to [facility] on 08/26/2022 and discharged on 08/29/2022. She was treated for embolic strokes. Treatment for this included start Eliquis. Telephone follow up was done on  She reports {excellent/good/fair:19992} compliance with treatment. She reports this condition is {resolved/improved/worsened:23923}. -Afib w RVR discharged on diltiazem '240mg'$  daily, metoprolol '50mg'$  twice daily and digoxin 0.'125mg'$  daily. -hypertensive urgency continue diltiazem and Lopressor (new). D/C home HCTZ, atenolol and propranolol. -hyperlipidemia continue atorvastatin '40mg'$  daily (new) -Bipolar disorder continue Lamictal and hydroxyzine PRN. D/C Klonopin ----------------------------------------------------------------------------------------- -   Medications: Outpatient Medications Prior to Visit  Medication Sig   apixaban (ELIQUIS) 5 MG TABS tablet Take 1 tablet (5 mg total) by mouth 2 (two) times daily.   atorvastatin (LIPITOR) 40 MG tablet Take 1 tablet (40 mg total) by mouth daily.   digoxin (LANOXIN) 0.125 MG tablet Take 1 tablet (0.125 mg total) by mouth daily.   diltiazem (CARDIZEM CD) 240 MG 24 hr capsule Take 1 capsule (240 mg total) by mouth daily.   flecainide (TAMBOCOR) 50 MG tablet Take 1 tablet (50 mg total) by mouth 2 (two) times daily.   folic acid (FOLVITE) 1 MG tablet Take 1 tablet (1 mg total) by mouth daily.   hydrOXYzine (ATARAX) 10 MG tablet TAKE ONE TABLET BY MOUTH THREE TIMES A DAY AS NEEDED   lamoTRIgine (LAMICTAL) 100 MG tablet TAKE ONE TABLET BY MOUTH TWICE A DAY   metoprolol tartrate (LOPRESSOR) 50 MG tablet Take 1 tablet (50 mg total) by mouth 2 (two) times daily.   thiamine (VITAMIN B-1) 100 MG  tablet Take 1 tablet (100 mg total) by mouth daily.   traZODone (DESYREL) 100 MG tablet Take 1 tablet (100 mg total) by mouth at bedtime as needed for sleep. Please schedule office visit before any future refill.   No facility-administered medications prior to visit.    Review of Systems  {Labs  Heme  Chem  Endocrine  Serology  Results Review (optional):23779}   Objective    There were no vitals taken for this visit. {Show previous vital signs (optional):23777}  Physical Exam  ***  No results found for any visits on 09/11/22.  Assessment & Plan     ***  No follow-ups on file.      {provider attestation***:1}   Lavon Paganini, MD  [facility] [phone] (phone) [phone] (fax)  [place]

## 2022-09-11 ENCOUNTER — Inpatient Hospital Stay: Payer: Self-pay | Admitting: Family Medicine

## 2022-09-11 ENCOUNTER — Telehealth: Payer: Self-pay | Admitting: Family Medicine

## 2022-09-12 ENCOUNTER — Other Ambulatory Visit: Payer: Self-pay | Admitting: Cardiovascular Disease

## 2022-09-12 DIAGNOSIS — I480 Paroxysmal atrial fibrillation: Secondary | ICD-10-CM

## 2022-09-12 NOTE — Progress Notes (Signed)
Orders for TEE and cardioversion

## 2022-09-16 ENCOUNTER — Telehealth: Payer: Self-pay | Admitting: Cardiovascular Disease

## 2022-09-16 ENCOUNTER — Encounter: Payer: Self-pay | Admitting: Family Medicine

## 2022-09-16 ENCOUNTER — Ambulatory Visit (INDEPENDENT_AMBULATORY_CARE_PROVIDER_SITE_OTHER): Payer: 59 | Admitting: Family Medicine

## 2022-09-16 ENCOUNTER — Other Ambulatory Visit: Payer: Self-pay | Admitting: Family Medicine

## 2022-09-16 VITALS — BP 116/66 | HR 96 | Temp 97.8°F | Resp 16 | Wt 185.5 lb

## 2022-09-16 DIAGNOSIS — E782 Mixed hyperlipidemia: Secondary | ICD-10-CM

## 2022-09-16 DIAGNOSIS — Z8673 Personal history of transient ischemic attack (TIA), and cerebral infarction without residual deficits: Secondary | ICD-10-CM

## 2022-09-16 DIAGNOSIS — I4891 Unspecified atrial fibrillation: Secondary | ICD-10-CM | POA: Diagnosis not present

## 2022-09-16 DIAGNOSIS — I160 Hypertensive urgency: Secondary | ICD-10-CM | POA: Diagnosis not present

## 2022-09-16 MED ORDER — TRAZODONE HCL 100 MG PO TABS: 100.0000 mg | ORAL_TABLET | Freq: Every evening | ORAL | 0 refills | Status: DC | PRN

## 2022-09-16 MED ORDER — LAMOTRIGINE 100 MG PO TABS
100.0000 mg | ORAL_TABLET | Freq: Two times a day (BID) | ORAL | 5 refills | Status: DC
  Filled 2022-11-17 – 2022-12-02 (×2): qty 60, 30d supply, fill #0
  Filled 2022-12-29: qty 60, 30d supply, fill #1
  Filled 2023-01-27: qty 60, 30d supply, fill #2
  Filled 2023-02-26: qty 60, 30d supply, fill #3
  Filled 2023-03-25: qty 60, 30d supply, fill #4

## 2022-09-16 NOTE — Telephone Encounter (Signed)
Requested Prescriptions  Pending Prescriptions Disp Refills  . clonazePAM (KLONOPIN) 0.5 MG tablet 30 tablet     Sig: Take 1 tablet (0.5 mg total) by mouth 2 (two) times daily as needed for anxiety.     Not Delegated - Psychiatry: Anxiolytics/Hypnotics 2 Failed - 09/16/2022  4:23 PM      Failed - This refill cannot be delegated      Passed - Urine Drug Screen completed in last 360 days      Passed - Patient is not pregnant      Passed - Valid encounter within last 6 months    Recent Outpatient Visits          Today History of embolic stroke   [facility], Dionne Bucy, MD   10 months ago Encounter for annual physical exam   [facility], Dionne Bucy, MD   11 months ago Anxiety and depression   [facility] [place], Dionne Bucy, MD   1 year ago Acute non-recurrent pansinusitis   [facility] [place], Dionne Bucy, MD   2 years ago Bipolar disorder, current episode mixed, moderate ([place])   [facility], Dionne Bucy, MD             . traZODone (DESYREL) 100 MG tablet 90 tablet 0    Sig: Take 1 tablet (100 mg total) by mouth at bedtime as needed for sleep. Please schedule office visit before any future refill.     Psychiatry: Antidepressants - Serotonin Modulator Passed - 09/16/2022  4:23 PM      Passed - Valid encounter within last 6 months    Recent Outpatient Visits          Today History of embolic stroke   [facility], Dionne Bucy, MD   10 months ago Encounter for annual physical exam   [facility], Dionne Bucy, MD   11 months ago Anxiety and depression   [facility] [place], Dionne Bucy, MD   1 year ago Acute non-recurrent pansinusitis   [facility] [place], Dionne Bucy, MD   2 years ago Bipolar disorder, current episode mixed, moderate ([place])   [facility], Dionne Bucy, MD              . lamoTRIgine (LAMICTAL) 100 MG tablet 60 tablet 5    Sig: Take 1 tablet (100 mg total) by mouth 2 (two) times daily.     Neurology:  Anticonvulsants - lamotrigine Passed - 09/16/2022  4:23 PM      Passed - Cr in normal range and within 360 days    Creatinine, Ser  Date Value Ref Range Status  09/08/2022 0.81 0.57 - 1.00 mg/dL Final         Passed - ALT in normal range and within 360 days    ALT  Date Value Ref Range Status  08/26/2022 19 0 - 44 U/L Final         Passed - AST in normal range and within 360 days    AST  Date Value Ref Range Status  08/26/2022 23 15 - 41 U/L Final         Passed - Completed PHQ-2 or PHQ-9 in the last 360 days      Passed - Valid encounter within last 12 months    Recent Outpatient Visits          Today History of embolic stroke   [facility] [place], Dionne Bucy, MD   10 months ago Encounter for annual physical exam   [facility], Dionne Bucy, MD   11 months ago Anxiety and depression   [facility] [place], Dionne Bucy, MD   1 year ago Acute non-recurrent pansinusitis   [facility] [place], Dionne Bucy, MD   2 years ago Bipolar disorder, current episode mixed, moderate [facility])   [facility], Dionne Bucy, MD

## 2022-09-16 NOTE — Assessment & Plan Note (Signed)
Recent embolic strokes secondary to A-fib She had TNK with resolution of deficits She is no longer followed by neuro or therapies She is back to baseline without deficits Continue Eliquis for further stroke prevention Blood pressure well controlled Continue statin

## 2022-09-16 NOTE — Assessment & Plan Note (Signed)
In normal sinus rhythm today She does continue to have symptomatic episodes of A-fib throughout the day Rate does seem well controlled, so no changes to medications at this time Continue to follow with cardiology May have cardioversion in the future Continue Eliquis for stroke prevention

## 2022-09-16 NOTE — Telephone Encounter (Signed)
Pt is calling to cancel her surgery for 10/19 with Dr. Marlou Porch, but would like a call back to discuss why and when she can reschedule. Requesting call back.

## 2022-09-16 NOTE — Telephone Encounter (Signed)
Medication Refill - Medication:  traZODone (DESYREL) 100 MG tablet clonazePAM (KLONOPIN) 0.5 MG tablet lamoTRIgine (LAMICTAL) 100 MG tablet   Has the patient contacted their pharmacy? No. (Agent: If no, request that the patient contact the pharmacy for the refill. If patient does not wish to contact the pharmacy document the reason why and proceed with request.) (Agent: If yes, when and what did the pharmacy advise?)  Preferred Pharmacy (with phone number or street name):  [facility] [id number], [place] Phone:  [phone]  Fax:  [phone]     Has the patient been seen for an appointment in the last year OR does the patient have an upcoming appointment? Yes.    Agent: Please be advised that RX refills may take up to 3 business days. We ask that you follow-up with your pharmacy.

## 2022-09-16 NOTE — Assessment & Plan Note (Signed)
Now resolved BP is well controlled No changes in medication

## 2022-09-16 NOTE — Telephone Encounter (Signed)
Requested medication (s) are due for refill today: No  Requested medication (s) are on the active medication list: yes    Last refill: 09/15/22  Amount not specified  Future visit scheduled No  Notes to clinic:Historical provider, cannot refuse non-delegated meds.  Requested Prescriptions  Pending Prescriptions Disp Refills   clonazePAM (KLONOPIN) 0.5 MG tablet 30 tablet     Sig: Take 1 tablet (0.5 mg total) by mouth 2 (two) times daily as needed for anxiety.     Not Delegated - Psychiatry: Anxiolytics/Hypnotics 2 Failed - 09/16/2022  4:23 PM      Failed - This refill cannot be delegated      Passed - Urine Drug Screen completed in last 360 days      Passed - Patient is not pregnant      Passed - Valid encounter within last 6 months    Recent Outpatient Visits           Today History of embolic stroke   [facility], Dionne Bucy, MD   10 months ago Encounter for annual physical exam   [facility], Dionne Bucy, MD   11 months ago Anxiety and depression   [facility] [place], Dionne Bucy, MD   1 year ago Acute non-recurrent pansinusitis   [facility] [place], Dionne Bucy, MD   2 years ago Bipolar disorder, current episode mixed, moderate ([place])   [facility], Dionne Bucy, MD              Signed Prescriptions Disp Refills   traZODone (DESYREL) 100 MG tablet 90 tablet 0    Sig: Take 1 tablet (100 mg total) by mouth at bedtime as needed for sleep. Please schedule office visit before any future refill.     Psychiatry: Antidepressants - Serotonin Modulator Passed - 09/16/2022  4:23 PM      Passed - Valid encounter within last 6 months    Recent Outpatient Visits           Today History of embolic stroke   [facility], Dionne Bucy, MD   10 months ago Encounter for annual physical exam   [facility], Dionne Bucy, MD   11 months  ago Anxiety and depression   [facility] [place], Dionne Bucy, MD   1 year ago Acute non-recurrent pansinusitis   [facility] [place], Dionne Bucy, MD   2 years ago Bipolar disorder, current episode mixed, moderate ([place])   [facility], Dionne Bucy, MD               lamoTRIgine (LAMICTAL) 100 MG tablet 60 tablet 5    Sig: Take 1 tablet (100 mg total) by mouth 2 (two) times daily.     Neurology:  Anticonvulsants - lamotrigine Passed - 09/16/2022  4:23 PM      Passed - Cr in normal range and within 360 days    Creatinine, Ser  Date Value Ref Range Status  09/08/2022 0.81 0.57 - 1.00 mg/dL Final         Passed - ALT in normal range and within 360 days    ALT  Date Value Ref Range Status  08/26/2022 19 0 - 44 U/L Final         Passed - AST in normal range and within 360 days    AST  Date Value Ref Range Status  08/26/2022 23 15 - 41 U/L Final  Passed - Completed PHQ-2 or PHQ-9 in the last 360 days      Passed - Valid encounter within last 12 months    Recent Outpatient Visits           Today History of embolic stroke   [facility], Dionne Bucy, MD   10 months ago Encounter for annual physical exam   [facility], Dionne Bucy, MD   11 months ago Anxiety and depression   [facility] [place], Dionne Bucy, MD   1 year ago Acute non-recurrent pansinusitis   [facility] [place], Dionne Bucy, MD   2 years ago Bipolar disorder, current episode mixed, moderate [facility])   [facility] Dionne Bucy, MD

## 2022-09-16 NOTE — Assessment & Plan Note (Signed)
Reviewed lipid panel from hospitalization She is now on statin and tolerating it well We will continue atorvastatin at current dose

## 2022-09-16 NOTE — Progress Notes (Signed)
I,Sulibeya S Dimas,acting as a scribe for Kathleen Paganini, MD.,have documented all relevant documentation on the behalf of Kathleen Paganini, MD,as directed by  Kathleen Paganini, MD while in the presence of Kathleen Paganini, MD.     Established patient visit   Patient: Kathleen Perkins   DOB: 1961-03-12   61 y.o. Female  MRN: [id number] Visit Date: 09/16/2022  Today's healthcare provider: Lavon Paganini, MD   Chief Complaint  Patient presents with   Hospitalization Follow-up   Subjective    HPI  Follow up Hospitalization  Patient was admitted to [facility] on 08/26/22 and discharged on 08/29/22. She was treated for embolic strokes from unknown A fib.  Also with a fib with RVR during hospitalization. S/p TNK. No remaining deficits. Not seeing Neuro . Treatment for this included see note.  She reports excellent compliance with treatment. She reports this condition is improved.  Plugged into Cardiology - seeing Dr Acie Fredrickson. Having TEE Thursday and then plan for Cardioversion in the future. Taking Eliquis regularly.  Taking Diltiazem, metoprolol and digoxin with good compliance.  Found to have anaphylaxis to IV contrast.  ----------------------------------------------------------------------------------------   Medications: Outpatient Medications Prior to Visit  Medication Sig   apixaban (ELIQUIS) 5 MG TABS tablet Take 1 tablet (5 mg total) by mouth 2 (two) times daily.   atorvastatin (LIPITOR) 40 MG tablet Take 1 tablet (40 mg total) by mouth daily.   clonazePAM (KLONOPIN) 0.5 MG tablet Take 0.5 mg by mouth 2 (two) times daily as needed for anxiety.   digoxin (LANOXIN) 0.125 MG tablet Take 1 tablet (0.125 mg total) by mouth daily.   diltiazem (CARDIZEM CD) 240 MG 24 hr capsule Take 1 capsule (240 mg total) by mouth daily.   flecainide (TAMBOCOR) 50 MG tablet Take 1 tablet (50 mg total) by mouth 2 (two) times daily.   folic acid (FOLVITE) 1 MG tablet Take 1 tablet (1 mg total)  by mouth daily.   hydrOXYzine (ATARAX) 10 MG tablet TAKE ONE TABLET BY MOUTH THREE TIMES A DAY AS NEEDED   lamoTRIgine (LAMICTAL) 100 MG tablet TAKE ONE TABLET BY MOUTH TWICE A DAY   metoprolol tartrate (LOPRESSOR) 50 MG tablet Take 1 tablet (50 mg total) by mouth 2 (two) times daily.   thiamine (VITAMIN B-1) 100 MG tablet Take 1 tablet (100 mg total) by mouth daily.   traZODone (DESYREL) 100 MG tablet Take 1 tablet (100 mg total) by mouth at bedtime as needed for sleep. Please schedule office visit before any future refill. (Patient taking differently: Take 100 mg by mouth at bedtime. Please schedule office visit before any future refill.)   No facility-administered medications prior to visit.    Review of Systems  Constitutional:  Negative for appetite change and fatigue.  Respiratory:  Positive for shortness of breath.   Cardiovascular:  Positive for chest pain and palpitations. Negative for leg swelling.  Gastrointestinal:  Negative for abdominal pain, diarrhea, nausea and vomiting.       Objective    BP 116/66 (BP Location: Left Arm, Patient Position: Sitting, Cuff Size: Large)   Pulse 96   Temp 97.8 F (36.6 C) (Oral)   Resp 16   Wt 185 lb 8 oz (84.1 kg)   SpO2 99%   BMI 31.84 kg/m  BP Readings from Last 3 Encounters:  09/16/22 116/66  09/08/22 135/82  08/29/22 130/88   Wt Readings from Last 3 Encounters:  09/16/22 185 lb 8 oz (84.1 kg)  09/08/22 183 lb (83 kg)  08/28/22 179 lb 14.3 oz (81.6 kg)      Physical Exam Vitals reviewed.  Constitutional:      General: She is not in acute distress.    Appearance: Normal appearance. She is well-developed. She is not diaphoretic.  HENT:     Head: Normocephalic and atraumatic.  Eyes:     General: No scleral icterus.    Conjunctiva/sclera: Conjunctivae normal.  Neck:     Thyroid: No thyromegaly.  Cardiovascular:     Rate and Rhythm: Normal rate and regular rhythm.     Pulses: Normal pulses.     Heart sounds: Normal  heart sounds. No murmur heard. Pulmonary:     Effort: Pulmonary effort is normal. No respiratory distress.     Breath sounds: Normal breath sounds. No wheezing, rhonchi or rales.  Musculoskeletal:     Cervical back: Neck supple.     Right lower leg: No edema.     Left lower leg: No edema.  Lymphadenopathy:     Cervical: No cervical adenopathy.  Skin:    General: Skin is warm and dry.     Findings: No rash.  Neurological:     General: No focal deficit present.     Mental Status: She is alert and oriented to person, place, and time. Mental status is at baseline.     Sensory: No sensory deficit.     Motor: No weakness.     Gait: Gait normal.  Psychiatric:        Mood and Affect: Mood normal.        Behavior: Behavior normal.       No results found for any visits on 09/16/22.  Assessment & Plan     Problem List Items Addressed This Visit       Cardiovascular and Mediastinum   Atrial fibrillation with RVR (HCC)    In normal sinus rhythm today She does continue to have symptomatic episodes of A-fib throughout the day Rate does seem well controlled, so no changes to medications at this time Continue to follow with cardiology May have cardioversion in the future Continue Eliquis for stroke prevention      Hypertensive urgency    Now resolved BP is well controlled No changes in medication        Other   Hyperlipidemia    Reviewed lipid panel from hospitalization She is now on statin and tolerating it well We will continue atorvastatin at current dose      History of embolic stroke - Primary    Recent embolic strokes secondary to A-fib She had TNK with resolution of deficits She is no longer followed by neuro or therapies She is back to baseline without deficits Continue Eliquis for further stroke prevention Blood pressure well controlled Continue statin        Return in about 2 months (around 11/16/2022) for CPE.      I, Kathleen Paganini, MD, have  reviewed all documentation for this visit. The documentation on 09/16/22 for the exam, diagnosis, procedures, and orders are all accurate and complete.   Kathleen Perkins, Dionne Bucy, MD, MPH [facility]

## 2022-09-17 NOTE — Telephone Encounter (Signed)
I called and spoke with patient who states that she has to cancel TEE because she doesn't have insurance. She states that her insurance was supposed to have been carried forward and it wasn't. She starts new job with [employer] next week and states she intends to proceed next month or no later than December.

## 2022-09-17 NOTE — Progress Notes (Signed)
Called to speak to patient about her scheduled cardioversion. Per patient she cancelled due to not having insurance. Pt understands that she will need to call the office to reschedule.

## 2022-09-18 ENCOUNTER — Ambulatory Visit (HOSPITAL_COMMUNITY): Admission: RE | Admit: 2022-09-18 | Payer: BC Managed Care – PPO | Source: Home / Self Care | Admitting: Cardiology

## 2022-09-18 ENCOUNTER — Telehealth: Payer: Self-pay | Admitting: Cardiovascular Disease

## 2022-09-18 ENCOUNTER — Encounter (HOSPITAL_COMMUNITY): Admission: RE | Payer: Self-pay | Source: Home / Self Care

## 2022-09-18 SURGERY — ECHOCARDIOGRAM, TRANSESOPHAGEAL
Anesthesia: Monitor Anesthesia Care

## 2022-09-18 MED ORDER — CLONAZEPAM 0.5 MG PO TABS: 0.5000 mg | ORAL_TABLET | Freq: Two times a day (BID) | ORAL | 0 refills | Status: DC | PRN

## 2022-09-18 NOTE — Telephone Encounter (Signed)
Pt c/o medication issue:  1. Name of Medication: Flecainide  2. How are you currently taking this medication (dosage and times per day)?   3. Are you having a reaction (difficulty breathing--STAT)?   4. What is your medication issue? She was supposed to have started taking this medicine after she had her procedure at [facility]. She did not have the  procedure. Her question is does she need to start taking this medicine, since she did not have the procedure?

## 2022-09-19 NOTE — Telephone Encounter (Signed)
Reached out to patient with below recommendations. She states that she has [facility] and will check to verify she's in NSR and if so, begin the Flecainide '50mg'$  BID. Will await call from Dr Acie Fredrickson on need for OV due to no insurance. Nahser, Wonda Cheng, MD  You; Darrell Jewel, RN 52 minutes ago (7:55 AM)    I agree with Saddie Benders , RN that she should start flecainide after she converts to NSR.  We need to make sure she is continuing her eliquis.     She cancelled her follow up appt  I will call her and discuss this with her   PN    [place], Stephanie Coup, RN  You; Nahser, Wonda Cheng, MD 18 hours ago (2:31 PM)    From OV note:  If she converts to sinus rhythm before or after her scheduled cardioversion,  she is to start taking Flecainide 50 mg BID.

## 2022-09-21 ENCOUNTER — Other Ambulatory Visit: Payer: Self-pay | Admitting: Family Medicine

## 2022-09-21 DIAGNOSIS — I499 Cardiac arrhythmia, unspecified: Secondary | ICD-10-CM

## 2022-09-22 ENCOUNTER — Ambulatory Visit: Payer: BC Managed Care – PPO | Admitting: Cardiovascular Disease

## 2022-09-23 ENCOUNTER — Encounter: Payer: Self-pay | Admitting: Cardiovascular Disease

## 2022-09-23 ENCOUNTER — Ambulatory Visit: Payer: BC Managed Care – PPO | Admitting: Cardiovascular Disease

## 2022-09-23 NOTE — Progress Notes (Unsigned)
Kathleen Perkins is seen today for follow-up of her paroxysmal atrial fibrillation and stroke. She was started on Eliquis 3 or so weeks ago.  She started flecainide several days ago.  She is seen back today for a quick visit and to assess her heart rhythm.  Has had some fatigue   On Metoprol XL  50 BID Digoxin 0.125 a day  Cardiazem CD       Wt is 185.6 HR is 117  BP is 138/88   Kardia monitor shows  Atrial flutter at 115  Did not slow with carotid sinus massage   Will have her run by in 4 weeks   Important to continue eliquis 5 BID    Mertie Moores, MD  09/23/2022 4:52 PM    [facility] [address]  [place], [place]  [address] Phone: [phone]; Fax: [phone]

## 2022-09-24 ENCOUNTER — Ambulatory Visit: Payer: BC Managed Care – PPO | Admitting: Cardiovascular Disease

## 2022-10-03 ENCOUNTER — Ambulatory Visit: Payer: Self-pay

## 2022-10-03 NOTE — Telephone Encounter (Signed)
Summary: med change   Pt states she has not been sleeping at night, and would like to increase her traZODone (DESYREL) 100 MG tablet.  Pt would like to increase tjis Pt would also like to decrease her lamoTRIgine (LAMICTAL) 100 MG tablet. Pt has cpe on Jan 12.  But hopes the dr will consider these request prior to then.  Pt just started new job at [employer].      Called pt - LMOMTCB

## 2022-10-03 NOTE — Telephone Encounter (Signed)
  Chief Complaint: Medication adjustments needed Symptoms: Not sleeping Frequency: ongoing Pertinent Negatives: Patient denies  Disposition: '[]'$ ED /'[]'$ Urgent Care (no appt availability in office) / '[x]'$ Appointment(In office/virtual)/ '[]'$  [facility]/ '[]'$ Home Care/ '[]'$ Refused Recommended Disposition /'[]'$ [facility]/ '[]'$  Follow-up with PCP Additional Notes: PT feels that her medication dosages need adjustment.    Summary: med change   Pt states she has not been sleeping at night, and would like to increase her traZODone (DESYREL) 100 MG tablet.  Pt would like to increase tjis Pt would also like to decrease her lamoTRIgine (LAMICTAL) 100 MG tablet. Pt has cpe on Jan 12.  But hopes the dr will consider these request prior to then.  Pt just started new job at [employer].     Reason for Disposition  Prescription request for new medicine (not a refill)  Answer Assessment - Initial Assessment Questions 1. NAME of MEDICINE: "What medicine(s) are you calling about?"     Lamictal and Trazodone. 2. QUESTION: "What is your question?" (e.g., double dose of medicine, side effect)     Adjustment needed 3. PRESCRIBER: "Who prescribed the medicine?" Reason: if prescribed by specialist, call should be referred to that group.      4. SYMPTOMS: "Do you have any symptoms?" If Yes, ask: "What symptoms are you having?"  "How bad are the symptoms (e.g., mild, moderate, severe)      5. PREGNANCY:  "Is there any chance that you are pregnant?" "When was your last menstrual period?"  Protocols used: Medication Question Call-A-AH

## 2022-10-03 NOTE — Telephone Encounter (Signed)
Summary: med change   Pt states she has not been sleeping at night, and would like to increase her traZODone (DESYREL) 100 MG tablet.  Pt would like to increase tjis Pt would also like to decrease her lamoTRIgine (LAMICTAL) 100 MG tablet. Pt has cpe on Jan 12.  But hopes the dr will consider these request prior to then.  Pt just started new job at [employer].      Called pt  - LMOMTCB

## 2022-10-06 ENCOUNTER — Telehealth: Payer: Self-pay | Admitting: *Deleted

## 2022-10-06 ENCOUNTER — Ambulatory Visit: Payer: Self-pay | Admitting: Family Medicine

## 2022-10-06 ENCOUNTER — Telehealth: Payer: Self-pay | Admitting: Cardiovascular Disease

## 2022-10-06 NOTE — Telephone Encounter (Signed)
Pt reports "In and out of A-Fib all weekend." HR sustained at times 130's. States she will cancel her appt with Dr. Brita Romp for this AM and see her cardiologist first  for med management, then reschedule with PCP. States she is certain she will be able to speak with him "Old friend."  Assured pt NT would route to practice for PCPs review. Advised ED for worsening symptoms. 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

## 2022-10-06 NOTE — Telephone Encounter (Signed)
Noted  

## 2022-10-06 NOTE — Telephone Encounter (Signed)
Please Review

## 2022-10-06 NOTE — Telephone Encounter (Signed)
Patient c/o Palpitations:  High priority if patient c/o lightheadedness, shortness of breath, or chest pain  How long have you had palpitations/irregular HR/ Afib? Are you having the symptoms now? Started measuring it this weekend on and off, does not think she is in Afib now   Are you currently experiencing lightheadedness, SOB or CP? No   Do you have a history of afib (atrial fibrillation) or irregular heart rhythm? Yes  Have you checked your BP or HR? (document readings if available): No   Are you experiencing any other symptoms? Anxiety   Patient is requesting a callback from Judson Roch regarding this. She states she just started measuring the Afib with her kardiamobile. She is wanting to be worked in for an appointment with Dr. Acie Fredrickson only.

## 2022-10-07 ENCOUNTER — Telehealth: Payer: Self-pay | Admitting: Cardiovascular Disease

## 2022-10-07 MED ORDER — FLECAINIDE ACETATE 100 MG PO TABS: 100.0000 mg | ORAL_TABLET | Freq: Two times a day (BID) | ORAL | 3 refills | Status: DC

## 2022-10-07 NOTE — Telephone Encounter (Signed)
-----   Message from Thayer Headings, MD sent at 10/06/2022  6:23 PM EST ----- Hi Calven Gilkes, and good morning    Please schedule Kathleen Perkins for an appt on Nov. 21 at 11:40 - office visit and ECG   Increase her Flecainide to 100 mg BID DC digoxin  Continue eliquis 5 mg BID   Thanks      PN

## 2022-10-07 NOTE — Telephone Encounter (Signed)
Patient informed of medication change and appt by MD directly. Did call patient today just for full circle communication, but no answer. Left message asking that she call office back.

## 2022-10-08 NOTE — Telephone Encounter (Signed)
Medication changes and appointment already made directly with Dr Acie Fredrickson. Pt aware.

## 2022-10-14 ENCOUNTER — Encounter: Payer: Self-pay | Admitting: Cardiovascular Disease

## 2022-10-14 ENCOUNTER — Other Ambulatory Visit: Payer: Self-pay | Admitting: Cardiovascular Disease

## 2022-10-14 ENCOUNTER — Telehealth: Payer: Self-pay | Admitting: Cardiovascular Disease

## 2022-10-14 DIAGNOSIS — Z5181 Encounter for therapeutic drug level monitoring: Secondary | ICD-10-CM

## 2022-10-14 DIAGNOSIS — I480 Paroxysmal atrial fibrillation: Secondary | ICD-10-CM

## 2022-10-14 DIAGNOSIS — R0609 Other forms of dyspnea: Secondary | ICD-10-CM

## 2022-10-14 MED ORDER — PROPRANOLOL HCL 10 MG PO TABS
ORAL_TABLET | ORAL | 11 refills | Status: DC
  Filled 2022-11-17: qty 117, 29d supply, fill #0
  Filled 2022-11-19: qty 3, 1d supply, fill #0
  Filled 2023-07-02: qty 120, 30d supply, fill #0

## 2022-10-14 NOTE — Telephone Encounter (Signed)
Per Dr Acie Fredrickson via secure chat 10/14/22:  see if we can schedule Hope for GXT ( for flecainide monitoring .she should stay on all meds ( including flecainide , and on metoprolol ) tomorrow afternoon. can cancel her appt with me. she needs propranolol 10 mg tabs to be used QID PRN breakthrough afib . thanks so much.   Patient sent instructions via MyChart. Propranolol sent to pharmacy on file. Message routed to [facility] for scheduling. Appt with Dr Acie Fredrickson cancelled at this time.

## 2022-10-17 ENCOUNTER — Telehealth: Payer: Self-pay | Admitting: Cardiovascular Disease

## 2022-10-17 NOTE — Telephone Encounter (Signed)
Cardiology phone note  Talked with [facility] via phone yesterday evening .  We increased her Flecainide from 50 mg BID to 100 mg BID last week to attempt to keep her in sinus rhythm for more of the day.  After increasing the Flecainide, she started feeling poorly,  developed blurry vision.  She was barely able to make it through her work day   She held her flecainide yesterday morning and started feeling better . She has continued to have shortness of breath with exertion Kardia mobile showed Afib with HR of 120s (Emailed me the tracing)  We reduced the Flecainide to 50 mg po BID  She is scheduled to have a GXT on 10/21/22.  At the time of her stroke, she had a specific stroke insurance policy in place. I agreed to fill out the paperwork for her.  She will drop off the forms next week when she comes for her treadmill   We discussed that the long term plan is for Afib ablation . She has documented episodes of sinus rhythm so she does not need to have a cardioversion.    Mertie Moores, MD  10/17/2022 7:36 AM    [address],  [place], [place]  [address] Phone: [phone]; Fax: [phone]

## 2022-10-21 ENCOUNTER — Ambulatory Visit: Payer: BC Managed Care – PPO | Admitting: Cardiovascular Disease

## 2022-10-21 ENCOUNTER — Ambulatory Visit: Payer: Self-pay

## 2022-10-21 ENCOUNTER — Ambulatory Visit: Payer: Self-pay | Admitting: Physician Assistant

## 2022-10-21 NOTE — Progress Notes (Deleted)
      Established patient visit   Patient: Kathleen Perkins   DOB: January 14, 1961   61 y.o. Female  MRN: [id number] Visit Date: 10/21/2022  Today's healthcare provider: Mardene Speak, PA-C   No chief complaint on file.  Subjective    HPI  ***  Medications: Outpatient Medications Prior to Visit  Medication Sig   flecainide (TAMBOCOR) 100 MG tablet Take 1 tablet (100 mg total) by mouth 2 (two) times daily.   apixaban (ELIQUIS) 5 MG TABS tablet Take 1 tablet (5 mg total) by mouth 2 (two) times daily.   atorvastatin (LIPITOR) 40 MG tablet Take 1 tablet (40 mg total) by mouth daily.   clonazePAM (KLONOPIN) 0.5 MG tablet Take 1 tablet (0.5 mg total) by mouth 2 (two) times daily as needed for anxiety.   diltiazem (CARDIZEM CD) 240 MG 24 hr capsule Take 1 capsule (240 mg total) by mouth daily.   folic acid (FOLVITE) 1 MG tablet Take 1 tablet (1 mg total) by mouth daily.   hydrOXYzine (ATARAX) 10 MG tablet TAKE ONE TABLET BY MOUTH THREE TIMES A DAY AS NEEDED   lamoTRIgine (LAMICTAL) 100 MG tablet Take 1 tablet (100 mg total) by mouth 2 (two) times daily.   metoprolol tartrate (LOPRESSOR) 50 MG tablet Take 1 tablet (50 mg total) by mouth 2 (two) times daily.   propranolol (INDERAL) 10 MG tablet Take 1 tablet by mouth up to four times daily as needed for breakthrough episodes of Atrial Fibrillation   thiamine (VITAMIN B-1) 100 MG tablet Take 1 tablet (100 mg total) by mouth daily.   traZODone (DESYREL) 100 MG tablet Take 1 tablet (100 mg total) by mouth at bedtime as needed for sleep. Please schedule office visit before any future refill.   No facility-administered medications prior to visit.    Review of Systems  {Labs  Heme  Chem  Endocrine  Serology  Results Review (optional):23779}   Objective    There were no vitals taken for this visit. {Show previous vital signs (optional):23777}  Physical Exam  ***  No results found for any visits on 10/21/22.  Assessment & Plan      ***  No follow-ups on file.      {provider attestation***:1}   Mardene Speak, Hershal Coria  [facility] [phone] (phone) [phone] (fax)  [place]

## 2022-11-04 ENCOUNTER — Telehealth: Payer: Self-pay | Admitting: Cardiovascular Disease

## 2022-11-04 ENCOUNTER — Ambulatory Visit: Payer: 59 | Attending: Cardiovascular Disease

## 2022-11-04 DIAGNOSIS — I480 Paroxysmal atrial fibrillation: Secondary | ICD-10-CM

## 2022-11-04 DIAGNOSIS — R0609 Other forms of dyspnea: Secondary | ICD-10-CM | POA: Diagnosis not present

## 2022-11-04 MED ORDER — METOPROLOL TARTRATE 25 MG PO TABS
25.0000 mg | ORAL_TABLET | Freq: Two times a day (BID) | ORAL | 3 refills | Status: DC
  Filled 2022-11-17 – 2022-12-29 (×2): qty 180, 90d supply, fill #0

## 2022-11-04 NOTE — Telephone Encounter (Signed)
Pt seen today for ETT. Katy consulted with Nahser (DOD) d/t pt experiencing major difficulty completing longer than 45 seconds. Nahser went to assess patient. Verbal order given in clinic to decrease Metoprolol Tartrate to '25mg'$  twice daily.

## 2022-11-08 ENCOUNTER — Other Ambulatory Visit: Payer: Self-pay | Admitting: Family Medicine

## 2022-11-08 LAB — EXERCISE TOLERANCE TEST
Angina Index: 0
Duke Treadmill Score: 8
Estimated workload: 3.4
Exercise duration (min): 8 min
Exercise duration (sec): 0 s
MPHR: 159 {beats}/min
Peak HR: 79 {beats}/min
Percent HR: 49 %
Rest HR: 53 {beats}/min
ST Depression (mm): 0 mm

## 2022-11-14 ENCOUNTER — Telehealth: Payer: Self-pay | Admitting: Cardiovascular Disease

## 2022-11-14 ENCOUNTER — Other Ambulatory Visit: Payer: Self-pay

## 2022-11-14 MED ORDER — DILTIAZEM HCL ER COATED BEADS 120 MG PO CP24
120.0000 mg | ORAL_CAPSULE | Freq: Every day | ORAL | 3 refills | Status: DC
  Filled 2022-11-14: qty 90, 90d supply, fill #0
  Filled 2023-01-23 – 2023-01-27 (×2): qty 90, 90d supply, fill #1
  Filled 2023-05-08: qty 90, 90d supply, fill #2
  Filled 2023-08-19: qty 90, 90d supply, fill #3

## 2022-11-14 MED ORDER — DILTIAZEM HCL ER COATED BEADS 120 MG PO CP24: 120.0000 mg | ORAL_CAPSULE | Freq: Every day | ORAL | 3 refills | Status: DC

## 2022-11-14 MED ORDER — FLECAINIDE ACETATE 50 MG PO TABS: 50.0000 mg | ORAL_TABLET | Freq: Two times a day (BID) | ORAL | 3 refills | Status: DC

## 2022-11-14 NOTE — Telephone Encounter (Signed)
While here in office for paperwork, pt spoke directly with Dr Acie Fredrickson who recommended that we decrease her Flecainide to '50mg'$  twice daily, Cardizem CD to '120mg'$  daily and remain on Metoprolol Tartrate '25mg'$  BID. Pt will need f/u in Jan/Feb.

## 2022-11-14 NOTE — Telephone Encounter (Signed)
Patient stated she will be picking up papers from [place] today and dropping off an item.

## 2022-11-17 ENCOUNTER — Other Ambulatory Visit: Payer: Self-pay

## 2022-11-17 MED ORDER — FLECAINIDE ACETATE 50 MG PO TABS
50.0000 mg | ORAL_TABLET | Freq: Two times a day (BID) | ORAL | 3 refills | Status: DC
  Filled 2022-12-02: qty 180, 90d supply, fill #0

## 2022-11-17 MED ORDER — DIGOXIN 125 MCG PO TABS
0.1250 mg | ORAL_TABLET | Freq: Every day | ORAL | 1 refills | Status: DC
  Filled 2022-11-17: qty 30, 30d supply, fill #0

## 2022-11-17 MED ORDER — FLECAINIDE ACETATE 100 MG PO TABS: 100.0000 mg | ORAL_TABLET | Freq: Two times a day (BID) | ORAL | 3 refills | Status: DC

## 2022-11-17 MED FILL — Hydroxyzine HCl Tab 10 MG: ORAL | 30 days supply | Qty: 30 | Fill #0 | Status: CN

## 2022-11-18 ENCOUNTER — Other Ambulatory Visit: Payer: Self-pay

## 2022-11-19 ENCOUNTER — Other Ambulatory Visit: Payer: Self-pay

## 2022-11-30 ENCOUNTER — Other Ambulatory Visit: Payer: Self-pay

## 2022-12-02 ENCOUNTER — Telehealth: Payer: Self-pay | Admitting: Cardiovascular Disease

## 2022-12-02 ENCOUNTER — Other Ambulatory Visit: Payer: Self-pay

## 2022-12-02 DIAGNOSIS — I480 Paroxysmal atrial fibrillation: Secondary | ICD-10-CM

## 2022-12-02 MED ORDER — APIXABAN 5 MG PO TABS
5.0000 mg | ORAL_TABLET | Freq: Two times a day (BID) | ORAL | 2 refills | Status: DC
  Filled 2022-12-02: qty 60, 30d supply, fill #0
  Filled 2022-12-29: qty 60, 30d supply, fill #1
  Filled 2023-01-23: qty 60, 30d supply, fill #2

## 2022-12-02 MED FILL — Hydroxyzine HCl Tab 10 MG: ORAL | 10 days supply | Qty: 30 | Fill #0 | Status: AC

## 2022-12-02 NOTE — Telephone Encounter (Signed)
Refill request for Eliquis received. Indication: Afib  Last office visit: 109/23 (Nahser)  Scr: 0.81 (09/08/22)  Age: 62 Weight: 84.1kg  Appropriate dose and refill sent to requested pharmacy.

## 2022-12-02 NOTE — Telephone Encounter (Signed)
*  STAT* If patient is at the pharmacy, call can be transferred to refill team.   1. Which medications need to be refilled? (please list name of each medication and dose if known)   apixaban (ELIQUIS) 5 MG TABS tablet (Expired)   2. Which pharmacy/location (including street and city if local pharmacy) is medication to be sent to?  [facility]   3. Do they need a 30 day or 90 day supply?   30 day   Patient stated she still has some samples of this medication.

## 2022-12-12 ENCOUNTER — Ambulatory Visit (INDEPENDENT_AMBULATORY_CARE_PROVIDER_SITE_OTHER): Payer: Commercial Managed Care - PPO | Admitting: Family Medicine

## 2022-12-12 ENCOUNTER — Encounter: Payer: Self-pay | Admitting: Family Medicine

## 2022-12-12 ENCOUNTER — Other Ambulatory Visit: Payer: Self-pay

## 2022-12-12 VITALS — BP 107/80 | HR 61 | Temp 98.1°F | Ht 64.0 in | Wt 191.1 lb

## 2022-12-12 DIAGNOSIS — E782 Mixed hyperlipidemia: Secondary | ICD-10-CM | POA: Diagnosis not present

## 2022-12-12 DIAGNOSIS — Z1211 Encounter for screening for malignant neoplasm of colon: Secondary | ICD-10-CM | POA: Diagnosis not present

## 2022-12-12 DIAGNOSIS — E538 Deficiency of other specified B group vitamins: Secondary | ICD-10-CM | POA: Diagnosis not present

## 2022-12-12 DIAGNOSIS — R195 Other fecal abnormalities: Secondary | ICD-10-CM

## 2022-12-12 DIAGNOSIS — E559 Vitamin D deficiency, unspecified: Secondary | ICD-10-CM

## 2022-12-12 DIAGNOSIS — Z1231 Encounter for screening mammogram for malignant neoplasm of breast: Secondary | ICD-10-CM

## 2022-12-12 DIAGNOSIS — Z0000 Encounter for general adult medical examination without abnormal findings: Secondary | ICD-10-CM

## 2022-12-12 DIAGNOSIS — E669 Obesity, unspecified: Secondary | ICD-10-CM | POA: Diagnosis not present

## 2022-12-12 DIAGNOSIS — Z6832 Body mass index (BMI) 32.0-32.9, adult: Secondary | ICD-10-CM | POA: Diagnosis not present

## 2022-12-12 MED ORDER — SEMAGLUTIDE-WEIGHT MANAGEMENT 0.25 MG/0.5ML ~~LOC~~ SOAJ
0.2500 mg | SUBCUTANEOUS | 0 refills | Status: DC
  Filled 2022-12-12 (×2): qty 2, 28d supply, fill #0

## 2022-12-12 MED ORDER — SEMAGLUTIDE-WEIGHT MANAGEMENT 0.5 MG/0.5ML ~~LOC~~ SOAJ
0.5000 mg | SUBCUTANEOUS | 0 refills | Status: DC
  Filled 2022-12-12 – 2023-01-12 (×2): qty 2, 28d supply, fill #0

## 2022-12-12 MED ORDER — SEMAGLUTIDE-WEIGHT MANAGEMENT 1 MG/0.5ML ~~LOC~~ SOAJ
1.0000 mg | SUBCUTANEOUS | 0 refills | Status: DC
  Filled 2022-12-12 – 2023-01-23 (×2): qty 2, 28d supply, fill #0

## 2022-12-12 NOTE — Assessment & Plan Note (Signed)
Discussed importance of healthy weight management Discussed diet and exercise  Has BMI >30 and assoc HLD and h/o CVA Will try Wegovy - ramp up written Discussed possible side effects

## 2022-12-12 NOTE — Assessment & Plan Note (Signed)
Continue supplement Recheck level 

## 2022-12-12 NOTE — Progress Notes (Signed)
I,Sulibeya S Dimas,acting as a Education administrator for Lavon Paganini, MD.,have documented all relevant documentation on the behalf of Lavon Paganini, MD,as directed by  Lavon Paganini, MD while in the presence of Lavon Paganini, MD.    Complete physical exam   Patient: Kathleen Perkins   DOB: 05-18-1961   62 y.o. Female  MRN: [id number] Visit Date: 12/12/2022  Today's healthcare provider: Lavon Paganini, MD   Chief Complaint  Patient presents with   Annual Exam   Subjective    Kathleen Perkins is a 62 y.o. female who presents today for a complete physical exam.  She reports consuming a general diet. The patient does not participate in regular exercise at present. At this moment will start.  She generally feels well. She reports sleeping well. She does have additional problems to discuss today.  HPI  10/28/17 Pap/HPV-negative Patient declined shingrix vaccine today. Patient declined pap smear today. She reports she is not sexually active. Will get pap next year.    Really disappointed with her weight. Going to exercise again.    Past Medical History:  Diagnosis Date   Anxiety    Cerebrovascular accident (CVA) ([place]) 08/26/2022   Melanoma of upper arm ([place])    MVP (mitral valve prolapse)    Primary osteoarthritis of left knee 08/15/2014   Past Surgical History:  Procedure Laterality Date   ARTHROSCOPIC REPAIR ACL     BREAST BIOPSY Left 06/23/2013   u/s bx- neg   BREAST EXCISIONAL BIOPSY Left 07/14/2013   us/bx-neg   MELANOMA EXCISION     left arm.    REPLACEMENT TOTAL KNEE     left    Social History   Socioeconomic History   Marital status: Divorced    Spouse name: Not on file   Number of children: Not on file   Years of education: Not on file   Highest education level: Not on file  Occupational History   Not on file  Tobacco Use   Smoking status: Never   Smokeless tobacco: Never  Substance and Sexual Activity   Alcohol use: No   Drug use: No   Sexual  activity: Not on file  Other Topics Concern   Not on file  Social History Narrative   Not on file   Social Determinants of Health   Financial Resource Strain: Not on file  Food Insecurity: Not on file  Transportation Needs: Not on file  Physical Activity: Not on file  Stress: Not on file  Social Connections: Not on file  Intimate Partner Violence: Not on file   Family Status  Relation Name Status   Mother  Deceased       lung cancer    Father  Deceased   Sister  Deceased       cancer    Brother  Alive   Neg Hx  (Not Specified)   Family History  Problem Relation Age of Onset   Heart disease Father        CABG x 4    Hypertension Father    Hyperlipidemia Father    Heart disease Brother        CABG x 4    Hyperlipidemia Brother    Hypertension Brother    Heart attack Brother    Breast cancer Neg Hx    Allergies  Allergen Reactions   Ivp Dye [Iodinated Contrast Media] Shortness Of Breath    Patient had wheezing and hypoxia after returning from IV dye CT  Codeine Nausea Only   Erythromycin Nausea Only   Nsaids Other (See Comments)    Gi Bleed   Ibuprofen Nausea Only    Gi bleed    Patient Care Team: Virginia Crews, MD as PCP - General (Family Medicine) Nahser, Wonda Cheng, MD as PCP - Cardiology (Cardiology)   Medications: Outpatient Medications Prior to Visit  Medication Sig   apixaban (ELIQUIS) 5 MG TABS tablet Take 1 tablet (5 mg total) by mouth 2 (two) times daily.   atorvastatin (LIPITOR) 40 MG tablet Take 1 tablet (40 mg total) by mouth daily.   clonazePAM (KLONOPIN) 0.5 MG tablet TAKE ONE TABLET BY MOUTH TWICE A DAY AS NEEDED FOR ANXIETY   digoxin (LANOXIN) 0.125 MG tablet Take 1 tablet (0.125 mg total) by mouth daily.   diltiazem (CARDIZEM CD) 120 MG 24 hr capsule Take 1 capsule (120 mg total) by mouth daily.   diltiazem (CARDIZEM CD) 120 MG 24 hr capsule Take 1 capsule (120 mg total) by mouth daily.   flecainide (TAMBOCOR) 50 MG tablet Take 1  tablet (50 mg total) by mouth 2 (two) times daily.   flecainide (TAMBOCOR) 50 MG tablet Take 1 tablet (50 mg total) by mouth 2 (two) times daily.   folic acid (FOLVITE) 1 MG tablet Take 1 tablet (1 mg total) by mouth daily.   hydrOXYzine (ATARAX) 10 MG tablet TAKE ONE TABLET BY MOUTH THREE TIMES A DAY AS NEEDED   lamoTRIgine (LAMICTAL) 100 MG tablet Take 1 tablet (100 mg total) by mouth 2 (two) times daily.   metoprolol tartrate (LOPRESSOR) 25 MG tablet Take 1 tablet (25 mg total) by mouth 2 (two) times daily.   propranolol (INDERAL) 10 MG tablet Take 1 tablet by mouth up to four times daily as needed for breakthrough episodes of Atrial Fibrillation   thiamine (VITAMIN B-1) 100 MG tablet Take 1 tablet (100 mg total) by mouth daily.   traZODone (DESYREL) 100 MG tablet Take 1 tablet (100 mg total) by mouth at bedtime as needed for sleep. Please schedule office visit before any future refill.   [DISCONTINUED] flecainide (TAMBOCOR) 100 MG tablet Take 1 tablet (100 mg total) by mouth 2 (two) times daily. (Patient not taking: Reported on 12/12/2022)   No facility-administered medications prior to visit.    Review of Systems  All other systems reviewed and are negative.   Last CBC Lab Results  Component Value Date   WBC 8.2 09/08/2022   HGB 14.1 09/08/2022   HCT 40.1 09/08/2022   MCV 91 09/08/2022   MCH 32.0 09/08/2022   RDW 12.5 09/08/2022   PLT 337 72/08/4708   Last metabolic panel Lab Results  Component Value Date   GLUCOSE 102 (H) 09/08/2022   NA 142 09/08/2022   K 4.4 09/08/2022   CL 100 09/08/2022   CO2 24 09/08/2022   BUN 8 09/08/2022   CREATININE 0.81 09/08/2022   EGFR 83 09/08/2022   CALCIUM 9.9 09/08/2022   PHOS 4.5 08/28/2022   PROT 7.8 08/26/2022   ALBUMIN 4.5 08/26/2022   LABGLOB 2.7 11/08/2020   AGRATIO 1.9 11/08/2020   BILITOT 0.7 08/26/2022   ALKPHOS 77 08/26/2022   AST 23 08/26/2022   ALT 19 08/26/2022   ANIONGAP 8 08/29/2022   Last lipids Lab Results   Component Value Date   CHOL 223 (H) 08/26/2022   HDL 87 08/26/2022   LDLCALC 113 (H) 08/26/2022   TRIG 117 08/26/2022   CHOLHDL 2.6 08/26/2022   Last hemoglobin  A1c Lab Results  Component Value Date   HGBA1C 5.6 08/26/2022   Last thyroid functions Lab Results  Component Value Date   TSH 0.443 08/26/2022      Objective    BP 107/80 (BP Location: Right Arm, Patient Position: Sitting, Cuff Size: Large)   Pulse 61   Temp 98.1 F (36.7 C) (Temporal)   Ht '5\' 4"'$  (1.626 m)   Wt 191 lb 1.6 oz (86.7 kg)   SpO2 98%   BMI 32.80 kg/m  BP Readings from Last 3 Encounters:  12/12/22 107/80  09/16/22 116/66  09/08/22 135/82   Wt Readings from Last 3 Encounters:  12/12/22 191 lb 1.6 oz (86.7 kg)  09/16/22 185 lb 8 oz (84.1 kg)  09/08/22 183 lb (83 kg)     Physical Exam Vitals reviewed.  Constitutional:      General: She is not in acute distress.    Appearance: Normal appearance. She is well-developed. She is not diaphoretic.  HENT:     Head: Normocephalic and atraumatic.     Right Ear: Tympanic membrane, ear canal and external ear normal.     Left Ear: Tympanic membrane, ear canal and external ear normal.     Nose: Nose normal.     Mouth/Throat:     Mouth: Mucous membranes are moist.     Pharynx: Oropharynx is clear. No oropharyngeal exudate.  Eyes:     General: No scleral icterus.    Conjunctiva/sclera: Conjunctivae normal.     Pupils: Pupils are equal, round, and reactive to light.  Neck:     Thyroid: No thyromegaly.  Cardiovascular:     Rate and Rhythm: Normal rate and regular rhythm.     Heart sounds: Normal heart sounds. No murmur heard. Pulmonary:     Effort: Pulmonary effort is normal. No respiratory distress.     Breath sounds: Normal breath sounds. No wheezing or rales.  Abdominal:     General: There is no distension.     Palpations: Abdomen is soft.     Tenderness: There is no abdominal tenderness.  Musculoskeletal:        General: No deformity.      Cervical back: Neck supple.     Right lower leg: No edema.     Left lower leg: No edema.  Lymphadenopathy:     Cervical: No cervical adenopathy.  Skin:    General: Skin is warm and dry.     Findings: No rash.  Neurological:     Mental Status: She is alert and oriented to person, place, and time. Mental status is at baseline.     Gait: Gait normal.  Psychiatric:        Mood and Affect: Mood normal.        Behavior: Behavior normal.        Thought Content: Thought content normal.       Last depression screening scores    09/16/2022   10:45 AM 11/07/2021    1:29 PM 10/07/2021    1:03 PM  PHQ 2/9 Scores  PHQ - 2 Score 1 0 5  PHQ- 9 Score 1 0 14   Last fall risk screening    12/12/2022   11:12 AM  [place] in the past year? 0  Number falls in past yr: 0  Injury with Fall? 0  Risk for fall due to : No Fall Risks  Follow up Falls evaluation completed   Last Audit-C alcohol use screening  09/16/2022   10:45 AM  Alcohol Use Disorder Test (AUDIT)  1. How often do you have a drink containing alcohol? 0  2. How many drinks containing alcohol do you have on a typical day when you are drinking? 0  3. How often do you have six or more drinks on one occasion? 0  AUDIT-C Score 0   A score of 3 or more in women, and 4 or more in men indicates increased risk for alcohol abuse, EXCEPT if all of the points are from question 1   No results found for any visits on 12/12/22.  Assessment & Plan    Routine Health Maintenance and Physical Exam  Exercise Activities and Dietary recommendations  Goals   None     Immunization History  Administered Date(s) Administered   DTaP 05/06/1966   Diphtheria 04/26/1969, 04/05/1970   Influenza,inj,Quad PF,6+ Mos 12/06/2020   Influenza-Unspecified 10/23/2021, 08/28/2022   MMR 05/13/2013, 07/01/2013   Measles 03/12/1971   Mumps 07/22/1972   OPV 01/30/1963, 03/27/1963, 04/26/1969, 04/30/1971, 06/06/1975   PFIZER(Purple  Top)SARS-COV-2 Vaccination 11/01/2020, 11/22/2020   PPD Test 05/08/2020, 02/25/2022, 03/17/2022   Rubella 03/12/1971   Tdap 05/05/2013, 02/17/2020   Tetanus 04/26/1969, 04/05/1970    Health Maintenance  Topic Date Due   Hepatitis C Screening  Never done   Zoster Vaccines- Shingrix (1 of 2) Never done   COLONOSCOPY (Pts 45-19yr Insurance coverage will need to be confirmed)  Never done   MAMMOGRAM  11/26/2019   PAP SMEAR-Modifier  10/28/2020   COVID-19 Vaccine (3 - Pfizer risk series) 12/20/2020   DTaP/Tdap/Td (6 - Td or Tdap) 02/16/2030   INFLUENZA VACCINE  Completed   HIV Screening  Completed   HPV VACCINES  Aged Out    Discussed health benefits of physical activity, and encouraged her to engage in regular exercise appropriate for her age and condition.  Problem List Items Addressed This Visit       Other   Hyperlipidemia    Reviewed recent lipid panel Also managed by Cardiology No changes to statin      B12 deficiency    Continue supplement Recheck level       Avitaminosis D    Continue supplement Recheck level       Class 1 obesity without serious comorbidity with body mass index (BMI) of 32.0 to 32.9 in adult    Discussed importance of healthy weight management Discussed diet and exercise  Has BMI >30 and assoc HLD and h/o CVA Will try Wegovy - ramp up written Discussed possible side effects      Relevant Medications   Semaglutide-Weight Management 0.25 MG/0.5ML SOAJ   Semaglutide-Weight Management 0.5 MG/0.5ML SOAJ (Start on 01/10/2023)   Semaglutide-Weight Management 1 MG/0.5ML SOAJ (Start on 02/08/2023)   Other Visit Diagnoses     Encounter for annual physical exam    -  Primary   Colon cancer screening       Relevant Orders   Cologuard   Encounter for screening mammogram for malignant neoplasm of breast       Relevant Orders   MM 3D SCREEN BREAST BILATERAL        Return in about 3 months (around 03/13/2023) for weight f/u.     I, ALavon Paganini MD, have reviewed all documentation for this visit. The documentation on 12/12/22 for the exam, diagnosis, procedures, and orders are all accurate and complete.   Janene Yousuf, ADionne Bucy MD, MPH [facility]

## 2022-12-12 NOTE — Assessment & Plan Note (Signed)
Reviewed recent lipid panel Also managed by Cardiology No changes to statin

## 2022-12-16 ENCOUNTER — Other Ambulatory Visit: Payer: Self-pay

## 2022-12-18 ENCOUNTER — Other Ambulatory Visit: Payer: Self-pay | Admitting: Family Medicine

## 2022-12-18 ENCOUNTER — Encounter: Payer: Self-pay | Admitting: Cardiovascular Disease

## 2022-12-19 ENCOUNTER — Other Ambulatory Visit: Payer: Self-pay

## 2022-12-29 ENCOUNTER — Other Ambulatory Visit: Payer: Self-pay

## 2022-12-29 ENCOUNTER — Other Ambulatory Visit: Payer: Self-pay | Admitting: Family Medicine

## 2022-12-30 ENCOUNTER — Other Ambulatory Visit: Payer: Self-pay

## 2022-12-30 MED FILL — Hydroxyzine HCl Tab 10 MG: ORAL | 10 days supply | Qty: 30 | Fill #0 | Status: AC

## 2022-12-31 ENCOUNTER — Other Ambulatory Visit: Payer: Self-pay

## 2023-01-01 ENCOUNTER — Other Ambulatory Visit: Payer: Self-pay

## 2023-01-06 ENCOUNTER — Encounter: Payer: Self-pay | Admitting: Cardiovascular Disease

## 2023-01-06 ENCOUNTER — Other Ambulatory Visit: Payer: Self-pay

## 2023-01-06 ENCOUNTER — Other Ambulatory Visit: Payer: Self-pay | Admitting: Family Medicine

## 2023-01-06 MED ORDER — ATORVASTATIN CALCIUM 40 MG PO TABS
40.0000 mg | ORAL_TABLET | Freq: Every day | ORAL | 2 refills | Status: DC
  Filled 2023-01-06: qty 30, 30d supply, fill #0
  Filled 2023-02-12: qty 30, 30d supply, fill #1
  Filled 2023-03-25: qty 30, 30d supply, fill #2

## 2023-01-06 NOTE — Progress Notes (Unsigned)
Cardiology Office Note:    Date:  01/07/2023   ID:  Kathleen Perkins, DOB July 29, 1961, MRN [id number]  PCP:  Virginia Crews, MD  [facility] Cardiologist:  [facility] Electrophysiologist:  None   Referring MD: Virginia Crews, MD   Chief Complaint  Patient presents with   Atrial Fibrillation   Code Stroke     Jan. 7, 2022   Kathleen Perkins is a 62 y.o. female with a hx of palpitations.  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  Has had palpitations - was started on Verapamil Then atenolol  Worse with menapause  palps seem to be worse with stress Episodes of tachycardia  - would last perhaps 30 minutes   Exercises intermittantly.  Did well around 2017 for years , not so much this past year Really enjoyed exercising.  Admits that her palpitations   Tries to avoid caffeine,   Has bipolar - tends to be more manic than depressed.   Oct. 9, 2023 [facility] seen today for follow up of her mitral regurgitation, hyperlipidemia, and recent diagnosis of atrial fib and a recent stroke   Lipids from 08/23/22 : Chol = 223 HDL = 87 LDL = 113 Trigs = 117  These most recent lipids have improved on Atorvastatin since her previous labs in Dec. 2021  She was admitted to [facility] ( 9/25, 2023) several weeks ago  with an acute stroke and atrial fib.  Received TNK  Had a severe contrast allergy with the head CT .  Improved significantly   Echo shows normal LV function   Has noticed more palpitations   Had an ER visit on aug. 29, 2023  for chest pain  Her father and Georgann Housekeeper (brother ) had CABG  Brother Abe People is doing well   Occasional episodes of apprehension    Has had paroxysmal atrial fib - has been persistent for the past 2 weeks.    The plan is to continue anticoagulation for the next week Schedule TEE / cardioversion  .  I specifically would like to do a TEE prior to the CV since she is just 2 weeks out from a stroke .  If she converts to sinus rhythm before or after her scheduled cardioversion,  she is to start taking Flecainide 50 mg BID.     Feb. 7, 2024 Kathleen Perkins is seen for follow up of her PAF and stroke  ( prior to Afib diagnosis)   She is on Flecainide Had no QRS widening on her GXT   She's upset over her weight.    Had an episode of PAF last night Lasted ~ 1 - 1.5 hours .  She had not taken her meds ( no flecainide , no metoprolol ) HR 130-150   She takes an extra propranolol when she has PAF Occasionally will take an extra flecainide 50 mg for breakthrough PAF  She also takes an extra metoprolol 25 mg for breakthrough PAF    Wants to lose 7 lbs before next   Past Medical History:  Diagnosis Date   Anxiety    Cerebrovascular accident (CVA) ([place]) 08/26/2022   Melanoma of upper arm ([place])    MVP (mitral valve prolapse)    Primary osteoarthritis of left knee 08/15/2014    Past Surgical History:  Procedure Laterality Date   ARTHROSCOPIC REPAIR ACL     BREAST BIOPSY Left 06/23/2013   u/s bx- neg   BREAST EXCISIONAL BIOPSY Left 07/14/2013   us/bx-neg   MELANOMA EXCISION     left arm.    REPLACEMENT TOTAL KNEE     left     Current Medications: Current Meds  Medication Sig   apixaban (ELIQUIS) 5 MG TABS tablet Take 1 tablet (5 mg total) by mouth 2 (two) times daily.   atorvastatin (LIPITOR) 40 MG tablet Take 1 tablet (40 mg total) by mouth daily.   clonazePAM (KLONOPIN) 0.5 MG tablet TAKE ONE TABLET BY MOUTH TWICE A DAY AS NEEDED FOR ANXIETY   digoxin (LANOXIN) 0.125 MG tablet Take 1 tablet (0.125 mg total) by mouth daily.   diltiazem (CARDIZEM CD) 120 MG 24 hr capsule Take 1 capsule (120 mg total) by mouth daily.   flecainide (TAMBOCOR) 50 MG tablet Take 1 tablet (50 mg total) by mouth 2 (two) times daily. May take additional tablet daily as need for breakthrough atrial fib   folic  acid (FOLVITE) 1 MG tablet Take 1 tablet (1 mg total) by mouth daily.   hydrOXYzine (ATARAX) 10 MG tablet Take 1 tablet (10 mg total) by mouth 3 (three) times daily as needed.   lamoTRIgine (LAMICTAL) 100 MG tablet Take 1 tablet (100 mg total) by mouth 2 (two) times daily.   metoprolol tartrate (LOPRESSOR) 25 MG tablet Take 1 tablet (25 mg total) by mouth 2 (two) times daily. May take an additional tablet daily for breakthrough atrial fib   propranolol (INDERAL) 10 MG tablet Take 1 tablet by mouth up to four times daily as needed for breakthrough episodes of Atrial Fibrillation   traZODone (DESYREL) 100 MG tablet TAKE 1 TABLET BY MOUTH AT BEDTIME AS NEEDED FOR SLEEP   [DISCONTINUED] flecainide (TAMBOCOR) 50 MG tablet Take 1 tablet (50 mg total) by mouth 2 (two) times daily.   [DISCONTINUED] metoprolol tartrate (LOPRESSOR) 25 MG tablet Take 1 tablet (25 mg total) by mouth 2 (two) times daily.     Allergies:   Ivp dye [iodinated contrast media], Codeine, Erythromycin, Nsaids, and Ibuprofen   Social History   Socioeconomic History   Marital status: Divorced    Spouse name: Not on file   Number of children: Not on file   Years of education: Not on file   Highest education level: Not on file  Occupational History   Not on file  Tobacco Use   Smoking status: Never   Smokeless tobacco: Never  Substance and Sexual Activity   Alcohol use: No   Drug use: No   Sexual activity: Not on file  Other Topics Concern   Not on file  Social History Narrative   Not on file   Social Determinants of Health   Financial Resource Strain: Not on file  Food Insecurity: Not on file  Transportation Needs: Not on file  Physical Activity: Not on file  Stress: Not on file  Social Connections: Not on file     Family History: The patient's family history includes Heart attack in her brother; Heart disease in her brother and father; Hyperlipidemia in her brother and father; Hypertension in her brother and  father. There is no history of Breast cancer.  ROS:   Please see the history of present illness.     All other systems reviewed and are negative.  EKGs/Labs/Other Studies Reviewed:    The following studies were reviewed today:  EKG:     Recent Labs: 08/26/2022: ALT 19; B Natriuretic Peptide 327.3; TSH 0.443 08/29/2022: Magnesium 2.5 09/08/2022: BUN 8; Creatinine, Ser 0.81; Hemoglobin 14.1; Platelets 337; Potassium 4.4; Sodium 142  Recent Lipid Panel    Component Value Date/Time   CHOL 223 (H) 08/26/2022 0409   CHOL 268 (H) 11/08/2020 1423   TRIG 117 08/26/2022 0409   HDL 87 08/26/2022 0409   HDL 93 11/08/2020 1423   CHOLHDL 2.6 08/26/2022 0409   VLDL 23 08/26/2022 0409   LDLCALC 113 (H) 08/26/2022 0409   LDLCALC 145 (H) 11/08/2020 1423     Risk Assessment/Calculations:       Physical Exam:     Physical Exam: Blood pressure 134/80, pulse 60, height '5\' 4"'$  (1.626 m), weight 191 lb 6.4 oz (86.8 kg), SpO2 98 %.       GEN:  Well nourished, well developed in no acute distress HEENT: Normal NECK: No JVD; No carotid bruits LYMPHATICS: No lymphadenopathy CARDIAC: RRR , soft systolic murmur  RESPIRATORY:  Clear to auscultation without rales, wheezing or rhonchi  ABDOMEN: Soft, non-tender, non-distended MUSCULOSKELETAL:  No edema; No deformity  SKIN: Warm and dry NEUROLOGIC:  Alert and oriented x 3    ASSESSMENT:    No diagnosis found.   PLAN:      Paroxysmal atrial fib: A-fib is overall very well-controlled.  She does have occasional breakthroughs of PAF about 3 times a week.  She takes an extra flecainide 50 mg tablets and an extra metoprolol 25 mg when these happen.  Will refill her flecainide and metoprolol to allow her to take some extra medications on an as-needed basis. We once again discussed A-fib ablation.  She still not ready to commit to this.  She is thinking may be the end of this year or perhaps the start of 2025.  I will see her again in 3 months  for follow-up visit.   2.  Hyperlipidemia:  Her LDL from August 26, 2022 is 113. She has a family history of coronary artery disease-her father and her older brother both had coronary artery bypass grafting. We will do an an ischemic assessment at some point following her cardioversion.  We may consider coronary calcium score.   She is not having any angina .    Encouraged her to exercise  She wants to lose 7 lbs before her next visit    We may also consider a coronary CT angiogram with premedications for IV contrast        Medication Adjustments/Labs and Tests Ordered: Current medicines are reviewed at length with the patient today.  Concerns regarding medicines are outlined above.  No orders of the defined types were placed in this encounter.  Meds ordered this encounter  Medications   flecainide (TAMBOCOR) 50 MG tablet    Sig: Take 1 tablet (50 mg total) by mouth 2 (two) times daily. May take additional tablet daily as need for breakthrough atrial fib    Dispense:  210 tablet    Refill:  3   metoprolol tartrate (LOPRESSOR) 25 MG tablet    Sig: Take 1 tablet (25 mg total) by mouth 2 (two) times daily. May take an additional tablet daily for breakthrough atrial fib    Dispense:  210 tablet    Refill:  3    Patient Instructions  Medication Instructions:  Your physician recommends that you continue on your current medications as directed. Please refer to the Current Medication list given to you today.  *  If you need a refill on your cardiac medications before your next appointment, please call your pharmacy*   Follow-Up: At [facility], you and your health needs are our priority.  As part of our continuing mission to provide you with exceptional heart care, we have created designated Provider Care Teams.  These Care Teams include your primary Cardiologist (physician) and Advanced Practice Providers (APPs -  Physician Assistants and Nurse Practitioners) who all  work together to provide you with the care you need, when you need it.   Your next appointment:   3 month(s)  Provider:   Mertie Moores, MD      Signed, Mertie Moores, MD  01/07/2023 6:03 PM    [place]

## 2023-01-07 ENCOUNTER — Ambulatory Visit: Payer: Commercial Managed Care - PPO | Attending: Cardiovascular Disease | Admitting: Cardiovascular Disease

## 2023-01-07 ENCOUNTER — Other Ambulatory Visit: Payer: Self-pay

## 2023-01-07 ENCOUNTER — Ambulatory Visit: Payer: Commercial Managed Care - PPO | Admitting: Cardiovascular Disease

## 2023-01-07 ENCOUNTER — Encounter: Payer: Self-pay | Admitting: Cardiovascular Disease

## 2023-01-07 VITALS — BP 134/80 | HR 60 | Ht 64.0 in | Wt 191.4 lb

## 2023-01-07 DIAGNOSIS — I4891 Unspecified atrial fibrillation: Secondary | ICD-10-CM

## 2023-01-07 DIAGNOSIS — I341 Nonrheumatic mitral (valve) prolapse: Secondary | ICD-10-CM

## 2023-01-07 MED ORDER — FLECAINIDE ACETATE 50 MG PO TABS
50.0000 mg | ORAL_TABLET | Freq: Two times a day (BID) | ORAL | 3 refills | Status: DC
  Filled 2023-01-07 – 2023-01-23 (×2): qty 210, 105d supply, fill #0
  Filled 2023-02-26: qty 210, 90d supply, fill #0

## 2023-01-07 MED ORDER — METOPROLOL TARTRATE 25 MG PO TABS
25.0000 mg | ORAL_TABLET | Freq: Two times a day (BID) | ORAL | 3 refills | Status: DC
  Filled 2023-01-07 – 2023-01-23 (×2): qty 210, 105d supply, fill #0
  Filled 2023-03-03: qty 210, 90d supply, fill #0
  Filled 2023-03-25: qty 270, 90d supply, fill #0
  Filled 2023-06-09: qty 270, 90d supply, fill #1
  Filled 2023-09-21 – 2023-10-15 (×4): qty 270, 90d supply, fill #2

## 2023-01-07 NOTE — Patient Instructions (Signed)
Medication Instructions:  Your physician recommends that you continue on your current medications as directed. Please refer to the Current Medication list given to you today.  *If you need a refill on your cardiac medications before your next appointment, please call your pharmacy*   Follow-Up: At [facility], you and your health needs are our priority.  As part of our continuing mission to provide you with exceptional heart care, we have created designated Provider Care Teams.  These Care Teams include your primary Cardiologist (physician) and Advanced Practice Providers (APPs -  Physician Assistants and Nurse Practitioners) who all work together to provide you with the care you need, when you need it.   Your next appointment:   3 month(s)  Provider:   Mertie Moores, MD

## 2023-01-09 ENCOUNTER — Other Ambulatory Visit: Payer: Self-pay

## 2023-01-12 ENCOUNTER — Other Ambulatory Visit: Payer: Self-pay

## 2023-01-13 ENCOUNTER — Other Ambulatory Visit: Payer: Self-pay

## 2023-01-16 ENCOUNTER — Other Ambulatory Visit: Payer: Self-pay

## 2023-01-23 ENCOUNTER — Other Ambulatory Visit: Payer: Self-pay

## 2023-01-27 ENCOUNTER — Other Ambulatory Visit: Payer: Self-pay

## 2023-01-27 ENCOUNTER — Telehealth: Payer: Self-pay | Admitting: Family Medicine

## 2023-01-27 NOTE — Telephone Encounter (Signed)
Copied from [place] [id number]. Topic: General - Other >> Jan 27, 2023 12:56 PM [place] B wrote: Reason for CRM: Patient states stopped taking wegovy as requested by pharmacy wouldn't have any in stock for her next refill, so she wants to know can she get refill samples from Dr B until her appt in May. Please call back

## 2023-01-27 NOTE — Telephone Encounter (Signed)
Please let patient know that we, unfortunately, do not have any Wegovy samples.

## 2023-01-28 DIAGNOSIS — Z1211 Encounter for screening for malignant neoplasm of colon: Secondary | ICD-10-CM | POA: Diagnosis not present

## 2023-01-28 NOTE — Telephone Encounter (Signed)
Pt returned call.  States she was not asking for samples. Reports she did not start the [facility] until 01/14/23  as it was backordered. She has appt with Dr. Jacinto Reap. In May and will have run out by then. She is requesting a refill "Extension" so she does not run out. States "Or I can see Dr. B earlier."  Please advise.

## 2023-01-29 ENCOUNTER — Other Ambulatory Visit: Payer: Self-pay

## 2023-01-29 MED ORDER — SEMAGLUTIDE-WEIGHT MANAGEMENT 1 MG/0.5ML ~~LOC~~ SOAJ
1.0000 mg | SUBCUTANEOUS | 0 refills | Status: DC
  Filled 2023-01-29: qty 6, 84d supply, fill #0

## 2023-01-29 MED ORDER — SEMAGLUTIDE-WEIGHT MANAGEMENT 0.5 MG/0.5ML ~~LOC~~ SOAJ
0.5000 mg | SUBCUTANEOUS | 0 refills | Status: DC
  Filled 2023-01-29: qty 6, 84d supply, fill #0

## 2023-01-29 NOTE — Addendum Note (Signed)
Addended by: Virginia Crews on: 01/29/2023 01:18 PM   Modules accepted: Orders

## 2023-01-29 NOTE — Telephone Encounter (Signed)
Ok I see. That is more clear than last message.  The next 2 higher doses were already sent to th pharmacy, so she'll be able to get those refills to hold her until May. That being said, [place] is no longer covering [facility] or other medications in the class for weight loss starting 4/1 for existing users. So, she is likely to lose coverage before seeing me in May anyways.

## 2023-01-29 NOTE — Telephone Encounter (Signed)
Pt. Given message and verbalizes understanding. She is asking for additional refills , 3 month supply on 0.5 mg and 1 mg. Would like to do this so she has a supply before she has to pay out of pocket for the medication."This way I'll have several month supply on hand." Please advise pt.

## 2023-02-02 ENCOUNTER — Encounter: Payer: Self-pay | Admitting: Family Medicine

## 2023-02-02 ENCOUNTER — Other Ambulatory Visit: Payer: Self-pay

## 2023-02-02 ENCOUNTER — Ambulatory Visit: Payer: Commercial Managed Care - PPO | Admitting: Family Medicine

## 2023-02-02 VITALS — BP 126/86 | HR 64 | Temp 98.3°F | Resp 12 | Wt 187.9 lb

## 2023-02-02 DIAGNOSIS — Z6832 Body mass index (BMI) 32.0-32.9, adult: Secondary | ICD-10-CM | POA: Diagnosis not present

## 2023-02-02 DIAGNOSIS — E669 Obesity, unspecified: Secondary | ICD-10-CM

## 2023-02-02 MED ORDER — SEMAGLUTIDE-WEIGHT MANAGEMENT 1.7 MG/0.75ML ~~LOC~~ SOAJ
1.7000 mg | SUBCUTANEOUS | 0 refills | Status: AC
  Filled 2023-02-02 – 2023-03-12 (×7): qty 3, 28d supply, fill #0
  Filled ????-??-??: fill #0

## 2023-02-02 MED ORDER — SEMAGLUTIDE-WEIGHT MANAGEMENT 1 MG/0.5ML ~~LOC~~ SOAJ
1.0000 mg | SUBCUTANEOUS | 0 refills | Status: DC
  Filled 2023-02-02: qty 6, 84d supply, fill #0
  Filled 2023-02-16 (×2): qty 2, 28d supply, fill #0
  Filled 2023-04-03: qty 6, 84d supply, fill #0
  Filled 2023-04-03: qty 2, 28d supply, fill #0
  Filled ????-??-??: fill #0

## 2023-02-02 NOTE — Assessment & Plan Note (Signed)
Discussed importance of healthy weight management Discussed diet and exercise Having weight loss and good improvement in mentation around food Refills sent to pharmacy Plans to go up to 1.7 mg weekly and then down to '1mg'$  for maintenance

## 2023-02-02 NOTE — Progress Notes (Signed)
I,Sulibeya S Dimas,acting as a scribe for Lavon Paganini, MD.,have documented all relevant documentation on the behalf of Lavon Paganini, MD,as directed by  Lavon Paganini, MD while in the presence of Lavon Paganini, MD.     Established patient visit   Patient: Kathleen Perkins   DOB: 03-13-1961   62 y.o. Female  MRN: [id number] Visit Date: 02/02/2023  Today's healthcare provider: Lavon Paganini, MD   Chief Complaint  Patient presents with   Obesity   Subjective    HPI  Follow up for obesity  The patient was last seen for this 7 weeks ago. Changes made at last visit include start [facility].  Patient requesting 3 month supply on 0.5 mg and 1 mg. Before she has to pay out of pocket.   Wt Readings from Last 3 Encounters:  02/02/23 187 lb 14.4 oz (85.2 kg)  01/07/23 191 lb 6.4 oz (86.8 kg)  12/12/22 191 lb 1.6 oz (86.7 kg)   -----------------------------------------------------------------------------------------   Medications: Outpatient Medications Prior to Visit  Medication Sig   apixaban (ELIQUIS) 5 MG TABS tablet Take 1 tablet (5 mg total) by mouth 2 (two) times daily.   atorvastatin (LIPITOR) 40 MG tablet Take 1 tablet (40 mg total) by mouth daily.   clonazePAM (KLONOPIN) 0.5 MG tablet TAKE ONE TABLET BY MOUTH TWICE A DAY AS NEEDED FOR ANXIETY   digoxin (LANOXIN) 0.125 MG tablet Take 1 tablet (0.125 mg total) by mouth daily.   diltiazem (CARDIZEM CD) 120 MG 24 hr capsule Take 1 capsule (120 mg total) by mouth daily.   flecainide (TAMBOCOR) 50 MG tablet Take 1 tablet (50 mg total) by mouth 2 (two) times daily. May take additional tablet daily as need for breakthrough atrial fib   folic acid (FOLVITE) 1 MG tablet Take 1 tablet (1 mg total) by mouth daily.   hydrOXYzine (ATARAX) 10 MG tablet Take 1 tablet (10 mg total) by mouth 3 (three) times daily as needed.   lamoTRIgine (LAMICTAL) 100 MG tablet Take 1 tablet (100 mg total) by mouth 2 (two) times daily.    metoprolol tartrate (LOPRESSOR) 25 MG tablet Take 1 tablet (25 mg total) by mouth 2 (two) times daily. May take an additional tablet daily for breakthrough atrial fib   propranolol (INDERAL) 10 MG tablet Take 1 tablet by mouth up to four times daily as needed for breakthrough episodes of Atrial Fibrillation   thiamine (VITAMIN B-1) 100 MG tablet Take 1 tablet (100 mg total) by mouth daily.   traZODone (DESYREL) 100 MG tablet TAKE 1 TABLET BY MOUTH AT BEDTIME AS NEEDED FOR SLEEP   [DISCONTINUED] Semaglutide-Weight Management 0.5 MG/0.5ML SOAJ Inject 0.5 mg into the skin once a week.   [DISCONTINUED] Semaglutide-Weight Management 1 MG/0.5ML SOAJ Inject 1 mg into the skin once a week.   [DISCONTINUED] Semaglutide-Weight Management 0.25 MG/0.5ML SOAJ Inject 0.25 mg into the skin once a week for 28 days. (Patient not taking: Reported on 02/02/2023)   No facility-administered medications prior to visit.    Review of Systems  Constitutional:  Negative for fatigue.  Cardiovascular:  Negative for chest pain.  Gastrointestinal:  Positive for nausea.       Objective    BP 126/86 (BP Location: Left Arm, Patient Position: Sitting, Cuff Size: Large)   Pulse 64   Temp 98.3 F (36.8 C) (Oral)   Resp 12   Wt 187 lb 14.4 oz (85.2 kg)   BMI 32.25 kg/m    Physical Exam Constitutional:  General: She is not in acute distress.    Appearance: Normal appearance.  HENT:     Head: Normocephalic.  Pulmonary:     Effort: Pulmonary effort is normal. No respiratory distress.  Neurological:     Mental Status: She is alert and oriented to person, place, and time. Mental status is at baseline.      No results found for any visits on 02/02/23.  Assessment & Plan     Problem List Items Addressed This Visit       Other   Class 1 obesity without serious comorbidity with body mass index (BMI) of 32.0 to 32.9 in adult - Primary    Discussed importance of healthy weight management Discussed diet and  exercise Having weight loss and good improvement in mentation around food Refills sent to pharmacy Plans to go up to 1.7 mg weekly and then down to '1mg'$  for maintenance      Relevant Medications   Semaglutide-Weight Management 1 MG/0.5ML SOAJ (Start on 02/08/2023)   Semaglutide-Weight Management 1.7 MG/0.75ML SOAJ (Start on 04/30/2023)     Return for as scheduled.      I, Lavon Paganini, MD, have reviewed all documentation for this visit. The documentation on 02/02/23 for the exam, diagnosis, procedures, and orders are all accurate and complete.   Ernest Orr, Dionne Bucy, MD, MPH [facility]

## 2023-02-06 ENCOUNTER — Other Ambulatory Visit: Payer: Self-pay

## 2023-02-12 ENCOUNTER — Other Ambulatory Visit: Payer: Self-pay

## 2023-02-14 LAB — COLOGUARD: COLOGUARD: POSITIVE — AB

## 2023-02-16 ENCOUNTER — Other Ambulatory Visit: Payer: Self-pay

## 2023-02-19 NOTE — Addendum Note (Signed)
Addended by: Adolph Pollack on: 02/19/2023 10:06 AM   Modules accepted: Orders

## 2023-02-20 ENCOUNTER — Other Ambulatory Visit: Payer: Self-pay

## 2023-02-23 ENCOUNTER — Encounter: Payer: Self-pay | Admitting: Physician Assistant

## 2023-02-23 ENCOUNTER — Telehealth (INDEPENDENT_AMBULATORY_CARE_PROVIDER_SITE_OTHER): Payer: Commercial Managed Care - PPO | Admitting: Physician Assistant

## 2023-02-23 DIAGNOSIS — U071 COVID-19: Secondary | ICD-10-CM

## 2023-02-23 DIAGNOSIS — J019 Acute sinusitis, unspecified: Secondary | ICD-10-CM

## 2023-02-23 DIAGNOSIS — J069 Acute upper respiratory infection, unspecified: Secondary | ICD-10-CM

## 2023-02-23 NOTE — Progress Notes (Unsigned)
Argentina Ponder De[place],acting as a Education administrator for [employer], PA-C.,have documented all relevant documentation on the behalf of Mardene Speak, PA-C,as directed by  [employer], PA-C while in the presence of [employer], PA-C.   MyChart Video Visit    Virtual Visit via Video Note   This format is felt to be most appropriate for this patient at this time. Physical exam was limited by quality of the video and audio technology used for the visit.   Patient location: home Provider location: office  I discussed the limitations of evaluation and management by telemedicine and the availability of in person appointments. The patient expressed understanding and agreed to proceed.  Patient: Kathleen Perkins   DOB: 1961-01-28   62 y.o. Female  MRN: [id number] Visit Date: 02/23/2023  Today's healthcare provider: Mardene Speak, PA-C   No chief complaint on file.  Subjective    HPI  Patient is a 62 year old female who presents via video visit.  She states that on 3/23  at 3 am she woke in atrial fib.  She also complained of Headache at that time.  She said she took her medication that had been given to her for her A Fib and since then had gotten better with it.  She did however continue with headache, began having head congestion that is now in her chest.  She has a barky cough that is non productive most of the time and painful.  She has been using Guaifenesin.  In the mornings her cough is productive of thick green mucus.  Her temperature has gotten as high as 100.4 and she does have shortness of breath especially when exerting herself.     Medications: Outpatient Medications Prior to Visit  Medication Sig   apixaban (ELIQUIS) 5 MG TABS tablet Take 1 tablet (5 mg total) by mouth 2 (two) times daily.   atorvastatin (LIPITOR) 40 MG tablet Take 1 tablet (40 mg total) by mouth daily.   clonazePAM (KLONOPIN) 0.5 MG tablet TAKE ONE TABLET BY MOUTH TWICE A DAY AS NEEDED FOR ANXIETY   digoxin (LANOXIN)  0.125 MG tablet Take 1 tablet (0.125 mg total) by mouth daily.   diltiazem (CARDIZEM CD) 120 MG 24 hr capsule Take 1 capsule (120 mg total) by mouth daily.   flecainide (TAMBOCOR) 50 MG tablet Take 1 tablet (50 mg total) by mouth 2 (two) times daily. May take additional tablet daily as need for breakthrough atrial fib   folic acid (FOLVITE) 1 MG tablet Take 1 tablet (1 mg total) by mouth daily.   hydrOXYzine (ATARAX) 10 MG tablet Take 1 tablet (10 mg total) by mouth 3 (three) times daily as needed.   lamoTRIgine (LAMICTAL) 100 MG tablet Take 1 tablet (100 mg total) by mouth 2 (two) times daily.   metoprolol tartrate (LOPRESSOR) 25 MG tablet Take 1 tablet (25 mg total) by mouth 2 (two) times daily. May take an additional tablet daily for breakthrough atrial fib   propranolol (INDERAL) 10 MG tablet Take 1 tablet by mouth up to four times daily as needed for breakthrough episodes of Atrial Fibrillation   Semaglutide-Weight Management 1 MG/0.5ML SOAJ Inject 1 mg into the skin once a week.   [START ON 04/30/2023] Semaglutide-Weight Management 1.7 MG/0.75ML SOAJ Inject 1.7 mg into the skin once a week for 28 days.   thiamine (VITAMIN B-1) 100 MG tablet Take 1 tablet (100 mg total) by mouth daily.   traZODone (DESYREL) 100 MG tablet TAKE 1 TABLET BY  MOUTH AT BEDTIME AS NEEDED FOR SLEEP   No facility-administered medications prior to visit.    Review of Systems  HENT:  Positive for congestion, postnasal drip, rhinorrhea, sneezing and sore throat. Negative for ear discharge, ear pain, hearing loss and tinnitus.   Respiratory:  Positive for cough and shortness of breath. Negative for wheezing.   Cardiovascular:  Positive for palpitations. Negative for chest pain and leg swelling.  Gastrointestinal:  Negative for abdominal pain, constipation, diarrhea, nausea and vomiting.  Musculoskeletal:  Positive for myalgias.  Neurological:  Positive for headaches. Negative for dizziness and light-headedness.     {Labs  Heme  Chem  Endocrine  Serology  Results Review (optional):23779}   Objective    There were no vitals taken for this visit.  {Show previous vital signs (optional):23777}   Physical Exam     Assessment & Plan     1. Acute rhinosinusitis X 4 days Most likely viral etiology Adivsed to proceed with covid test at home Continue symptomatic treatment . Increase fluids.  Rest.  Saline nasal spray.  Probiotic.  Mucinex as directed.  Humidifier in bedroom. Flonase OTC. Counseled risk/benefits of medications. Expected course of illness. Potential rebound symptoms and potential complications and to call if sx worsen or not much better when finished with symptomatic treatment..  Follow isolation guidelines   Work note was provided. I discussed the assessment and treatment plan with the patient. The patient was provided an opportunity to ask questions and all were answered. The patient agreed with the plan and demonstrated an understanding of the instructions.  No follow-ups on file.     The patient was advised to call back or seek an in-person evaluation if the symptoms worsen or if the condition fails to improve as anticipated.  I provided 24 minutes of non-face-to-face time during this encounter.  The patient was advised to call back or seek an in-person evaluation if the symptoms worsen or if the condition fails to improve as anticipated.  I discussed the assessment and treatment plan with the patient. The patient was provided an opportunity to ask questions and all were answered. The patient agreed with the plan and demonstrated an understanding of the instructions.  I, Mardene Speak, PA-C have reviewed all documentation for this visit. The documentation on 02/23/23  for the exam, diagnosis, procedures, and orders are all accurate and complete.  Mardene Speak, [facility], [place] [phone] (phone) [phone] (fax)  [place]

## 2023-02-24 ENCOUNTER — Other Ambulatory Visit: Payer: Self-pay

## 2023-02-24 ENCOUNTER — Telehealth: Payer: Self-pay

## 2023-02-24 DIAGNOSIS — U071 COVID-19: Secondary | ICD-10-CM | POA: Insufficient documentation

## 2023-02-24 DIAGNOSIS — J069 Acute upper respiratory infection, unspecified: Secondary | ICD-10-CM | POA: Insufficient documentation

## 2023-02-24 MED ORDER — BENZONATATE 100 MG PO CAPS
100.0000 mg | ORAL_CAPSULE | Freq: Two times a day (BID) | ORAL | 0 refills | Status: DC | PRN
  Filled 2023-02-24: qty 20, 10d supply, fill #0

## 2023-02-24 MED ORDER — NIRMATRELVIR/RITONAVIR (PAXLOVID)TABLET
3.0000 | ORAL_TABLET | Freq: Two times a day (BID) | ORAL | 0 refills | Status: AC
  Filled 2023-02-24: qty 30, 5d supply, fill #0

## 2023-02-24 MED ORDER — MOLNUPIRAVIR EUA 200MG CAPSULE
4.0000 | ORAL_CAPSULE | Freq: Two times a day (BID) | ORAL | 0 refills | Status: AC
  Filled 2023-02-24: qty 40, 5d supply, fill #0

## 2023-02-24 NOTE — Telephone Encounter (Signed)
Patient would like to have the Zpak, patient tested positive for Covid     Copied from [place] [id number]. Topic: General - Other >> Feb 24, 2023  9:32 AM Cyndi Bender wrote: Reason for CRM: Pt stated she spoke with [facility] yesterday and she would like Tyria Springer to return her call. Cb# [phone]

## 2023-02-24 NOTE — Telephone Encounter (Signed)
Patient understands about the antibiotic vs Paxlovid.  She is ok with the Paxlovid but wants to know about interaction with other meds.  I told her she would need to hold the Atorvastatin but I was not sure what she should do about her Eliquis.  Please advise and then she would like the Paxlovid and a prescription for Ladona Ridgel sent to [facility]

## 2023-02-24 NOTE — Telephone Encounter (Signed)
Copied from [place] [id number]. Topic: General - Other >> Feb 24, 2023 11:53 AM Kathleen Perkins wrote: Reason for CRM: Pt asked that Jiles Garter be made aware that her cardiologist sent a message telling her to not take Paxlovid but to take Molnupiravir 800 mg every 12 hours

## 2023-02-26 ENCOUNTER — Other Ambulatory Visit: Payer: Self-pay

## 2023-02-26 ENCOUNTER — Other Ambulatory Visit: Payer: Self-pay | Admitting: Cardiovascular Disease

## 2023-02-26 DIAGNOSIS — I480 Paroxysmal atrial fibrillation: Secondary | ICD-10-CM

## 2023-02-26 MED ORDER — APIXABAN 5 MG PO TABS
5.0000 mg | ORAL_TABLET | Freq: Two times a day (BID) | ORAL | 5 refills | Status: DC
  Filled 2023-02-26: qty 60, 30d supply, fill #0
  Filled 2023-03-25: qty 60, 30d supply, fill #1
  Filled 2023-04-30: qty 60, 30d supply, fill #2
  Filled 2023-06-08: qty 60, 30d supply, fill #3
  Filled 2023-07-06: qty 60, 30d supply, fill #4
  Filled 2023-08-19: qty 60, 30d supply, fill #5

## 2023-02-26 NOTE — Telephone Encounter (Signed)
Prescription refill request for Eliquis received. Indication: Afib  Last office visit: 01/07/23 (Nahser)  Scr: 0.81 (09/08/22)  Age: 62 Weight: 85.5kg  Appropriate dose. Refill sent.

## 2023-02-27 ENCOUNTER — Other Ambulatory Visit: Payer: Self-pay

## 2023-02-27 ENCOUNTER — Encounter: Payer: Self-pay | Admitting: Cardiovascular Disease

## 2023-02-27 ENCOUNTER — Other Ambulatory Visit: Payer: Self-pay | Admitting: Family Medicine

## 2023-02-27 MED ORDER — FLECAINIDE ACETATE 50 MG PO TABS: 50.0000 mg | ORAL_TABLET | Freq: Two times a day (BID) | ORAL | 3 refills | Status: DC

## 2023-02-27 MED ORDER — TRAZODONE HCL 100 MG PO TABS
100.0000 mg | ORAL_TABLET | Freq: Every evening | ORAL | 0 refills | Status: DC | PRN
  Filled 2023-02-27: qty 90, 90d supply, fill #0

## 2023-03-02 ENCOUNTER — Encounter: Payer: Self-pay | Admitting: *Deleted

## 2023-03-02 ENCOUNTER — Ambulatory Visit: Payer: Commercial Managed Care - PPO | Admitting: Cardiovascular Disease

## 2023-03-03 ENCOUNTER — Other Ambulatory Visit: Payer: Self-pay

## 2023-03-03 MED FILL — Hydroxyzine HCl Tab 10 MG: ORAL | 10 days supply | Qty: 30 | Fill #1 | Status: CN

## 2023-03-12 ENCOUNTER — Other Ambulatory Visit: Payer: Self-pay

## 2023-03-12 MED FILL — Hydroxyzine HCl Tab 10 MG: ORAL | 10 days supply | Qty: 30 | Fill #1 | Status: AC

## 2023-03-13 ENCOUNTER — Other Ambulatory Visit: Payer: Self-pay

## 2023-03-25 ENCOUNTER — Other Ambulatory Visit: Payer: Self-pay

## 2023-04-03 ENCOUNTER — Other Ambulatory Visit: Payer: Self-pay

## 2023-04-10 ENCOUNTER — Other Ambulatory Visit: Payer: Self-pay

## 2023-04-10 ENCOUNTER — Telehealth: Payer: Self-pay | Admitting: Cardiovascular Disease

## 2023-04-10 MED ORDER — FLECAINIDE ACETATE 50 MG PO TABS
50.0000 mg | ORAL_TABLET | Freq: Two times a day (BID) | ORAL | 3 refills | Status: DC
  Filled 2023-04-10: qty 90, 30d supply, fill #0
  Filled 2023-05-08: qty 90, 30d supply, fill #1
  Filled 2023-07-01: qty 90, 30d supply, fill #2

## 2023-04-10 MED FILL — Hydroxyzine HCl Tab 10 MG: ORAL | 10 days supply | Qty: 30 | Fill #2 | Status: AC

## 2023-04-10 NOTE — Telephone Encounter (Signed)
-----   Message from [name], MD sent at 04/10/2023 10:35 AM EDT ----- Regarding: Flecainide refills Hi Tamera Punt texted me with this request . Will you have a chance to refill her flecainide today .  He probably needs75-80 tabs per month since she takes an extra every now and then.    Thanks   PN   Hey there [name]! I hope all is well. Sorry to bother, but I need a refill on my Flecccanide. We had to [facility] because I needed it on a Saturday but would like it to go back to [facility] pharmacy.  I am still taking bid but sometimes tid if I go into Afib. Thanks [name]!

## 2023-04-10 NOTE — Telephone Encounter (Signed)
Spoke with patient to let her know the Flecainide is at [facility] outpatient pharmacy. She states she just left from picking it up. No further questions.

## 2023-04-17 ENCOUNTER — Ambulatory Visit: Payer: Commercial Managed Care - PPO | Admitting: Family Medicine

## 2023-04-24 ENCOUNTER — Ambulatory Visit: Payer: Commercial Managed Care - PPO | Admitting: Family Medicine

## 2023-04-30 ENCOUNTER — Ambulatory Visit: Payer: Commercial Managed Care - PPO | Admitting: Cardiovascular Disease

## 2023-04-30 ENCOUNTER — Other Ambulatory Visit: Payer: Self-pay

## 2023-04-30 ENCOUNTER — Ambulatory Visit: Payer: Commercial Managed Care - PPO | Admitting: Family Medicine

## 2023-04-30 ENCOUNTER — Other Ambulatory Visit: Payer: Self-pay | Admitting: Family Medicine

## 2023-04-30 MED ORDER — ATORVASTATIN CALCIUM 40 MG PO TABS
40.0000 mg | ORAL_TABLET | Freq: Every day | ORAL | 2 refills | Status: DC
  Filled 2023-04-30: qty 30, 30d supply, fill #0
  Filled 2024-01-05: qty 30, 30d supply, fill #1
  Filled 2024-01-29 (×2): qty 30, 30d supply, fill #2

## 2023-04-30 MED ORDER — LAMOTRIGINE 100 MG PO TABS
100.0000 mg | ORAL_TABLET | Freq: Two times a day (BID) | ORAL | 5 refills | Status: DC
  Filled 2023-04-30 – 2023-05-01 (×2): qty 60, 30d supply, fill #0
  Filled 2023-06-08 (×2): qty 60, 30d supply, fill #1
  Filled 2023-07-06: qty 60, 30d supply, fill #2
  Filled 2023-07-09 – 2023-08-19 (×2): qty 60, 30d supply, fill #3
  Filled 2023-09-14 (×2): qty 60, 30d supply, fill #4
  Filled 2023-10-08 – 2023-10-12 (×4): qty 60, 30d supply, fill #5

## 2023-04-30 MED FILL — Hydroxyzine HCl Tab 10 MG: ORAL | 10 days supply | Qty: 30 | Fill #3 | Status: AC

## 2023-05-01 ENCOUNTER — Other Ambulatory Visit: Payer: Self-pay

## 2023-05-04 ENCOUNTER — Other Ambulatory Visit: Payer: Self-pay

## 2023-05-08 ENCOUNTER — Other Ambulatory Visit: Payer: Self-pay

## 2023-05-08 ENCOUNTER — Other Ambulatory Visit: Payer: Self-pay | Admitting: Family Medicine

## 2023-05-14 ENCOUNTER — Other Ambulatory Visit: Payer: Self-pay | Admitting: Family Medicine

## 2023-05-14 ENCOUNTER — Other Ambulatory Visit: Payer: Self-pay

## 2023-05-14 MED ORDER — TRAZODONE HCL 100 MG PO TABS
100.0000 mg | ORAL_TABLET | Freq: Every evening | ORAL | 0 refills | Status: DC | PRN
  Filled 2023-05-14 – 2023-05-18 (×2): qty 90, 90d supply, fill #0

## 2023-05-18 ENCOUNTER — Other Ambulatory Visit: Payer: Self-pay

## 2023-05-28 ENCOUNTER — Encounter: Payer: Self-pay | Admitting: Pharmacist

## 2023-05-28 ENCOUNTER — Other Ambulatory Visit: Payer: Self-pay

## 2023-05-28 ENCOUNTER — Ambulatory Visit (INDEPENDENT_AMBULATORY_CARE_PROVIDER_SITE_OTHER): Payer: Commercial Managed Care - PPO | Admitting: Family Medicine

## 2023-05-28 ENCOUNTER — Encounter: Payer: Self-pay | Admitting: Family Medicine

## 2023-05-28 VITALS — BP 109/70 | HR 65 | Temp 97.6°F | Resp 12 | Ht 64.0 in | Wt 179.5 lb

## 2023-05-28 DIAGNOSIS — F419 Anxiety disorder, unspecified: Secondary | ICD-10-CM | POA: Diagnosis not present

## 2023-05-28 DIAGNOSIS — E669 Obesity, unspecified: Secondary | ICD-10-CM | POA: Diagnosis not present

## 2023-05-28 MED ORDER — TIRZEPATIDE 5 MG/0.5ML ~~LOC~~ SOAJ
5.0000 mg | SUBCUTANEOUS | 5 refills | Status: DC
  Filled 2023-05-28: qty 6, 84d supply, fill #0

## 2023-05-28 MED ORDER — TIRZEPATIDE-WEIGHT MANAGEMENT 5 MG/0.5ML ~~LOC~~ SOAJ
5.0000 mg | SUBCUTANEOUS | 5 refills | Status: DC
  Filled 2023-05-28 – 2024-01-05 (×3): qty 2, 28d supply, fill #0
  Filled 2024-03-28 – 2024-05-09 (×2): qty 2, 28d supply, fill #1

## 2023-05-28 MED ORDER — TIRZEPATIDE-WEIGHT MANAGEMENT 2.5 MG/0.5ML ~~LOC~~ SOAJ
2.5000 mg | SUBCUTANEOUS | 0 refills | Status: DC
  Filled 2023-05-28 – 2023-07-02 (×2): qty 2, 28d supply, fill #0

## 2023-05-28 NOTE — Progress Notes (Signed)
I,Sulibeya S Dimas,acting as a scribe for Shirlee Latch, MD.,have documented all relevant documentation on the behalf of Shirlee Latch, MD,as directed by  Shirlee Latch, MD while in the presence of Shirlee Latch, MD.     Established patient visit   Patient: Kathleen Perkins   DOB: 31-Aug-1961   62 y.o. Female  MRN: [id number] Visit Date: 05/28/2023  Today's healthcare provider: Shirlee Latch, MD   Chief Complaint  Patient presents with   Obesity   Subjective    HPI  Follow up for Obesity  The patient was last seen for this 3 months ago. Changes made at last visit include no changes.  Patient reports last used wegovy in March. Patients insurance will no longer approve medication. Patient requesting anther medication to help with weight loss.  Patient reports low carb and smaller meals.  Patient reports walking 3-4 times a week. 30-45 minutes a day.   Wt Readings from Last 3 Encounters:  05/28/23 179 lb 8 oz (81.4 kg)  02/02/23 187 lb 14.4 oz (85.2 kg)  01/07/23 191 lb 6.4 oz (86.8 kg)    Discussed the use of AI scribe software for clinical note transcription with the patient, who gave verbal consent to proceed.  History of Present Illness   The patient, with a history of weight issues, presents for a follow-up visit regarding her weight loss journey. She reports discontinuing Wegovy, a weight loss medication, about six weeks ago due to insurance coverage issues. Despite this, the patient has managed to lose weight, with a decrease from 187 lbs in March to 179 lbs at the current visit. She attributes this weight loss to better dietary choices and increased physical activity. She expresses frustration with the high cost of weight loss medications and the lack of insurance coverage. The patient also discusses her work stress, noting an increase in workload and the impact on her mental health. She reports increased use of Klonopin to manage her stress and anxiety.  The patient is considering other options for weight loss medications and is open to trying Zepbound, despite the high cost.        Medications: Outpatient Medications Prior to Visit  Medication Sig   apixaban (ELIQUIS) 5 MG TABS tablet Take 1 tablet (5 mg total) by mouth 2 (two) times daily.   atorvastatin (LIPITOR) 40 MG tablet Take 1 tablet (40 mg total) by mouth daily.   clonazePAM (KLONOPIN) 0.5 MG tablet TAKE ONE TABLET BY MOUTH TWICE A DAY AS NEEDED FOR ANXIETY   digoxin (LANOXIN) 0.125 MG tablet Take 1 tablet (0.125 mg total) by mouth daily.   diltiazem (CARDIZEM CD) 120 MG 24 hr capsule Take 1 capsule (120 mg total) by mouth daily.   flecainide (TAMBOCOR) 50 MG tablet Take 1 tablet (50 mg total) by mouth 2 (two) times daily. May take additional tablet daily as need for breakthrough atrial fib   flecainide (TAMBOCOR) 50 MG tablet Take 1 tablet (50 mg total) by mouth 2 (two) times daily. May take additional tablet daily as needed for breakthrough atrial fib.   folic acid (FOLVITE) 1 MG tablet Take 1 tablet (1 mg total) by mouth daily.   hydrOXYzine (ATARAX) 10 MG tablet Take 1 tablet (10 mg total) by mouth 3 (three) times daily as needed.   lamoTRIgine (LAMICTAL) 100 MG tablet Take 1 tablet (100 mg total) by mouth 2 (two) times daily.   metoprolol tartrate (LOPRESSOR) 25 MG tablet Take 1 tablet (25 mg total) by mouth 2 (  two) times daily. May take an additional tablet daily for breakthrough atrial fib   propranolol (INDERAL) 10 MG tablet Take 1 tablet by mouth up to four times daily as needed for breakthrough episodes of Atrial Fibrillation   Semaglutide-Weight Management 1.7 MG/0.75ML SOAJ Inject 1.7 mg into the skin once a week for 28 days.   thiamine (VITAMIN B-1) 100 MG tablet Take 1 tablet (100 mg total) by mouth daily.   traZODone (DESYREL) 100 MG tablet Take 1 tablet (100 mg total) by mouth at bedtime as needed.   [DISCONTINUED] benzonatate (TESSALON) 100 MG capsule Take 1 capsule  (100 mg total) by mouth 2 (two) times daily as needed for cough.   [DISCONTINUED] Semaglutide-Weight Management 1 MG/0.5ML SOAJ Inject 1 mg into the skin once a week. (Patient not taking: Reported on 05/28/2023)   No facility-administered medications prior to visit.    Review of Systems per HPI     Objective    BP 109/70 (BP Location: Left Arm, Patient Position: Sitting, Cuff Size: Normal)   Pulse 65   Temp 97.6 F (36.4 C) (Temporal)   Resp 12   Ht 5\' 4"  (1.626 m)   Wt 179 lb 8 oz (81.4 kg)   SpO2 98%   BMI 30.81 kg/m  BP Readings from Last 3 Encounters:  05/28/23 109/70  02/02/23 126/86  01/07/23 134/80   Wt Readings from Last 3 Encounters:  05/28/23 179 lb 8 oz (81.4 kg)  02/02/23 187 lb 14.4 oz (85.2 kg)  01/07/23 191 lb 6.4 oz (86.8 kg)      Physical Exam Vitals reviewed.  Constitutional:      General: She is not in acute distress.    Appearance: Normal appearance. She is well-developed. She is not diaphoretic.  HENT:     Head: Normocephalic and atraumatic.  Eyes:     General: No scleral icterus.    Conjunctiva/sclera: Conjunctivae normal.  Neck:     Thyroid: No thyromegaly.  Cardiovascular:     Rate and Rhythm: Normal rate and regular rhythm.     Pulses: Normal pulses.     Heart sounds: Normal heart sounds. No murmur heard. Pulmonary:     Effort: Pulmonary effort is normal. No respiratory distress.     Breath sounds: Normal breath sounds. No wheezing, rhonchi or rales.  Musculoskeletal:     Cervical back: Neck supple.     Right lower leg: No edema.     Left lower leg: No edema.  Lymphadenopathy:     Cervical: No cervical adenopathy.  Skin:    General: Skin is warm and dry.     Findings: No rash.  Neurological:     Mental Status: She is alert and oriented to person, place, and time. Mental status is at baseline.  Psychiatric:        Mood and Affect: Mood normal.        Behavior: Behavior normal.       No results found for any visits on  05/28/23.  Assessment & Plan     Problem List Items Addressed This Visit       Other   Anxiety   Obesity (BMI 30-39.9) - Primary   Relevant Medications   tirzepatide (ZEPBOUND) 2.5 MG/0.5ML Pen   tirzepatide (ZEPBOUND) 5 MG/0.5ML Pen       Obesity: Successful weight loss with [id number], but discontinued due to insurance coverage. Discussed alternatives including Zepbound/Mounjaro despite high cost. Patient has made dietary changes and is motivated to maintain weight  loss. -Start Zepbound 2.5mg  weekly for 4 weeks, then increase to 5mg  weekly. -Check weight and reassess in 3 months.  Anxiety: Reports increased stress at work and occasional use of Klonopin. -Continue current management. Encourage stress management techniques.  General Health Maintenance: -Continue healthy dietary habits and regular exercise.        Return in about 3 months (around 08/28/2023) for chronic disease f/u.      I, Shirlee Latch, MD, have reviewed all documentation for this visit. The documentation on 05/28/23 for the exam, diagnosis, procedures, and orders are all accurate and complete.   Shravan Salahuddin, Marzella Schlein, MD, MPH [facility]

## 2023-05-29 ENCOUNTER — Other Ambulatory Visit: Payer: Self-pay

## 2023-05-29 MED ORDER — AMOXICILLIN 500 MG PO CAPS
2000.0000 mg | ORAL_CAPSULE | Freq: Once | ORAL | 0 refills | Status: AC
  Filled 2023-05-29: qty 12, 30d supply, fill #0

## 2023-06-08 ENCOUNTER — Other Ambulatory Visit: Payer: Self-pay

## 2023-06-09 ENCOUNTER — Other Ambulatory Visit: Payer: Self-pay

## 2023-06-10 ENCOUNTER — Other Ambulatory Visit: Payer: Self-pay

## 2023-07-01 ENCOUNTER — Other Ambulatory Visit: Payer: Self-pay

## 2023-07-02 ENCOUNTER — Other Ambulatory Visit: Payer: Self-pay

## 2023-07-02 ENCOUNTER — Other Ambulatory Visit: Payer: Self-pay | Admitting: Family Medicine

## 2023-07-03 ENCOUNTER — Other Ambulatory Visit (HOSPITAL_COMMUNITY): Payer: Self-pay

## 2023-07-03 ENCOUNTER — Other Ambulatory Visit: Payer: Self-pay

## 2023-07-03 MED FILL — Hydroxyzine HCl Tab 10 MG: ORAL | 10 days supply | Qty: 30 | Fill #0 | Status: AC

## 2023-07-03 NOTE — Telephone Encounter (Signed)
Rx was not transmitted on 07/02/2023 so I resubmitted it.  Requested Prescriptions  Pending Prescriptions Disp Refills   hydrOXYzine (ATARAX) 10 MG tablet 30 tablet 3    Sig: Take 1 tablet (10 mg total) by mouth 3 (three) times daily as needed.     Ear, Nose, and Throat:  Antihistamines 2 Passed - 07/02/2023  2:59 PM      Passed - Cr in normal range and within 360 days    Creatinine, Ser  Date Value Ref Range Status  09/08/2022 0.81 0.57 - 1.00 mg/dL Final         Passed - Valid encounter within last 12 months    Recent Outpatient Visits           1 month ago Obesity (BMI 30-39.9)   [facility] [place], Marzella Schlein, MD   4 months ago COVID-19   [facility] [place], [place], [place]   5 months ago Class 1 obesity without serious comorbidity with body mass index (BMI) of 32.0 to 32.9 in adult, unspecified obesity type   [facility] [place], Marzella Schlein, MD   6 months ago Encounter for annual physical exam   [facility] [place], Marzella Schlein, MD   9 months ago History of embolic stroke   [facility] [place], Marzella Schlein, MD       Future Appointments             In 3 days Nahser, Deloris Ping, MD [facility]   In 3 days Bacigalupo, Marzella Schlein, MD [facility], [facility]

## 2023-07-05 ENCOUNTER — Encounter: Payer: Self-pay | Admitting: Cardiovascular Disease

## 2023-07-05 NOTE — Progress Notes (Unsigned)
Cardiology Office Note:    Date:  07/06/2023   ID:  Kathleen Perkins, DOB July 01, 1961, MRN [id number]  PCP:  Erasmo Downer, MD  [facility] Cardiologist:  [facility] Electrophysiologist:  None   Referring MD: Merwyn Katos, MD   Chief Complaint  Patient presents with   Atrial Fibrillation          Jan. 7, 2022   Kathleen Perkins is a 62 y.o. female with a hx of palpitations.  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  Has had palpitations - was started on Verapamil Then atenolol  Worse with menapause  palps seem to be worse with stress Episodes of tachycardia  - would last perhaps 30 minutes   Exercises intermittantly.  Did well around 2017 for years , not so much this past year Really enjoyed exercising.  Admits that her palpitations   Tries to avoid caffeine,   Has bipolar - tends to be more manic than depressed.   Oct. 9, 2023 Kathleen Perkins seen today for follow up of her mitral regurgitation, hyperlipidemia, and recent diagnosis of atrial fib and a recent stroke   Lipids from 08/23/22 : Chol = 223 HDL = 87 LDL = 113 Trigs = 117  These most recent lipids have improved on Atorvastatin since her previous labs in Dec. 2021  She was admitted to [facility] ( 9/25, 2023) several weeks ago  with an acute stroke and atrial fib.  Received TNK  Had a severe contrast allergy with the head CT .  Improved significantly   Echo shows normal LV function   Has noticed more palpitations   Had an ER visit on aug. 29, 2023  for chest pain  Her father and Clementeen Graham (brother ) had CABG  Brother Genevie Cheshire is doing well   Occasional episodes of apprehension    Has had paroxysmal atrial fib - has been persistent for the past 2 weeks.    The plan is to continue anticoagulation for the next week Schedule TEE / cardioversion .  I  specifically would like to do a TEE prior to the CV since she is just 2 weeks out from a stroke .  If she converts to sinus rhythm before or after her scheduled cardioversion,  she is to start taking Flecainide 50 mg BID.  Wt is 191 lbs    Feb. 7, 2024 Kathleen Perkins is seen for follow up of her PAF and stroke  ( prior to Afib diagnosis)   She is on Flecainide Had no QRS widening on her GXT  Wt is   She's upset over her weight.    Had an episode of PAF last night Lasted ~ 1 - 1.5 hours .  She had not taken her meds ( no flecainide , no metoprolol ) HR 130-150   She takes an extra propranolol when she has PAF Occasionally will take an extra flecainide 50 mg for breakthrough PAF  She also takes an extra metoprolol 25 mg for breakthrough PAF    Wants to lose 7 lbs before next    Aug. 8 2024 Kathleen Perkins is seen for foll up of her paroxysmal atrial fibrillation and stroke.  She is now on flecainide. She has a strong family hx of CAD  Wt is 175 ( down 16 lbs )  Was on Wegovy, now on generic Mongero  Takes an additional flecainide and metoprolol when she has PAF  Typically needs an extra when she is busy at work   Will check ECG since she is having more palps and taking more flecainide   Has occasional episodes of chest pain  LDL is 113 Takes her atorvastatin perhaps 3 days a week  Some exercise ,   Past Medical History:  Diagnosis Date   Anxiety    Cerebrovascular accident (CVA) (HCC) 08/26/2022   Melanoma of upper arm (HCC)    MVP (mitral valve prolapse)    Primary osteoarthritis of left knee 08/15/2014    Past Surgical History:  Procedure Laterality Date   ARTHROSCOPIC REPAIR ACL     BREAST BIOPSY Left 06/23/2013   u/s bx- neg   BREAST EXCISIONAL BIOPSY Left 07/14/2013   us/bx-neg   MELANOMA EXCISION     left arm.    REPLACEMENT TOTAL KNEE     left     Current Medications: Current Meds  Medication Sig   apixaban (ELIQUIS) 5 MG TABS tablet Take 1 tablet (5 mg total) by  mouth 2 (two) times daily.   atorvastatin (LIPITOR) 40 MG tablet Take 1 tablet (40 mg total) by mouth daily.   clonazePAM (KLONOPIN) 0.5 MG tablet Take 1 tablet (0.5 mg total) by mouth 2 (two) times daily as needed for anxiety.   digoxin (LANOXIN) 0.125 MG tablet Take 1 tablet (0.125 mg total) by mouth daily.   diltiazem (CARDIZEM CD) 120 MG 24 hr capsule Take 1 capsule (120 mg total) by mouth daily.   flecainide (TAMBOCOR) 50 MG tablet Take 1 tablet (50 mg total) by mouth 2 (two) times daily. May take additional tablet daily as need for breakthrough atrial fib   flecainide (TAMBOCOR) 50 MG tablet Take 1 tablet (50 mg total) by mouth 2 (two) times daily. May take additional tablet daily as needed for breakthrough atrial fib.   folic acid (FOLVITE) 1 MG tablet Take 1 tablet (1 mg total) by mouth daily.   hydrOXYzine (ATARAX) 10 MG tablet Take 1 tablet (10 mg total) by mouth 3 (three) times daily as needed.   lamoTRIgine (LAMICTAL) 100 MG tablet Take 1 tablet (100 mg total) by mouth 2 (two) times daily. (NDC 450 082 2120)   metoprolol tartrate (LOPRESSOR) 25 MG tablet Take 1 tablet (25 mg total) by mouth 2 (two) times daily. May take an additional tablet daily for breakthrough atrial fib (NDC [address]-29)   propranolol (INDERAL) 10 MG tablet Take 1 tablet by mouth up to four times daily as needed for breakthrough episodes of Atrial Fibrillation   thiamine (VITAMIN B-1) 100 MG tablet Take 1 tablet (100 mg total) by mouth daily.   tirzepatide (ZEPBOUND) 2.5 MG/0.5ML Pen Inject 2.5 mg into the skin once a week.   tirzepatide (ZEPBOUND) 5 MG/0.5ML Pen Inject 5 mg into the skin once a week.   traZODone (DESYREL) 100 MG tablet Take 1 tablet (100 mg total) by mouth at bedtime as needed.     Allergies:   Ivp dye [iodinated contrast media], Codeine, Erythromycin, Nsaids, and Ibuprofen   Social History   Socioeconomic History   Marital status: Divorced    Spouse name: Not on file   Number of  children: Not on file   Years of education: Not on file   Highest education level: Not on file  Occupational History   Not on file  Tobacco Use   Smoking status: Never  Smokeless tobacco: Never  Substance and Sexual Activity   Alcohol use: No   Drug use: No   Sexual activity: Not on file  Other Topics Concern   Not on file  Social History Narrative   Not on file   Social Determinants of Health   Financial Resource Strain: Not on file  Food Insecurity: Not on file  Transportation Needs: Not on file  Physical Activity: Not on file  Stress: Not on file  Social Connections: Not on file     Family History: The patient's family history includes Heart attack in her brother; Heart disease in her brother and father; Hyperlipidemia in her brother and father; Hypertension in her brother and father. There is no history of Breast cancer.  ROS:   Please see the history of present illness.     All other systems reviewed and are negative.  EKGs/Labs/Other Studies Reviewed:    The following studies were reviewed today:  EKG Interpretation Date/Time:  Monday July 06 2023 13:25:03 EDT Ventricular Rate:  68 PR Interval:  202 QRS Duration:  86 QT Interval:  412 QTC Calculation: 438 R Axis:   86  Text Interpretation: Normal sinus rhythm poor R wave progression - likely lead placement When compared with ECG of 26-Aug-2022 11:25, no significant changes compared to previous ecg Confirmed by Kristeen Miss [phone]) on 07/06/2023 1:57:52 PM     Recent Labs: 08/26/2022: ALT 19; B Natriuretic Peptide 327.3; TSH 0.443 08/29/2022: Magnesium 2.5 09/08/2022: BUN 8; Creatinine, Ser 0.81; Hemoglobin 14.1; Platelets 337; Potassium 4.4; Sodium 142  Recent Lipid Panel    Component Value Date/Time   CHOL 223 (H) 08/26/2022 0409   CHOL 268 (H) 11/08/2020 1423   TRIG 117 08/26/2022 0409   HDL 87 08/26/2022 0409   HDL 93 11/08/2020 1423   CHOLHDL 2.6 08/26/2022 0409   VLDL 23 08/26/2022 0409    LDLCALC 113 (H) 08/26/2022 0409   LDLCALC 145 (H) 11/08/2020 1423     Risk Assessment/Calculations:       Physical Exam:     Physical Exam: Blood pressure 108/74, pulse 69, height 5\' 4"  (1.626 m), weight 175 lb 3.2 oz (79.5 kg), SpO2 98%.       GEN:  Well nourished, well developed in no acute distress HEENT: Normal NECK: No JVD; No carotid bruits LYMPHATICS: No lymphadenopathy CARDIAC: RRR  ver very soft systolic murmur  RESPIRATORY:  Clear to auscultation without rales, wheezing or rhonchi  ABDOMEN: Soft, non-tender, non-distended MUSCULOSKELETAL:  No edema; No deformity  SKIN: Warm and dry NEUROLOGIC:  Alert and oriented x 3    ASSESSMENT:    1. Atrial fibrillation with RVR (HCC)   2. Paroxysmal atrial fibrillation (HCC)   3. Mixed hyperlipidemia      PLAN:      Paroxysmal atrial fib: A-fib is overall very well-controlled.  She does have occasional breakthroughs of PAF about 3 times a week.  She takes an extra flecainide 50 mg tablets and an extra metoprolol 25 mg when these happen.  Will refill her flecainide and metoprolol to allow her to take some extra medications on an as-needed basis. We once again discussed A-fib ablation.     On occasion she will take an extra propranolol instead of extra flecainide     2.  Hyperlipidemia:  Her LDL from August 26, 2022 is 113. She has a family history of coronary artery disease-her father and her older brother both had coronary artery bypass grafting.   She denies  any chest pain  She will let us know if she has any worrisone symptoms        Medication Adjustments/Labs and Tests Ordered: Current medicines are reviewed at length with the patient today.  Concerns regarding medicines are outlined above.  Orders Placed This Encounter  Procedures   Lipid panel   ALT   Basic metabolic panel   EKG 12-Lead   No orders of the defined types were placed in this encounter.   Patient Instructions  Medication  Instructions:  Your physician recommends that you continue on your current medications as directed. Please refer to the Current Medication list given to you today.  *If you need a refill on your cardiac medications before your next appointment, please call your pharmacy*   Lab Work: Lipids, ALT, BMET today If you have labs (blood work) drawn today and your tests are completely normal, you will receive your results only by: MyChart Message (if you have MyChart) OR A paper copy in the mail If you have any lab test that is abnormal or we need to change your treatment, we will call you to review the results.   Testing/Procedures: NONE   Follow-Up: At [facility], you and your health needs are our priority.  As part of our continuing mission to provide you with exceptional heart care, we have created designated Provider Care Teams.  These Care Teams include your primary Cardiologist (physician) and Advanced Practice Providers (APPs -  Physician Assistants and Nurse Practitioners) who all work together to provide you with the care you need, when you need it.  We recommend signing up for the patient portal called "MyChart".  Sign up information is provided on this After Visit Summary.  MyChart is used to connect with patients for Virtual Visits (Telemedicine).  Patients are able to view lab/test results, encounter notes, upcoming appointments, etc.  Non-urgent messages can be sent to your provider as well.   To learn more about what you can do with MyChart, go to [email or web address].    Your next appointment:   6 month(s)  Provider:   Kristeen Miss, MD        Signed, Kristeen Miss, MD  07/06/2023 2:01 PM    [facility]

## 2023-07-06 ENCOUNTER — Encounter: Payer: Self-pay | Admitting: Cardiovascular Disease

## 2023-07-06 ENCOUNTER — Other Ambulatory Visit: Payer: Self-pay

## 2023-07-06 ENCOUNTER — Ambulatory Visit: Payer: Commercial Managed Care - PPO | Attending: Cardiovascular Disease | Admitting: Cardiovascular Disease

## 2023-07-06 ENCOUNTER — Other Ambulatory Visit: Payer: Self-pay | Admitting: Family Medicine

## 2023-07-06 ENCOUNTER — Ambulatory Visit: Payer: Commercial Managed Care - PPO | Admitting: Family Medicine

## 2023-07-06 VITALS — BP 108/74 | HR 69 | Ht 64.0 in | Wt 175.2 lb

## 2023-07-06 DIAGNOSIS — I480 Paroxysmal atrial fibrillation: Secondary | ICD-10-CM

## 2023-07-06 DIAGNOSIS — I4891 Unspecified atrial fibrillation: Secondary | ICD-10-CM | POA: Diagnosis not present

## 2023-07-06 DIAGNOSIS — E782 Mixed hyperlipidemia: Secondary | ICD-10-CM | POA: Diagnosis not present

## 2023-07-06 MED ORDER — CLONAZEPAM 0.5 MG PO TABS
0.5000 mg | ORAL_TABLET | Freq: Two times a day (BID) | ORAL | 1 refills | Status: DC | PRN
  Filled 2023-07-06: qty 30, 15d supply, fill #0
  Filled 2023-08-19: qty 30, 30d supply, fill #1

## 2023-07-06 NOTE — Telephone Encounter (Signed)
Medication Refill - Medication:  clonazePAM (KLONOPIN) 0.5 MG tablet Pt is calling to see if she can get this prescription today since she is going to the pharmacy to pick up her other medications today.    Has the patient contacted their pharmacy? No.   Preferred Pharmacy (with phone number or street name): [facility]  Phone: [phone] Fax: [phone]  Has the patient been seen for an appointment in the last year OR does the patient have an upcoming appointment? Yes.    Agent: Please be advised that RX refills may take up to 3 business days. We ask that you follow-up with your pharmacy.

## 2023-07-06 NOTE — Telephone Encounter (Signed)
Requested medication (s) are due for refill today: Yes  Requested medication (s) are on the active medication list: Yes  Last refill:  05/14/23  Future visit scheduled: Yes  Notes to clinic:  Unable to refill per protocol, cannot delegate.      Requested Prescriptions  Pending Prescriptions Disp Refills   clonazePAM (KLONOPIN) 0.5 MG tablet 30 tablet 0     Not Delegated - Psychiatry: Anxiolytics/Hypnotics 2 Failed - 07/06/2023 10:44 AM      Failed - This refill cannot be delegated      Passed - Urine Drug Screen completed in last 360 days      Passed - Patient is not pregnant      Passed - Valid encounter within last 6 months    Recent Outpatient Visits           1 month ago Obesity (BMI 30-39.9)   [facility] [place], Marzella Schlein, MD   4 months ago COVID-19   [facility] [place], [place], [place]   5 months ago Class 1 obesity without serious comorbidity with body mass index (BMI) of 32.0 to 32.9 in adult, unspecified obesity type   [facility] [place], Marzella Schlein, MD   6 months ago Encounter for annual physical exam   [facility] [place], Marzella Schlein, MD   9 months ago History of embolic stroke   [facility], Marzella Schlein, MD       Future Appointments             In 4 days Bacigalupo, Marzella Schlein, MD [facility], [facility]

## 2023-07-06 NOTE — Patient Instructions (Signed)
Medication Instructions:  Your physician recommends that you continue on your current medications as directed. Please refer to the Current Medication list given to you today.  *If you need a refill on your cardiac medications before your next appointment, please call your pharmacy*   Lab Work: Lipids, ALT, BMET today If you have labs (blood work) drawn today and your tests are completely normal, you will receive your results only by: MyChart Message (if you have MyChart) OR A paper copy in the mail If you have any lab test that is abnormal or we need to change your treatment, we will call you to review the results.   Testing/Procedures: NONE   Follow-Up: At [facility], you and your health needs are our priority.  As part of our continuing mission to provide you with exceptional heart care, we have created designated Provider Care Teams.  These Care Teams include your primary Cardiologist (physician) and Advanced Practice Providers (APPs -  Physician Assistants and Nurse Practitioners) who all work together to provide you with the care you need, when you need it.  We recommend signing up for the patient portal called "MyChart".  Sign up information is provided on this After Visit Summary.  MyChart is used to connect with patients for Virtual Visits (Telemedicine).  Patients are able to view lab/test results, encounter notes, upcoming appointments, etc.  Non-urgent messages can be sent to your provider as well.   To learn more about what you can do with MyChart, go to [email or web address].    Your next appointment:   6 month(s)  Provider:   Kristeen Miss, MD

## 2023-07-09 ENCOUNTER — Other Ambulatory Visit: Payer: Self-pay

## 2023-07-10 ENCOUNTER — Ambulatory Visit: Payer: Commercial Managed Care - PPO | Admitting: Family Medicine

## 2023-07-10 ENCOUNTER — Telehealth: Payer: Self-pay

## 2023-07-10 ENCOUNTER — Other Ambulatory Visit: Payer: Self-pay | Admitting: Family Medicine

## 2023-07-10 DIAGNOSIS — Z1231 Encounter for screening mammogram for malignant neoplasm of breast: Secondary | ICD-10-CM

## 2023-07-10 NOTE — Telephone Encounter (Signed)
Copied from CRM [id number]. Topic: General - Other >> Jul 09, 2023  5:39 PM Santiya F wrote: Reason for CRM: Pt is calling in because she no longer has breast pain, but she does want her regular yearly mammogram and wants to know if she can get a referral to have that done.

## 2023-07-13 ENCOUNTER — Other Ambulatory Visit: Payer: Self-pay

## 2023-07-13 NOTE — Telephone Encounter (Signed)
Patient advised to call [place] for appointment.

## 2023-07-16 ENCOUNTER — Other Ambulatory Visit: Payer: Self-pay

## 2023-07-22 ENCOUNTER — Other Ambulatory Visit: Payer: Self-pay

## 2023-07-24 ENCOUNTER — Telehealth: Payer: Self-pay

## 2023-07-24 NOTE — Telephone Encounter (Signed)
Can offer Mon appt instead of Tues if there is something available.

## 2023-07-24 NOTE — Telephone Encounter (Signed)
Called pt to offer an appointment on Monday. Left vm to call back to schedule.  Okay for [facility] to offer appt for Monday

## 2023-07-24 NOTE — Telephone Encounter (Signed)
Copied from CRM [id number]. Topic: General - Other >> Jul 24, 2023  8:36 AM Phill Myron wrote: Ms Hosterman would like a call back regarding FMLA paperwork and maybe trying to get in on Monday instead of Tuesday.

## 2023-07-29 ENCOUNTER — Other Ambulatory Visit: Payer: Self-pay

## 2023-07-29 ENCOUNTER — Other Ambulatory Visit: Payer: Self-pay | Admitting: Cardiovascular Disease

## 2023-07-29 ENCOUNTER — Other Ambulatory Visit: Payer: Self-pay | Admitting: Family Medicine

## 2023-07-29 MED ORDER — FLECAINIDE ACETATE 50 MG PO TABS
50.0000 mg | ORAL_TABLET | Freq: Two times a day (BID) | ORAL | 3 refills | Status: DC
  Filled 2023-07-29: qty 225, 90d supply, fill #0
  Filled 2023-10-15: qty 225, 90d supply, fill #1

## 2023-07-30 ENCOUNTER — Other Ambulatory Visit: Payer: Self-pay

## 2023-07-30 ENCOUNTER — Ambulatory Visit: Payer: Commercial Managed Care - PPO | Admitting: Family Medicine

## 2023-07-30 MED ORDER — TRAZODONE HCL 100 MG PO TABS
100.0000 mg | ORAL_TABLET | Freq: Every evening | ORAL | 0 refills | Status: DC | PRN
  Filled 2023-07-30 (×2): qty 90, 90d supply, fill #0
  Filled 2023-07-31: qty 30, 30d supply, fill #0

## 2023-07-31 ENCOUNTER — Ambulatory Visit: Admission: RE | Admit: 2023-07-31 | Payer: Commercial Managed Care - PPO | Source: Ambulatory Visit

## 2023-07-31 ENCOUNTER — Other Ambulatory Visit: Payer: Self-pay | Admitting: Cardiovascular Disease

## 2023-07-31 ENCOUNTER — Other Ambulatory Visit: Payer: Self-pay

## 2023-07-31 ENCOUNTER — Ambulatory Visit: Payer: Commercial Managed Care - PPO | Admitting: Family Medicine

## 2023-07-31 ENCOUNTER — Ambulatory Visit
Admission: RE | Admit: 2023-07-31 | Discharge: 2023-07-31 | Disposition: A | Discharge status: Home / Self Care | Payer: Commercial Managed Care - PPO | Source: Ambulatory Visit | Attending: Family Medicine | Admitting: Family Medicine

## 2023-07-31 ENCOUNTER — Encounter: Payer: Self-pay | Admitting: Family Medicine

## 2023-07-31 VITALS — BP 118/81 | HR 97 | Ht 64.0 in | Wt 171.4 lb

## 2023-07-31 DIAGNOSIS — R609 Edema, unspecified: Secondary | ICD-10-CM | POA: Diagnosis not present

## 2023-07-31 DIAGNOSIS — M7661 Achilles tendinitis, right leg: Secondary | ICD-10-CM | POA: Diagnosis not present

## 2023-07-31 DIAGNOSIS — M25471 Effusion, right ankle: Secondary | ICD-10-CM | POA: Insufficient documentation

## 2023-07-31 DIAGNOSIS — Z043 Encounter for examination and observation following other accident: Secondary | ICD-10-CM | POA: Diagnosis not present

## 2023-07-31 DIAGNOSIS — W19XXXA Unspecified fall, initial encounter: Secondary | ICD-10-CM

## 2023-07-31 DIAGNOSIS — M7731 Calcaneal spur, right foot: Secondary | ICD-10-CM | POA: Insufficient documentation

## 2023-07-31 DIAGNOSIS — M25571 Pain in right ankle and joints of right foot: Secondary | ICD-10-CM | POA: Diagnosis not present

## 2023-07-31 MED ORDER — HYDROCODONE-ACETAMINOPHEN 5-325 MG PO TABS
1.0000 | ORAL_TABLET | Freq: Four times a day (QID) | ORAL | 0 refills | Status: AC | PRN
  Filled 2023-07-31: qty 20, 5d supply, fill #0

## 2023-07-31 NOTE — Telephone Encounter (Signed)
Pt's pharmacy is requesting a refill on propanolol. This medication is no longer on pt's medication list. Does pt supposed to still be taking this medication? Please address

## 2023-07-31 NOTE — Progress Notes (Signed)
   Acute Office Visit  Subjective:     Patient ID: Kathleen Perkins, female    DOB: [birth year], 62 y.o.   MRN: [id number]  Chief Complaint  Patient presents with   Ankle Injury    Right ankle injury X last Thursday. Due to stepping off porch into whole dogs had dug. Patient reports icing and placed in a bucket. She reports she has also wrapped it.     HPI Discussed the use of AI scribe software for clinical note transcription with the patient, who gave verbal consent to proceed.  History of Present Illness   The patient, with a history of multiple orthopedic injuries, presents with severe ankle pain after stepping into a hole and twisting her ankle inwards eight days ago. She did not hear a pop at the time of injury, but immediately iced the area and used a compression sleeve. Despite these measures, the pain and swelling have persisted and even worsened, to the point of causing nausea. The pain is so severe that it has prevented her from working and performing self-care tasks. The patient notes that even her toes are painful and bruised. She has been taking Eliquis for an unrelated condition.       ROS per HPI      Objective:    BP 118/81 (BP Location: Left Arm, Patient Position: Sitting, Cuff Size: Normal)   Pulse 97   Ht 5\' 4"  (1.626 m)   Wt 171 lb 6.4 oz (77.7 kg)   SpO2 97%   BMI 29.42 kg/m    Physical Exam  Physical Exam   EXTREMITIES: Bruising and swelling up to the lateral aspect of the ankle and extending up the shin, lateral aspect. Tenderness upon palpation of the lateral aspect of the ankle. Midfoot not tender upon palpation.       No results found for any visits on 07/31/23.      Assessment & Plan:   Problem List Items Addressed This Visit   None Visit Diagnoses     Acute right ankle pain    -  Primary   Relevant Orders   DG Ankle Complete Right   Fall, initial encounter       Relevant Orders   DG Ankle Complete Right   Right ankle swelling        Relevant Orders   DG Ankle Complete Right           Ankle Injury Patient stepped into a hole and rolled her ankle inward 8 days ago. Significant bruising and swelling present, with pain extending up the shin and into the toes. Pain is severe and has not improved significantly with rest, ice, and elevation. Patient is on Eliquis, which may be contributing to the extensive bruising. -Order ankle x-ray to rule out fracture. -Continue rest, ice, and elevation. -Consider compression wrap if patient can tolerate it. -Review x-ray results and determine next steps based on findings. - avoid NSAIDs, Norco prn for severe pain        Meds ordered this encounter  Medications   HYDROcodone-acetaminophen (NORCO) 5-325 MG tablet    Sig: Take 1 tablet by mouth every 6 (six) hours as needed for up to 5 days for moderate pain or severe pain.    Dispense:  20 tablet    Refill:  0    Return if symptoms worsen or fail to improve.  Shirlee Latch, MD

## 2023-08-04 ENCOUNTER — Other Ambulatory Visit: Payer: Self-pay

## 2023-08-04 ENCOUNTER — Other Ambulatory Visit: Payer: Self-pay | Admitting: Cardiovascular Disease

## 2023-08-04 MED FILL — Propranolol HCl Tab 10 MG: ORAL | 30 days supply | Qty: 120 | Fill #0 | Status: CN

## 2023-08-05 ENCOUNTER — Telehealth: Payer: Self-pay

## 2023-08-05 NOTE — Telephone Encounter (Signed)
Patient calling regarding xray results. Patient was told we will call once xray results was review by provider.

## 2023-08-05 NOTE — Telephone Encounter (Signed)
Copied from CRM [id number]. Topic: General - Other >> Aug 05, 2023 10:52 AM Dondra Prader A wrote: Reason for CRM: Pt is calling in to get her results from her x ray that she had sone on Friday for her right ankle. Please advise.

## 2023-08-06 ENCOUNTER — Ambulatory Visit: Payer: Self-pay

## 2023-08-06 NOTE — Telephone Encounter (Signed)
    Chief Complaint: Continued ankle pain."I just want my xray results when Dr. B gets them. No rush." Currently out of town. Symptoms: Above Frequency: Last week Pertinent Negatives: Patient denies  Disposition: [] ED /[] Urgent Care (no appt availability in office) / [] Appointment(In office/virtual)/ []  [facility]/ [] Home Care/ [x] Refused Recommended Disposition /[] [facility]/ [x]  Follow-up with PCP Additional Notes: Please advise pt.  Reason for Disposition  [1] MODERATE pain (e.g., interferes with normal activities, limping) AND [2] present > 3 days  Answer Assessment - Initial Assessment Questions 1. ONSET: "When did the pain start?"      Last week 2. LOCATION: "Where is the pain located?"      Right 3. PAIN: "How bad is the pain?"    (Scale 1-10; or mild, moderate, severe)  - MILD (1-3): doesn't interfere with normal activities.   - MODERATE (4-7): interferes with normal activities (e.g., work or school) or awakens from sleep, limping.   - SEVERE (8-10): excruciating pain, unable to do any normal activities, unable to walk.      7 4. WORK OR EXERCISE: "Has there been any recent work or exercise that involved this part of the body?"      No 5. CAUSE: "What do you think is causing the ankle pain?"     Fall 6. OTHER SYMPTOMS: "Do you have any other symptoms?" (e.g., calf pain, rash, fever, swelling)     Pain, swelling 7. PREGNANCY: "Is there any chance you are pregnant?" "When was your last menstrual period?"     No  Protocols used: Ankle Pain-A-AH

## 2023-08-06 NOTE — Telephone Encounter (Signed)
See result note. Needs ortho referral.

## 2023-08-06 NOTE — Telephone Encounter (Signed)
Patient has called again for these results, she stated it has almost been a week since her xray and she needs to know if she needs to get a cast or not that she is on vacation, please advise and contact patient back @ # [phone]. Patient aware yesterday was Dr Senaida Lange admin day, day off.

## 2023-08-17 ENCOUNTER — Other Ambulatory Visit: Payer: Self-pay

## 2023-08-19 ENCOUNTER — Other Ambulatory Visit: Payer: Self-pay

## 2023-08-19 MED FILL — Hydroxyzine HCl Tab 10 MG: ORAL | 10 days supply | Qty: 30 | Fill #1 | Status: AC

## 2023-08-19 MED FILL — Propranolol HCl Tab 10 MG: ORAL | 30 days supply | Qty: 120 | Fill #0 | Status: AC

## 2023-08-20 ENCOUNTER — Other Ambulatory Visit: Payer: Self-pay

## 2023-08-21 ENCOUNTER — Ambulatory Visit: Payer: Self-pay | Admitting: *Deleted

## 2023-08-21 ENCOUNTER — Other Ambulatory Visit: Payer: Self-pay | Admitting: Family Medicine

## 2023-08-21 ENCOUNTER — Other Ambulatory Visit: Payer: Self-pay

## 2023-08-21 MED ORDER — HYDROCODONE-ACETAMINOPHEN 5-325 MG PO TABS
1.0000 | ORAL_TABLET | Freq: Every day | ORAL | 0 refills | Status: DC
  Filled 2023-08-21: qty 20, 20d supply, fill #0

## 2023-08-21 NOTE — Telephone Encounter (Addendum)
Summary: pain in foot   Patient called stated she is still having pain in her right ankle when she sleep from an incident back in Aug. She is asking for another script of HYDROcodone-acetaminophen so she can get rest after working 12hrs. Please f/u with patient     Attempted to contact patient- no answer- left message to call office

## 2023-08-21 NOTE — Telephone Encounter (Signed)
Patient called, unable to leave VM to return the call to the office to speak to the NT due to phone just ringing, voicemail did not pick-up Summary: pain in foot   Patient called stated she is still having pain in her right ankle when she sleep from an incident back in Aug. She is asking for another script of HYDROcodone-acetaminophen so she can get rest after working 12hrs. Please f/u with patient

## 2023-08-21 NOTE — Telephone Encounter (Signed)
  Chief Complaint: Foot pain - difficulty sleeping - requesting refill of HYDROcodone-acetaminophen (NORCO) 5-325 MG tablet Symptoms: Pain - difficulty sleeping Frequency: ongoing Pertinent Negatives: Patient denies  Disposition: [] ED /[] Urgent Care (no appt availability in office) / [] Appointment(In office/virtual)/ []  [facility]/ [] Home Care/ [] Refused Recommended Disposition /[] [facility]/ [x]  Follow-up with PCP Additional Notes: Pt states that her foot is still painful. She is icing it and using compression, but after a 12 hour shift standing her foot is very painful. Foot is getting better. She is requesting a refill of medication to help with pain in order to sleep. Pt states she only uses medication at [facility]. Please advise.    Summary: pain in foot   Patient called stated she is still having pain in her right ankle when she sleep from an incident back in Aug. She is asking for another script of HYDROcodone-acetaminophen so she can get rest after working 12hrs. Please f/u with patient    Call was answered and hung up. Called pt back.  Reason for Disposition  [1] Prescription refill request for ESSENTIAL medicine (i.e., likelihood of harm to patient if not taken) AND [2] triager unable to refill per department policy  Answer Assessment - Initial Assessment Questions 1. DRUG NAME: "What medicine do you need to have refilled?"     HYDROcodone-acetaminophen (NORCO) 5-325 MG tablet  2. REFILLS REMAINING: "How many refills are remaining?" (Note: The label on the medicine or pill bottle will show how many refills are remaining. If there are no refills remaining, then a renewal may be needed.)     none  4. PRESCRIBING HCP: "Who prescribed it?" Reason: If prescribed by specialist, call should be referred to that group.     Dr. Erven Colla 5. SYMPTOMS: "Do you have any symptoms?"     Pain - trouble sleeping  Protocols used: Medication Refill and Renewal Call-A-AH

## 2023-08-21 NOTE — Telephone Encounter (Signed)
This patient has called again wanting a refill on Hydrocodone.   [facility] [employer].

## 2023-08-24 ENCOUNTER — Ambulatory Visit: Payer: Self-pay

## 2023-08-24 DIAGNOSIS — M25571 Pain in right ankle and joints of right foot: Secondary | ICD-10-CM

## 2023-08-24 NOTE — Telephone Encounter (Signed)
Chief Complaint: Right ankle injury  Symptoms: Ankle pain and swelling after injury  Frequency: Comes and goes Pertinent Negatives: Patient denies re injury  Disposition: [] ED /[] Urgent Care (no appt availability in office) / [] Appointment(In office/virtual)/ []  [place] [place] [facility]/ [] Home Care/ [] Refused Recommended Disposition /[] [facility]/ [x]  Follow-up with PCP Additional Notes: Patient states she fractured her right ankle on 07/21/23. Patient was suppose to go to Ortho for care but has not been yet. 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 Patient stated the NP at her job told her that she needs to see Ortho due to the amount of swelling in the right ankle. Patient received an Rx for pain medication on Friday but is now concerned with the swelling in the ankle. Patient is requesting PCP place a referral for Orth[facility] at [facility] clinic. Patient also stated she will go to [facility] care and requested that PCP send a message with the history of her injury to [facility]. Reason for Disposition  [1] Minor injury or pain from twisting or over-stretching AND [2] walks normally  Answer Assessment - Initial Assessment Questions 1. MECHANISM: "How did the injury happen?" (e.g., twisting injury, direct blow)      Misstep in yard and broke the ankle 2. ONSET: "When did the injury happen?" (Minutes or hours ago)      07/21/23 3. LOCATION: "Where is the injury located?"      Right ankle  4. APPEARANCE of INJURY: "What does the injury look like?"      It is swollen and painful 5. WEIGHT-BEARING: "Can you put weight on that foot?" "Can you walk (four steps or more)?"       Yes  6. SIZE: For cuts, bruises, or swelling, ask: "How large is it?" (e.g., inches or centimeters;  entire joint)      Swollen 7. PAIN: "Is there pain?" If Yes, ask: "How bad is the pain?"    (e.g., Scale 1-10; or mild, moderate, severe)   - NONE (0): no pain.   -  MILD (1-3): doesn't interfere with normal activities.    - MODERATE (4-7): interferes with normal activities (e.g., work or school) or awakens from sleep, limping.    - SEVERE (8-10): excruciating pain, unable to do any normal activities, unable to walk.      5/10  9. OTHER SYMPTOMS: "Do you have any other symptoms?"      Swelling and pain continues  Protocols used: Ankle and Foot Injury-A-AH

## 2023-08-24 NOTE — Telephone Encounter (Signed)
Requested medication (s) are due for refill today: no  Requested medication (s) are on the active medication list: yes  Last refill:  08/21/23 #20 tablets  Future visit scheduled:no  Notes to clinic:  med not delegated to NT to refuse   Requested Prescriptions  Pending Prescriptions Disp Refills   HYDROcodone-acetaminophen (NORCO/VICODIN) 5-325 MG tablet [Pharmacy Med Name: HYDROcodone-acetaminophen (NORCO) 5-325 MG tablet] 20 tablet 0    Sig: Take 1 tablet by mouth every 6 (six) hours as needed for up to 5 days for moderate pain or severe pain.     Not Delegated - Analgesics:  Opioid Agonist Combinations Failed - 08/21/2023  4:50 PM      Failed - This refill cannot be delegated      Failed - Urine Drug Screen completed in last 360 days      Passed - Valid encounter within last 3 months    Recent Outpatient Visits           3 weeks ago Acute right ankle pain   [facility] Erasmo Downer, MD   2 months ago Obesity (BMI 30-39.9)   [facility] [place], Marzella Schlein, MD   6 months ago COVID-19   [facility] [place], [place], [place]   6 months ago Class 1 obesity without serious comorbidity with body mass index (BMI) of 32.0 to 32.9 in adult, unspecified obesity type   [facility] [facility], Marzella Schlein, MD   8 months ago Encounter for annual physical exam   [facility] [place], Marzella Schlein, MD

## 2023-08-26 NOTE — Addendum Note (Signed)
Addended by: Jacquenette Shone on: 08/26/2023 07:33 AM   Modules accepted: Orders

## 2023-09-02 ENCOUNTER — Encounter: Payer: Self-pay | Admitting: *Deleted

## 2023-09-09 ENCOUNTER — Other Ambulatory Visit: Payer: Self-pay | Admitting: Family Medicine

## 2023-09-09 ENCOUNTER — Other Ambulatory Visit: Payer: Self-pay | Admitting: Cardiovascular Disease

## 2023-09-09 ENCOUNTER — Other Ambulatory Visit: Payer: Self-pay

## 2023-09-09 DIAGNOSIS — I480 Paroxysmal atrial fibrillation: Secondary | ICD-10-CM

## 2023-09-09 MED ORDER — APIXABAN 5 MG PO TABS
5.0000 mg | ORAL_TABLET | Freq: Two times a day (BID) | ORAL | 5 refills | Status: DC
Start: 2023-09-09 — End: 2024-03-07
  Filled 2023-09-09 – 2023-09-16 (×2): qty 60, 30d supply, fill #0
  Filled 2023-10-15: qty 60, 30d supply, fill #1
  Filled 2023-11-16: qty 60, 30d supply, fill #2
  Filled 2023-12-07: qty 60, 30d supply, fill #3
  Filled 2024-01-04: qty 60, 30d supply, fill #4

## 2023-09-09 NOTE — Telephone Encounter (Signed)
Prescription refill request for Eliquis received. Indication: Afib  Last office visit: 07/06/23 ([facility])  Scr: 0.81 (09/08/22)  Age: 62 Weight: 77.7kg   Appropriate dose. Refill sent.

## 2023-09-10 ENCOUNTER — Other Ambulatory Visit: Payer: Self-pay

## 2023-09-10 MED ORDER — CLONAZEPAM 0.5 MG PO TABS
0.5000 mg | ORAL_TABLET | Freq: Two times a day (BID) | ORAL | 1 refills | Status: DC | PRN
  Filled 2023-09-10 – 2023-10-15 (×2): qty 30, 15d supply, fill #0
  Filled 2023-11-16: qty 30, 15d supply, fill #1

## 2023-09-10 MED ORDER — TRAZODONE HCL 100 MG PO TABS
100.0000 mg | ORAL_TABLET | Freq: Every evening | ORAL | 0 refills | Status: DC | PRN
  Filled 2023-09-10: qty 90, 90d supply, fill #0

## 2023-09-10 NOTE — Telephone Encounter (Signed)
Requested Prescriptions  Pending Prescriptions Disp Refills   clonazePAM (KLONOPIN) 0.5 MG tablet 30 tablet 1    Sig: Take 1 tablet (0.5 mg total) by mouth 2 (two) times daily as needed for anxiety.     Not Delegated - Psychiatry: Anxiolytics/Hypnotics 2 Failed - 09/09/2023  4:03 PM      Failed - This refill cannot be delegated      Failed - Urine Drug Screen completed in last 360 days      Passed - Patient is not pregnant      Passed - Valid encounter within last 6 months    Recent Outpatient Visits           1 month ago Acute right ankle pain   [facility] Er[facility]o Downer, MD   3 months ago Obesity (BMI 30-39.9)   [facility] [place], Marzella Schlein, MD   6 months ago COVID-19   [facility] [place], [place], [place]   7 months ago Class 1 obesity without serious comorbidity with body mass index (BMI) of 32.0 to 32.9 in adult, unspecified obesity type   [facility] [place], Marzella Schlein, MD   9 months ago Encounter for annual physical exam   [facility] [place], Marzella Schlein, MD               traZODone (DESYREL) 100 MG tablet 90 tablet 0    Sig: Take 1 tablet (100 mg total) by mouth at bedtime as needed.     Psychiatry: Antidepressants - Serotonin Modulator Passed - 09/09/2023  4:03 PM      Passed - Valid encounter within last 6 months    Recent Outpatient Visits           1 month ago Acute right ankle pain   [place] [facility] Erasmo Downer, MD   3 months ago Obesity (BMI 30-39.9)   [facility] [place], Marzella Schlein, MD   6 months ago COVID-19   [facility] [place], [place], [place]   7 months ago Class 1 obesity without serious comorbidity with body mass index (BMI) of 32.0 to 32.9 in adult, unspecified obesity type   [facility]  [place], Marzella Schlein, MD   9 months ago Encounter for annual physical exam   [facility] [place], Marzella Schlein, MD

## 2023-09-10 NOTE — Telephone Encounter (Signed)
Requested medication (s) are due for refill today: yes  Requested medication (s) are on the active medication list: yes  Last refill:  07/06/23  Future visit scheduled: no  Notes to clinic:  Unable to refill per protocol, cannot delegate.      Requested Prescriptions  Pending Prescriptions Disp Refills   clonazePAM (KLONOPIN) 0.5 MG tablet 30 tablet 1    Sig: Take 1 tablet (0.5 mg total) by mouth 2 (two) times daily as needed for anxiety.     Not Delegated - Psychiatry: Anxiolytics/Hypnotics 2 Failed - 09/09/2023  4:03 PM      Failed - This refill cannot be delegated      Failed - Urine Drug Screen completed in last 360 days      Passed - Patient is not pregnant      Passed - Valid encounter within last 6 months    Recent Outpatient Visits           1 month ago Acute right ankle pain   [facility] Er[facility]o Down[facility], MD   3 months ago Obesity (BMI 30-39.9)   [facility] [place], Marzella Schlein, MD   6 months ago COVID-19   [facility] [place], [place], [place]   7 months ago Class 1 obesity without serious comorbidity with body mass index (BMI) of 32.0 to 32.9 in adult, unspecified obesity type   [facility] [place], Marzella Schlein, MD   9 months ago Encounter for annual physical exam   [facility] [place], Marzella Schlein, MD              Signed Prescriptions Disp Refills   traZODone (DESYREL) 100 MG tablet 90 tablet 0    Sig: Take 1 tablet (100 mg total) by mouth at bedtime as needed.     Psychiatry: Antidepressants - Serotonin Modulator Passed - 09/09/2023  4:03 PM      Passed - Valid encounter within last 6 months    Recent Outpatient Visits           1 month ago Acute right ankle pain   [place] [facility] Erasmo Downer, MD   3 months ago Obesity (BMI 30-39.9)   [facility]  [place], Marzella Schlein, MD   6 months ago COVID-19   [facility], [place], [place]   7 months ago Class 1 obesity without serious comorbidity with body mass index (BMI) of 32.0 to 32.9 in adult, unspecified obesity type   [facility], Marzella Schlein, MD   9 months ago Encounter for annual physical exam   [facility] [place], Marzella Schlein, MD

## 2023-09-14 ENCOUNTER — Other Ambulatory Visit: Payer: Self-pay

## 2023-09-14 MED FILL — Propranolol HCl Tab 10 MG: ORAL | 30 days supply | Qty: 120 | Fill #1 | Status: AC

## 2023-09-14 MED FILL — Propranolol HCl Tab 10 MG: ORAL | 30 days supply | Qty: 120 | Fill #1 | Status: CN

## 2023-09-14 MED FILL — Hydroxyzine HCl Tab 10 MG: ORAL | 10 days supply | Qty: 30 | Fill #2 | Status: AC

## 2023-09-14 MED FILL — Hydroxyzine HCl Tab 10 MG: ORAL | 10 days supply | Qty: 30 | Fill #2 | Status: CN

## 2023-09-16 ENCOUNTER — Other Ambulatory Visit: Payer: Self-pay

## 2023-09-16 MED ORDER — AMOXICILLIN 500 MG PO CAPS
2000.0000 mg | ORAL_CAPSULE | ORAL | 0 refills | Status: DC
  Filled 2023-09-16: qty 12, 3d supply, fill #0

## 2023-09-21 ENCOUNTER — Other Ambulatory Visit: Payer: Self-pay

## 2023-10-05 ENCOUNTER — Other Ambulatory Visit: Payer: Self-pay

## 2023-10-08 ENCOUNTER — Other Ambulatory Visit: Payer: Self-pay

## 2023-10-08 MED FILL — Propranolol HCl Tab 10 MG: ORAL | 30 days supply | Qty: 120 | Fill #2 | Status: AC

## 2023-10-09 ENCOUNTER — Other Ambulatory Visit: Payer: Self-pay

## 2023-10-12 ENCOUNTER — Other Ambulatory Visit: Payer: Self-pay

## 2023-10-15 ENCOUNTER — Other Ambulatory Visit: Payer: Self-pay

## 2023-10-15 MED FILL — Hydroxyzine HCl Tab 10 MG: ORAL | 10 days supply | Qty: 30 | Fill #3 | Status: AC

## 2023-10-19 ENCOUNTER — Other Ambulatory Visit: Payer: Self-pay

## 2023-11-05 ENCOUNTER — Telehealth: Payer: Self-pay | Admitting: Family Medicine

## 2023-11-05 NOTE — Telephone Encounter (Signed)
Pt called and requested a handicap tag for parking. She has had a stroke and other health issues. She is stating that her work is requesting the tag for approval to park in that location. Please advise.

## 2023-11-09 DIAGNOSIS — Z0279 Encounter for issue of other medical certificate: Secondary | ICD-10-CM

## 2023-11-09 NOTE — Telephone Encounter (Signed)
Called to notify patient 11/09/2023 that form is at the front desk and ready to be picked up for handicap tag

## 2023-11-11 NOTE — Telephone Encounter (Signed)
Called to notify patient 11/11/2023 that form is at the front desk and ready to be picked up for handicap tag   Patient stated that she will pick it up on 11/12/2023.

## 2023-11-16 ENCOUNTER — Telehealth: Payer: Self-pay

## 2023-11-16 ENCOUNTER — Other Ambulatory Visit: Payer: Self-pay

## 2023-11-16 ENCOUNTER — Other Ambulatory Visit: Payer: Self-pay | Admitting: Cardiovascular Disease

## 2023-11-16 ENCOUNTER — Other Ambulatory Visit: Payer: Self-pay | Admitting: Family Medicine

## 2023-11-16 ENCOUNTER — Telehealth: Payer: Self-pay | Admitting: Cardiovascular Disease

## 2023-11-16 MED ORDER — HYDROXYZINE HCL 10 MG PO TABS
10.0000 mg | ORAL_TABLET | Freq: Three times a day (TID) | ORAL | 3 refills | Status: DC | PRN
  Filled 2023-11-16: qty 30, 10d supply, fill #0
  Filled 2023-12-25: qty 30, 10d supply, fill #1
  Filled 2024-01-29 (×2): qty 30, 10d supply, fill #2
  Filled 2024-02-26: qty 30, 10d supply, fill #3

## 2023-11-16 NOTE — Telephone Encounter (Signed)
 Copied from CRM [id number]. Topic: General - Inquiry >> Nov 16, 2023  2:06 PM Patsy Lager T wrote: Reason for CRM: patient called stated she has ADA paperwork that needs to be completed by provider. Please contact patient if she needs to schedule an appt

## 2023-11-16 NOTE — Telephone Encounter (Signed)
Patient states she's changed dept's at work and is not able to work 12 hour shifts. She has A-fib and has fractured her ankle recently. She states she just feels pushed all the time to get stuff done and speaks of several stressors at work. She is overwhelmed and feels this is all making her worse. She is asking that we fill out paperwork for her to be able to work 8 hours per day only. She will bring the papers by the office once she has reviewed them and filled out her part. Advised that MD is out all next week, then rounding in the hospital. She verbalized understanding.

## 2023-11-16 NOTE — Telephone Encounter (Signed)
Patient is requesting to speak with RN Eugene Gavia in regards to her next steps forward. Please advise.

## 2023-11-17 ENCOUNTER — Other Ambulatory Visit: Payer: Self-pay

## 2023-11-17 MED FILL — Diltiazem HCl Coated Beads Cap ER 24HR 120 MG: ORAL | 90 days supply | Qty: 90 | Fill #0 | Status: AC

## 2023-11-17 NOTE — Telephone Encounter (Signed)
Will need to know what it requires to know whether or not we need to do a visit. Can she drop it off or send it through Mychart so we can determine if visit is needed.

## 2023-11-18 ENCOUNTER — Telehealth: Payer: Self-pay

## 2023-11-18 ENCOUNTER — Telehealth: Payer: Self-pay | Admitting: Family Medicine

## 2023-11-18 ENCOUNTER — Telehealth: Payer: Self-pay | Admitting: Cardiovascular Disease

## 2023-11-18 NOTE — Telephone Encounter (Signed)
Copied from CRM [id number]. Topic: General - Inquiry >> Nov 18, 2023  3:11 PM Patsy Lager T wrote: Reason for CRM: patient wanted providers nurse to know she dropped off paperwork that needs to be completed and would like a call when Dr B returns and has been given the papers.

## 2023-11-18 NOTE — Telephone Encounter (Signed)
Information added to original encounter advising patient to bring in forms.

## 2023-11-18 NOTE — Telephone Encounter (Signed)
Accommodation Certification forms were received on 11/18/2023 and placed in Provider's mailbox. Please allow 7-10 days for completion.

## 2023-11-18 NOTE — Telephone Encounter (Signed)
Pt returning call to nurse

## 2023-11-19 NOTE — Telephone Encounter (Signed)
Pt called in , says needs to change  this info to her paperwork, that she has workplace, stress related afib.

## 2023-11-19 NOTE — Telephone Encounter (Signed)
Patient returned RN's call regarding completing documentation.

## 2023-11-20 NOTE — Telephone Encounter (Signed)
Returned call to patient. She states that she mailed the paperwork to [name], but she needs the paperwork back by 12/07/23. Says she sent via [employer] and should arrive here Monday (11/23/23). She states she has been out of work since 11/13/23 and needs it to cover through 01/07/24. Patient very emotional on phone and states that she is currently applying for other positions within her same company, but cannot bear pressures of current position-physical and emotional-any longer. "A-fib has increased in frequency and duration related to work place stress requiring PRN doses of antiarrhythmic medications, etc" Advised that we will complete paperwork upon receipt and will get MD to sign at his earliest availability.

## 2023-11-26 NOTE — Telephone Encounter (Signed)
Paperwork retrieved, completed, signed by MD and faxed in. Patient made aware. States she would like hard copies mailed to her. Informed her I'd have them scanned to her chart and would mail them out tomorrow. She denies wanting an additional letter at this time from MD, stating "less is more at this point."

## 2023-11-30 ENCOUNTER — Ambulatory Visit: Payer: Commercial Managed Care - PPO | Admitting: Family Medicine

## 2023-12-01 ENCOUNTER — Encounter: Payer: Self-pay | Admitting: Family Medicine

## 2023-12-01 ENCOUNTER — Ambulatory Visit: Payer: Commercial Managed Care - PPO | Admitting: Family Medicine

## 2023-12-01 VITALS — BP 126/75 | HR 63 | Ht 64.0 in | Wt 177.4 lb

## 2023-12-01 DIAGNOSIS — I4891 Unspecified atrial fibrillation: Secondary | ICD-10-CM | POA: Diagnosis not present

## 2023-12-01 DIAGNOSIS — Z0289 Encounter for other administrative examinations: Secondary | ICD-10-CM | POA: Diagnosis not present

## 2023-12-01 NOTE — Progress Notes (Signed)
 Established patient visit   Patient: Kathleen Perkins   DOB: [birth year]   62 y.o. Female  MRN: [id number] Visit Date: 12/01/2023  Today's healthcare provider: Jon Eva, MD   No chief complaint on file.  Subjective    HPI HPI   Pt is present in regards top FMLA paperwork Last edited by Lilian Fitzpatrick, CMA on 12/01/2023 10:00 AM.       Discussed the use of AI scribe software for clinical note transcription with the patient, who gave verbal consent to proceed.  History of Present Illness   The patient, a nurse, presents with a history of atrial fibrillation (Afib) and bipolar disorder. They report experiencing intermittent Afib exacerbations, which they believe are triggered by the high-stress environment of their current job. The patient describes their work environment as increasingly unsafe due to the growing population of mentally ill and substance abuse patients. They express a desire to leave their current position and find a lower stress job within the [employer] [facility] [employer] system.  The patient also reports a recent broken ankle, which has been causing them additional stress. They express concern about the cost of their medication, Eliquis , should they lose their job. The patient also mentions having taken Klonopin  to manage their anxiety at work. They describe their anxiety as manifesting in a manic state, characterized by a push, push, push mentality, akin to consuming five diet [employer].         Medications: Outpatient Medications Prior to Visit  Medication Sig   amoxicillin  (AMOXIL ) 500 MG capsule Take 4 capsules (2,000 mg total) by mouth 1 hour prior to dental appointment   apixaban  (ELIQUIS ) 5 MG TABS tablet Take 1 tablet (5 mg total) by mouth 2 (two) times daily.   clonazePAM  (KLONOPIN ) 0.5 MG tablet Take 1 tablet (0.5 mg total) by mouth 2 (two) times daily as needed for anxiety.   digoxin  (LANOXIN ) 0.125 MG tablet Take 1 tablet (0.125 mg total) by mouth  daily.   diltiazem  (CARDIZEM  CD) 120 MG 24 hr capsule Take 1 capsule (120 mg total) by mouth daily.   flecainide  (TAMBOCOR ) 50 MG tablet Take 1 tablet (50 mg total) by mouth 2 (two) times daily. May take additional tablet daily as needed for breakthrough atrial fib.   folic acid  (FOLVITE ) 1 MG tablet Take 1 tablet (1 mg total) by mouth daily.   HYDROcodone -acetaminophen  (NORCO) 5-325 MG tablet Take 1 tablet by mouth at bedtime.   hydrOXYzine  (ATARAX ) 10 MG tablet Take 1 tablet (10 mg total) by mouth 3 (three) times daily as needed.   lamoTRIgine  (LAMICTAL ) 100 MG tablet Take 1 tablet (100 mg total) by mouth 2 (two) times daily. [facility] [phone])   metoprolol  tartrate (LOPRESSOR ) 25 MG tablet Take 1 tablet (25 mg total) by mouth 2 (two) times daily. May take an additional tablet daily for breakthrough atrial fib (NDC [address]-99)   propranolol  (INDERAL ) 10 MG tablet Take 1 tablet (10 mg total) by mouth up to 4 (four) times daily as needed for breakthrough episodes of atrial fibrillation.   thiamine  (VITAMIN B-1) 100 MG tablet Take 1 tablet (100 mg total) by mouth daily.   tirzepatide  (ZEPBOUND ) 2.5 MG/0.5ML Pen Inject 2.5 mg into the skin once a week.   tirzepatide  (ZEPBOUND ) 5 MG/0.5ML Pen Inject 5 mg into the skin once a week.   traZODone  (DESYREL ) 100 MG tablet Take 1 tablet (100 mg total) by mouth at bedtime as needed.   atorvastatin  (LIPITOR) 40 MG  tablet Take 1 tablet (40 mg total) by mouth daily.   No facility-administered medications prior to visit.    Review of Systems     Objective    BP 126/75 (BP Location: Left Arm, Patient Position: Sitting, Cuff Size: Normal)   Pulse 63   Ht 5' 4 (1.626 m)   Wt 177 lb 6.4 oz (80.5 kg)   SpO2 99%   BMI 30.45 kg/m    Physical Exam Vitals reviewed.  Constitutional:      General: She is not in acute distress.    Appearance: She is well-developed.  HENT:     Head: Normocephalic and atraumatic.  Eyes:     General: No scleral  icterus.    Conjunctiva/sclera: Conjunctivae normal.  Cardiovascular:     Rate and Rhythm: Normal rate.  Pulmonary:     Effort: Pulmonary effort is normal. No respiratory distress.  Skin:    General: Skin is warm and dry.     Findings: No rash.  Neurological:     Mental Status: She is alert and oriented to person, place, and time.  Psychiatric:        Behavior: Behavior normal.      No results found for any visits on 12/01/23.  Assessment & Plan     Problem List Items Addressed This Visit       Cardiovascular and Mediastinum   Atrial fibrillation with RVR (HCC) - Primary   Other Visit Diagnoses       Encounter for completion of form with patient               Atrial Fibrillation (Afib) Intermittent exacerbations likely exacerbated by work-related stress. Managed with Eliquis . Concerns about discontinuing Eliquis  for colonoscopy due to stroke risk. Discussed that many patients temporarily discontinue Eliquis  for colonoscopy without issues. - Continue Eliquis  - Consult cardiologist regarding temporary discontinuation for colonoscopy  Anxiety Significant anxiety related to job stress and application process. Uses Klonopin  for acute episodes. Discussed impact of job application stress on health. - Continue Klonopin  as needed - Provide support and resources for job application process  Bipolar Disorder Experiencing mania with excessive energy and high consumption of diet [facility]. Uses Klonopin  as needed. Concerned about potential discrimination and has not disclosed condition in job applications. - Continue Klonopin  as needed - Monitor and adjust treatment for mania as necessary  General Health Maintenance Due for mammogram. Positive Cologuard test indicating need for colonoscopy. Concerns about Eliquis  cost and stroke risk if discontinued for procedure. Discussed financial assistance options. - Order mammogram - Order colonoscopy         Return for as  scheduled.      Total time spent on today's visit was greater than 30 minutes, including both face-to-face time and nonface-to-face time personally spent on review of chart, answering patient's questions, and coordinating care, form completion.   Jon Eva, MD  [facility] [phone] (phone) [phone] (fax)  [facility]

## 2023-12-02 DIAGNOSIS — Z0279 Encounter for issue of other medical certificate: Secondary | ICD-10-CM

## 2023-12-03 NOTE — Telephone Encounter (Signed)
 Received fax for FMLA from [employer].  Checked with medical records and this is a different for from the accommodations form that was completed at her ov Tuesday.  Placed in Dr. Senaida Lange box

## 2023-12-03 NOTE — Telephone Encounter (Signed)
 Faxed FMLA forms on 12/01/23

## 2023-12-04 NOTE — Telephone Encounter (Signed)
 FMLA form was faxed 12/04/23 @ 1:35 p.m.

## 2023-12-07 ENCOUNTER — Encounter: Payer: Self-pay | Admitting: Cardiovascular Disease

## 2023-12-07 ENCOUNTER — Other Ambulatory Visit: Payer: Self-pay

## 2023-12-07 ENCOUNTER — Other Ambulatory Visit: Payer: Self-pay | Admitting: Family Medicine

## 2023-12-07 NOTE — Telephone Encounter (Signed)
 Error

## 2023-12-08 ENCOUNTER — Other Ambulatory Visit: Payer: Self-pay | Admitting: Family Medicine

## 2023-12-08 ENCOUNTER — Other Ambulatory Visit: Payer: Self-pay

## 2023-12-09 ENCOUNTER — Other Ambulatory Visit: Payer: Self-pay

## 2023-12-10 ENCOUNTER — Other Ambulatory Visit: Payer: Self-pay

## 2023-12-10 ENCOUNTER — Telehealth: Payer: Self-pay | Admitting: Family Medicine

## 2023-12-10 NOTE — Telephone Encounter (Signed)
 Already requested on 12/08/23 by the pharmacy and routed to the provider on 12/10/23.

## 2023-12-10 NOTE — Telephone Encounter (Signed)
 Medication Refill -  Most Recent Primary Care Visit:  Provider: MYRLA JON HERO  Department: [facility]  Visit Type: OFFICE VISIT  Date: 12/01/2023  Medication: traZODone  (DESYREL ) 100 MG tablet [[id number]   Has the patient contacted their pharmacy? Yes (Agent: If no, request that the patient contact the pharmacy for the refill. If patient does not wish to contact the pharmacy document the reason why and proceed with request.) (Agent: If yes, when and what did the pharmacy advise?)  Is this the correct pharmacy for this prescription? Yes If no, delete pharmacy and type the correct one.  This is the patient's preferred pharmacy:  [facility] [address] [place] [address] Phone: [phone] Fax: [phone]    Is the patient out of the medication? Yes  Has the patient been seen for an appointment in the last year OR does the patient have an upcoming appointment? Yes  Can we respond through MyChart? Yes  Agent: Please be advised that Rx refills may take up to 3 business days. We ask that you follow-up with your pharmacy.

## 2023-12-11 ENCOUNTER — Other Ambulatory Visit: Payer: Self-pay

## 2023-12-11 MED FILL — Trazodone HCl Tab 100 MG: ORAL | 90 days supply | Qty: 90 | Fill #0 | Status: AC

## 2023-12-11 NOTE — Telephone Encounter (Signed)
 Requested Prescriptions  Pending Prescriptions Disp Refills   traZODone  (DESYREL ) 100 MG tablet 90 tablet 0    Sig: Take 1 tablet (100 mg total) by mouth at bedtime as needed.     Psychiatry: Antidepressants - Serotonin Modulator Passed - 12/11/2023 11:03 AM      Passed - Valid encounter within last 6 months    Recent Outpatient Visits           1 week ago Atrial fibrillation with RVR [facility])   [place] [facility] [place], Jon HERO, MD   4 months ago Acute right ankle pain   [place] [facility] My[facility]la Jon HERO, MD   6 months ago Obesity (BMI 30-39.9)   [facility] [place], Jon HERO, MD   9 months ago COVID-19   [facility] [place], [place], [place]   10 months ago Class 1 obesity without serious comorbidity with body mass index (BMI) of 32.0 to 32.9 in adult, unspecified obesity type   [facility] [place], Jon HERO, MD       Future Appointments             In 1 week Bacigalupo, Jon HERO, MD [facility], [facility]   In 3 weeks Nahser, Aleene PARAS, MD [facility], [facility]

## 2023-12-15 ENCOUNTER — Ambulatory Visit: Payer: Commercial Managed Care - PPO | Admitting: Cardiovascular Disease

## 2023-12-18 ENCOUNTER — Telehealth: Payer: Self-pay | Admitting: Family Medicine

## 2023-12-18 NOTE — Telephone Encounter (Signed)
[employer] sent in paperwork via fax on 1/7 25 and it has been completed on 12/17/23 and faxed back to the company as of 12/18/23 VM

## 2023-12-24 ENCOUNTER — Encounter: Payer: Commercial Managed Care - PPO | Admitting: Family Medicine

## 2023-12-25 ENCOUNTER — Other Ambulatory Visit: Payer: Self-pay | Admitting: Family Medicine

## 2023-12-25 ENCOUNTER — Other Ambulatory Visit: Payer: Self-pay

## 2023-12-25 MED ORDER — LAMOTRIGINE 100 MG PO TABS
100.0000 mg | ORAL_TABLET | Freq: Two times a day (BID) | ORAL | 0 refills | Status: DC
Start: 2023-12-25 — End: 2024-04-07
  Filled 2023-12-25: qty 60, 30d supply, fill #0
  Filled 2024-01-14 – 2024-01-29 (×2): qty 60, 30d supply, fill #1
  Filled 2024-02-26: qty 60, 30d supply, fill #2

## 2023-12-25 NOTE — Telephone Encounter (Signed)
Requested medication (s) are due for refill today: yes  Requested medication (s) are on the active medication list: yes  Last refill:  09/10/23  Future visit scheduled: yes  Notes to clinic:  Unable to refill per protocol, cannot delegate.      Requested Prescriptions  Pending Prescriptions Disp Refills   clonazePAM (KLONOPIN) 0.5 MG tablet 30 tablet 1    Sig: Take 1 tablet (0.5 mg total) by mouth 2 (two) times daily as needed for anxiety.     Not Delegated - Psychiatry: Anxiolytics/Hypnotics 2 Failed - 12/25/2023 11:57 AM      Failed - This refill cannot be delegated      Failed - Urine Drug Screen completed in last 360 days      Passed - Patient is not pregnant      Passed - Valid encounter within last 6 months    Recent Outpatient Visits           3 weeks ago Atrial fibrillation with RVR [facility])   [place] [facility] [place], Marzella Schlein, MD   4 months ago Acute right ankle pain   [place] [facility] Erasmo Downer, MD   7 months ago Obesity (BMI 30-39.9)   [place] [facility] [place], Marzella Schlein, MD   10 months ago COVID-19   [facility] [place], [place], [place]   10 months ago Class 1 obesity without serious comorbidity with body mass index (BMI) of 32.0 to 32.9 in adult, unspecified obesity type   [facility] [place], Marzella Schlein, MD       Future Appointments             In 1 week Bacigalupo, Marzella Schlein, MD [facility], [facility]   In 1 week Nahser, Deloris Ping, MD [facility] at [facility], [facility]

## 2023-12-25 NOTE — Telephone Encounter (Signed)
Requested medication (s) are due for refill today: yes  Requested medication (s) are on the active medication list: yes  Last refill:  09/10/23  Future visit scheduled: yes  Notes to clinic:  Unable to refill per protocol, cannot delegate.      Requested Prescriptions  Pending Prescriptions Disp Refills   clonazePAM (KLONOPIN) 0.5 MG tablet 30 tablet 1    Sig: Take 1 tablet (0.5 mg total) by mouth 2 (two) times daily as needed for anxiety.     Not Delegated - Psychiatry: Anxiolytics/Hypnotics 2 Failed - 12/25/2023  8:55 AM      Failed - This refill cannot be delegated      Failed - Urine Drug Screen completed in last 360 days      Passed - Patient is not pregnant      Passed - Valid encounter within last 6 months    Recent Outpatient Visits           3 weeks ago Atrial fibrillation with RVR [facility])   [place] [facility] [place], Marzella Schlein, MD   4 months ago Acute right ankle pain   [place] [facility] Erasmo Downer, MD   7 months ago Obesity (BMI 30-39.9)   [place] [place], Marzella Schlein, MD   10 months ago COVID-19   [facility] [place], [place], [place]   10 months ago Class 1 obesity without serious comorbidity with body mass index (BMI) of 32.0 to 32.9 in adult, unspecified obesity type   [facility] [place], Marzella Schlein, MD       Future Appointments             In 1 week Bacigalupo, Marzella Schlein, MD [facility], [facility]   In 1 week Nahser, Deloris Ping, MD [facility] at [facility], [facility]            Signed Prescriptions Disp Refills   lamoTRIgine (LAMICTAL) 100 MG tablet 180 tablet 0    Sig: Take 1 tablet (100 mg total) by mouth 2 (two) times daily. [facility] [phone])     Neurology:  Anticonvulsants - lamotrigine Failed - 12/25/2023  8:55 AM      Failed - Cr in normal range and within 360 days     Creatinine, Ser  Date Value Ref Range Status  09/08/2022 0.81 0.57 - 1.00 mg/dL Final         Failed - ALT in normal range and within 360 days    ALT  Date Value Ref Range Status  08/26/2022 19 0 - 44 U/L Final         Failed - AST in normal range and within 360 days    AST  Date Value Ref Range Status  08/26/2022 23 15 - 41 U/L Final         Passed - Completed PHQ-2 or PHQ-9 in the last 360 days      Passed - Valid encounter within last 12 months    Recent Outpatient Visits           3 weeks ago Atrial fibrillation with RVR [facility])   [place] [facility] [place], Marzella Schlein, MD   4 months ago Acute right ankle pain   [place] [facility] Er[facility]o Down[facility], MD   7 months ago Obesity (BMI 30-39.9)   [facility] [place], Marzella Schlein, MD   10  months ago COVID-19   [facility] [place], [place], [place]   10 months ago Class 1 obesity without serious comorbidity with body mass index (BMI) of 32.0 to 32.9 in adult, unspecified obesity type   [facility] [place], Marzella Schlein, MD       Future Appointments             In 1 week Bacigalupo, Marzella Schlein, MD [facility], [facility]   In 1 week Nahser, Deloris Ping, MD [facility] at [facility], [facility]

## 2023-12-25 NOTE — Telephone Encounter (Signed)
Requested Prescriptions  Pending Prescriptions Disp Refills   clonazePAM (KLONOPIN) 0.5 MG tablet 30 tablet 1    Sig: Take 1 tablet (0.5 mg total) by mouth 2 (two) times daily as needed for anxiety.     Not Delegated - Psychiatry: Anxiolytics/Hypnotics 2 Failed - 12/25/2023  8:54 AM      Failed - This refill cannot be delegated      Failed - Urine Drug Screen completed in last 360 days      Passed - Patient is not pregnant      Passed - Valid encounter within last 6 months    Recent Outpatient Visits           3 weeks ago Atrial fibrillation with RVR [facility])   [place] [facility] [place], Marzella Schlein, MD   4 months ago Acute right ankle pain   [place] [facility]o Downer, MD   7 months ago Obesity (BMI 30-39.9)   [facility] [place], Marzella Schlein, MD   10 months ago COVID-19   [facility] [place], [place], [place]   10 months ago Class 1 obesity without serious comorbidity with body mass index (BMI) of 32.0 to 32.9 in adult, unspecified obesity type   [facility] [place], Marzella Schlein, MD       Future Appointments             In 1 week Bacigalupo, Marzella Schlein, MD [facility], [facility]   In 1 week Nahser, Deloris Ping, MD [facility], [facility]             lamoTRIgine (LAMICTAL) 100 MG tablet 180 tablet 0    Sig: Take 1 tablet (100 mg total) by mouth 2 (two) times daily. [facility] [phone])     Neurology:  Anticonvulsants - lamotrigine Failed - 12/25/2023  8:54 AM      Failed - Cr in normal range and within 360 days    Creatinine, Ser  Date Value Ref Range Status  09/08/2022 0.81 0.57 - 1.00 mg/dL Final         Failed - ALT in normal range and within 360 days    ALT  Date Value Ref Range Status  08/26/2022 19 0 - 44 U/L Final         Failed - AST in normal range and within 360 days    AST  Date  Value Ref Range Status  08/26/2022 23 15 - 41 U/L Final         Passed - Completed PHQ-2 or PHQ-9 in the last 360 days      Passed - Valid encounter within last 12 months    Recent Outpatient Visits           3 weeks ago Atrial fibrillation with RVR [facility])   [place] [facility], Marzella Schlein, MD   4 months ago Acute right ankle pain   [place] [facility] Erasmo Downer, MD   7 months ago Obesity (BMI 30-39.9)   [place] [facility] [place], Marzella Schlein, MD   10 months ago COVID-19   [facility] [place], [place], [place]   10 months ago Class 1 obesity without serious comorbidity with body mass index (BMI) of 32.0 to 32.9 in adult, unspecified obesity type   [facility], Marzella Schlein, MD  Future Appointments             In 1 week Bacigalupo, Marzella Schlein, MD [facility], [facility]   In 1 week Nahser, Deloris Ping, MD [facility] at [facility], [facility]

## 2023-12-27 ENCOUNTER — Other Ambulatory Visit: Payer: Self-pay

## 2023-12-28 ENCOUNTER — Other Ambulatory Visit: Payer: Self-pay

## 2023-12-29 ENCOUNTER — Other Ambulatory Visit: Payer: Self-pay

## 2023-12-29 ENCOUNTER — Telehealth: Payer: Self-pay | Admitting: Family Medicine

## 2023-12-29 MED FILL — Clonazepam Tab 0.5 MG: ORAL | 15 days supply | Qty: 30 | Fill #0 | Status: AC

## 2023-12-29 NOTE — Telephone Encounter (Signed)
Patient called today for this refill, checking on status.

## 2023-12-29 NOTE — Telephone Encounter (Signed)
Pharmacy requested on 12/25/23, routed to the provider on 12/28/23, still pending.

## 2023-12-29 NOTE — Telephone Encounter (Signed)
Copied from CRM [id number]. Topic: General - Other >> Dec 29, 2023  8:43 AM Everette C wrote: Reason for CRM: Medication Refill - Most Recent Primary Care Visit:  Provider: Erasmo Downer Department: [facility] Visit Type: OFFICE VISIT Date: 12/01/2023  Medication: Rx #: [id number]  clonazePAM (KLONOPIN) 0.5 MG tablet [[id number]]    Has the patient contacted their pharmacy? Yes (Agent: If no, request that the patient contact the pharmacy for the refill. If patient does not wish to contact the pharmacy document the reason why and proceed with request.) (Agent: If yes, when and what did the pharmacy advise?)  Is this the correct pharmacy for this prescription? Yes If no, delete pharmacy and type the correct one.  This is the patient's preferred pharmacy:  [facility] [address] [place] [address] Phone: [phone] Fax: [phone]  Has the prescription been filled recently? No  Is the patient out of the medication? No  Has the patient been seen for an appointment in the last year OR does the patient have an upcoming appointment? Yes  Can we respond through MyChart? No  Agent: Please be advised that Rx refills may take up to 3 business days. We ask that you follow-up with your pharmacy.

## 2023-12-30 ENCOUNTER — Other Ambulatory Visit: Payer: Self-pay

## 2024-01-03 ENCOUNTER — Encounter: Payer: Self-pay | Admitting: Cardiovascular Disease

## 2024-01-03 NOTE — Progress Notes (Signed)
 Cardiology Office Note:    Date:  01/05/2024   ID:  Kathleen Perkins, DOB Apr 02, 1961, MRN [id number]  PCP:  Myrla Jon HERO, MD  [facility] Cardiologist:  Wynne Jury  [facility] Electrophysiologist:  None   Referring MD: Myrla Jon HERO, MD   Chief Complaint  Patient presents with   Atrial Fibrillation   Cerebrovascular Accident     Jan. 7, 2022   Kathleen Perkins is a 63 y.o. female with a hx of palpitations.  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  Has had palpitations - was started on Verapamil Then atenolol   Worse with menapause  palps seem to be worse with stress Episodes of tachycardia  - would last perhaps 30 minutes   Exercises intermittantly.  Did well around 2017 for years , not so much this past year Really enjoyed exercising.  Admits that her palpitations   Tries to avoid caffeine,   Has bipolar - tends to be more manic than depressed.   Oct. 9, 2023 Hope seen today for follow up of her mitral regurgitation, hyperlipidemia, and recent diagnosis of atrial fib and a recent stroke   Lipids from 08/23/22 : Chol = 223 HDL = 87 LDL = 113 Trigs = 117  These most recent lipids have improved on Atorvastatin  since her previous labs in Dec. 2021  She was admitted to [facility] ( 9/25, 2023) several weeks ago  with an acute stroke and atrial fib.  Received TNK  Had a severe contrast allergy with the head CT .  Improved significantly   Echo shows normal LV function   Has noticed more palpitations   Had an ER visit on aug. 29, 2023  for chest pain  Her father and Reatha (brother ) had CABG  Brother Sherida is doing well   Occasional episodes of apprehension    Has had paroxysmal atrial fib - has been persistent for the past 2 weeks.    The plan is to continue anticoagulation for the next week Schedule TEE /  cardioversion .  I specifically would like to do a TEE prior to the CV since she is just 2 weeks out from a stroke .  If she converts to sinus rhythm before or after her scheduled cardioversion,  she is to start taking Flecainide  50 mg BID.  Wt is 191 lbs    Feb. 7, 2024 Hope is seen for follow up of her PAF and stroke  ( prior to Afib diagnosis)   She is on Flecainide  Had no QRS widening on her GXT  Wt is   She's upset over her weight.    Had an episode of PAF last night Lasted ~ 1 - 1.5 hours .  She had not taken her meds ( no flecainide  , no metoprolol  ) HR 130-150   She takes an extra propranolol  when she has PAF Occasionally will take an extra flecainide  50 mg for breakthrough PAF  She also takes an extra metoprolol  25 mg for breakthrough PAF    Wants to lose 7 lbs before next    Aug. 8 2024 Hope is seen for foll up of her paroxysmal atrial fibrillation and stroke.  She is now on flecainide . She has a strong family hx of CAD  Wt is 175 ( down 16 lbs )  Was on Wegovy , now on generic Mongero  Takes an additional flecainide  and metoprolol  when she has PAF  Typically needs an extra when she is busy at work   Will check ECG since she is having more palps and taking more flecainide    Has occasional episodes of chest pain  LDL is 113 Takes her atorvastatin  perhaps 3 days a week  Some exercise ,   Feb. 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  Her Flecainide  50 BID seems to be holding her stable  Still has episodes of PAF , multiple times a week .   We briefly discussed A-fib ablation.  She is giving some thought.   Past Medical History:  Diagnosis Date   Anxiety    Cerebrovascular accident (CVA) (HCC) 08/26/2022   Melanoma of upper arm  (HCC)    MVP (mitral valve prolapse)    Primary osteoarthritis of left knee 08/15/2014    Past Surgical History:  Procedure Laterality Date   ARTHROSCOPIC REPAIR ACL     BREAST BIOPSY Left 06/23/2013   u/s bx- neg   BREAST EXCISIONAL BIOPSY Left 07/14/2013   us /bx-neg   MELANOMA EXCISION     left arm.    REPLACEMENT TOTAL KNEE     left     Current Medications: Current Meds  Medication Sig   amoxicillin  (AMOXIL ) 500 MG capsule Take 4 capsules (2,000 mg total) by mouth 1 hour prior to dental appointment   atorvastatin  (LIPITOR) 40 MG tablet Take 1 tablet (40 mg total) by mouth daily.   clonazePAM  (KLONOPIN ) 0.5 MG tablet Take 1 tablet (0.5 mg total) by mouth 2 (two) times daily as needed for anxiety.   digoxin  (LANOXIN ) 0.125 MG tablet Take 1 tablet (0.125 mg total) by mouth daily.   folic acid  (FOLVITE ) 1 MG tablet Take 1 tablet (1 mg total) by mouth daily.   HYDROcodone -acetaminophen  (NORCO) 5-325 MG tablet Take 1 tablet by mouth at bedtime.   hydrOXYzine  (ATARAX ) 10 MG tablet Take 1 tablet (10 mg total) by mouth 3 (three) times daily as needed.   lamoTRIgine  (LAMICTAL ) 100 MG tablet Take 1 tablet (100 mg total) by mouth 2 (two) times daily. (NDC [phone])   propranolol  (INDERAL ) 10 MG tablet Take 1 tablet (10 mg total) by mouth up to 4 (four) times daily as needed for breakthrough episodes of atrial fibrillation.   thiamine  (VITAMIN B-1) 100 MG tablet Take 1 tablet (100 mg total) by mouth daily.   tirzepatide  (ZEPBOUND ) 2.5 MG/0.5ML Pen Inject 2.5 mg into the skin once a week.   tirzepatide  (ZEPBOUND ) 5 MG/0.5ML Pen Inject 5 mg into the skin once a week.   traZODone  (DESYREL ) 100 MG tablet Take 1 tablet (100 mg total) by mouth at bedtime as needed.   [DISCONTINUED] apixaban  (ELIQUIS ) 5 MG TABS tablet Take 1 tablet (5 mg total) by mouth 2 (two) times daily.   [DISCONTINUED] diltiazem  (CARDIZEM  CD) 120 MG 24 hr capsule Take 1 capsule (120 mg total) by mouth daily.    [DISCONTINUED] flecainide  (TAMBOCOR ) 50 MG tablet Take 1 tablet (50 mg total) by mouth 2 (two) times daily. May take additional tablet daily as needed for breakthrough  atrial fib.   [DISCONTINUED] metoprolol  tartrate (LOPRESSOR ) 25 MG tablet Take 1 tablet (25 mg total) by mouth 2 (two) times daily. May take an additional tablet daily for breakthrough atrial fib [facility] [id number])     Allergies:   Ivp dye [iodinated contrast media], Codeine, Erythromycin, Nsaids, and Ibuprofen   Social History   Socioeconomic History   Marital status: Divorced    Spouse name: Not on file   Number of children: Not on file   Years of education: Not on file   Highest education level: Not on file  Occupational History   Not on file  Tobacco Use   Smoking status: Never   Smokeless tobacco: Never  Substance and Sexual Activity   Alcohol use: No   Drug use: No   Sexual activity: Not on file  Other Topics Concern   Not on file  Social History Narrative   Not on file   Social Drivers of Health   Financial Resource Strain: Not on file  Food Insecurity: Not on file  Transportation Needs: Not on file  Physical Activity: Not on file  Stress: Not on file  Social Connections: Not on file     Family History: The patient's family history includes Heart attack in her brother; Heart disease in her brother and father; Hyperlipidemia in her brother and father; Hypertension in her brother and father. There is no history of Breast cancer.  ROS:   Please see the history of present illness.     All other systems reviewed and are negative.  EKGs/Labs/Other Studies Reviewed:    The following studies were reviewed today:        Recent Labs: No results found for requested labs within last 365 days.  Recent Lipid Panel    Component Value Date/Time   CHOL 223 (H) 08/26/2022 0409   CHOL 268 (H) 11/08/2020 1423   TRIG 117 08/26/2022 0409   HDL 87 08/26/2022 0409   HDL 93 11/08/2020 1423   CHOLHDL 2.6  08/26/2022 0409   VLDL 23 08/26/2022 0409   LDLCALC 113 (H) 08/26/2022 0409   LDLCALC 145 (H) 11/08/2020 1423     Risk Assessment/Calculations:       Physical Exam:     Physical Exam: Blood pressure 102/60, pulse 65, height 5' 4 (1.626 m), weight 180 lb (81.6 kg), SpO2 97%.       GEN:  Well nourished, well developed in no acute distress HEENT: Normal NECK: No JVD; No carotid bruits LYMPHATICS: No lymphadenopathy CARDIAC: RRR , no murmurs, rubs, gallops RESPIRATORY:  Clear to auscultation without rales, wheezing or rhonchi  ABDOMEN: Soft, non-tender, non-distended MUSCULOSKELETAL:  No edema; No deformity  SKIN: Warm and dry NEUROLOGIC:  Alert and oriented x 3    ASSESSMENT:    1. Paroxysmal atrial fibrillation (HCC)       PLAN:      Paroxysmal atrial fib:  Continues to have episodes of paroxysmal atrial fibrillation.  We discussed ablation.  She is not ready to commit to that at this point.  Continue flecainide , metoprolol , diltiazem .  Continue Eliquis  5 mg twice a day.     2.  Hyperlipidemia:  Her primary medical doctor is managing her hyperlipidemia.  I will see her again in approximately 5 months on June 27 in the late afternoon.         Medication Adjustments/Labs and Tests Ordered: Current medicines are reviewed at length with the patient today.  Concerns regarding medicines are outlined above.  No  orders of the defined types were placed in this encounter.  Meds ordered this encounter  Medications   apixaban  (ELIQUIS ) 5 MG TABS tablet    Sig: Take 1 tablet (5 mg total) by mouth 2 (two) times daily.    Dispense:  180 tablet    Refill:  3    30 day supply   flecainide  (TAMBOCOR ) 50 MG tablet    Sig: Take 1 tablet (50 mg total) by mouth 2 (two) times daily. May take additional tablet daily as needed for breakthrough atrial fib.    Dispense:  225 tablet    Refill:  3   diltiazem  (CARDIZEM  CD) 120 MG 24 hr capsule    Sig: Take 1 capsule (120  mg total) by mouth daily.    Dispense:  90 capsule    Refill:  3   metoprolol  tartrate (LOPRESSOR ) 25 MG tablet    Sig: Take 1 tablet (25 mg total) by mouth 2 (two) times daily. May take an additional tablet daily for breakthrough atrial fib [facility] [id number])    Dispense:  210 tablet    Refill:  3    Patient Instructions  Medication Instructions:  Refilled cardiac meds *If you need a refill on your cardiac medications before your next appointment, please call your pharmacy*   Follow-Up: At [facility], you and your health needs are our priority.  As part of our continuing mission to provide you with exceptional heart care, we have created designated Provider Care Teams.  These Care Teams include your primary Cardiologist (physician) and Advanced Practice Providers (APPs -  Physician Assistants and Nurse Practitioners) who all work together to provide you with the care you need, when you need it.  We recommend signing up for the patient portal called MyChart.  Sign up information is provided on this After Visit Summary.  MyChart is used to connect with patients for Virtual Visits (Telemedicine).  Patients are able to view lab/test results, encounter notes, upcoming appointments, etc.  Non-urgent messages can be sent to your provider as well.   To learn more about what you can do with MyChart, go to [email or web address].    Your next appointment:   As scheduled  Provider:   Dr. Alveta  Other Instructions   1st Floor: - Lobby - Registration  - Pharmacy  - Lab - Cafe  2nd Floor: - PV Lab - Diagnostic Testing (echo, CT, nuclear med)  3rd Floor: - Vacant  4th Floor: - TCTS (cardiothoracic surgery) - AFib Clinic - Structural Heart Clinic - Vascular Surgery  - Vascular Ultrasound  5th Floor: - HeartCare Cardiology (general and EP) - Clinical Pharmacy for coumadin, hypertension, lipid, weight-loss medications, and med management appointments    Valet  parking services will be available as well.          Signed, Aleene Alveta, MD  01/05/2024 5:40 PM    [facility]

## 2024-01-04 ENCOUNTER — Other Ambulatory Visit: Payer: Self-pay | Admitting: Cardiovascular Disease

## 2024-01-04 ENCOUNTER — Other Ambulatory Visit: Payer: Self-pay

## 2024-01-04 MED FILL — Propranolol HCl Tab 10 MG: ORAL | 30 days supply | Qty: 120 | Fill #3 | Status: AC

## 2024-01-05 ENCOUNTER — Other Ambulatory Visit: Payer: Self-pay

## 2024-01-05 ENCOUNTER — Ambulatory Visit: Payer: Commercial Managed Care - PPO | Attending: Cardiovascular Disease | Admitting: Cardiovascular Disease

## 2024-01-05 ENCOUNTER — Encounter: Payer: Self-pay | Admitting: Family Medicine

## 2024-01-05 ENCOUNTER — Ambulatory Visit (INDEPENDENT_AMBULATORY_CARE_PROVIDER_SITE_OTHER): Payer: Commercial Managed Care - PPO | Admitting: Family Medicine

## 2024-01-05 VITALS — BP 111/75 | HR 68 | Ht 64.0 in | Wt 179.8 lb

## 2024-01-05 DIAGNOSIS — R739 Hyperglycemia, unspecified: Secondary | ICD-10-CM | POA: Diagnosis not present

## 2024-01-05 DIAGNOSIS — E782 Mixed hyperlipidemia: Secondary | ICD-10-CM

## 2024-01-05 DIAGNOSIS — Z0001 Encounter for general adult medical examination with abnormal findings: Secondary | ICD-10-CM

## 2024-01-05 DIAGNOSIS — E559 Vitamin D deficiency, unspecified: Secondary | ICD-10-CM | POA: Diagnosis not present

## 2024-01-05 DIAGNOSIS — F3160 Bipolar disorder, current episode mixed, unspecified: Secondary | ICD-10-CM | POA: Diagnosis not present

## 2024-01-05 DIAGNOSIS — Z1231 Encounter for screening mammogram for malignant neoplasm of breast: Secondary | ICD-10-CM

## 2024-01-05 DIAGNOSIS — R195 Other fecal abnormalities: Secondary | ICD-10-CM

## 2024-01-05 DIAGNOSIS — I480 Paroxysmal atrial fibrillation: Secondary | ICD-10-CM

## 2024-01-05 DIAGNOSIS — E669 Obesity, unspecified: Secondary | ICD-10-CM | POA: Diagnosis not present

## 2024-01-05 DIAGNOSIS — Z79899 Other long term (current) drug therapy: Secondary | ICD-10-CM

## 2024-01-05 DIAGNOSIS — I10 Essential (primary) hypertension: Secondary | ICD-10-CM

## 2024-01-05 DIAGNOSIS — Z1159 Encounter for screening for other viral diseases: Secondary | ICD-10-CM

## 2024-01-05 DIAGNOSIS — Z0000 Encounter for general adult medical examination without abnormal findings: Secondary | ICD-10-CM

## 2024-01-05 MED ORDER — FLECAINIDE ACETATE 50 MG PO TABS
50.0000 mg | ORAL_TABLET | Freq: Two times a day (BID) | ORAL | 3 refills | Status: DC
  Filled 2024-01-05: qty 180, 90d supply, fill #0
  Filled 2024-04-20 – 2024-05-09 (×2): qty 180, 90d supply, fill #1

## 2024-01-05 MED ORDER — METOPROLOL TARTRATE 25 MG PO TABS
25.0000 mg | ORAL_TABLET | Freq: Two times a day (BID) | ORAL | 3 refills | Status: DC
  Filled 2024-01-05: qty 210, 90d supply, fill #0
  Filled 2024-04-20: qty 210, 90d supply, fill #1

## 2024-01-05 MED ORDER — APIXABAN 5 MG PO TABS
5.0000 mg | ORAL_TABLET | Freq: Two times a day (BID) | ORAL | 3 refills | Status: DC
  Filled 2024-01-14: qty 180, 90d supply, fill #0
  Filled 2024-04-12: qty 60, 30d supply, fill #1
  Filled 2024-04-20 – 2024-05-09 (×3): qty 180, 90d supply, fill #1
  Filled 2024-08-18: qty 180, 90d supply, fill #2
  Filled 2024-09-01: qty 60, 30d supply, fill #2
  Filled 2024-09-23 – 2024-09-24 (×3): qty 60, 30d supply, fill #3
  Filled 2024-10-21: qty 60, 30d supply, fill #4
  Filled 2024-11-18: qty 60, 30d supply, fill #5
  Filled 2024-12-13 – 2024-12-16 (×2): qty 60, 30d supply, fill #6

## 2024-01-05 MED ORDER — DILTIAZEM HCL ER COATED BEADS 120 MG PO CP24
120.0000 mg | ORAL_CAPSULE | Freq: Every day | ORAL | 3 refills | Status: DC
  Filled 2024-01-05 – 2024-01-29 (×3): qty 90, 90d supply, fill #0
  Filled 2024-05-09: qty 90, 90d supply, fill #1

## 2024-01-05 NOTE — Progress Notes (Signed)
 Complete physical exam   Patient: Kathleen Perkins   DOB: Nov 30, 1961   63 y.o. Female  MRN: [id number] Visit Date: 01/05/2024  Today's healthcare provider: Jon Eva, MD   Chief Complaint  Patient presents with   Annual Exam    Last completed 12/12/22 Diet -  General, healthy and well balanced Exercise - not currently Feeling - well Sleeping - well Concerns - would like to extend FMLA until March 7th. Will need a note faxed to [employer]. Fax #[phone] Attn: Ronal Naegeli   Care Management    Mammogram - ok to order  Cervical Cancer Screening-  declined Hepatitis C Screening - yes    Immunizations    Pneumonia - declined Shingles - declined   Subjective    Kathleen Perkins is a 63 y.o. female who presents today for a complete physical exam.   Discussed the use of AI scribe software for clinical note transcription with the patient, who gave verbal consent to proceed.  History of Present Illness   The patient, with a history of bipolar disorder and a broken ankle, presents for a routine check-up. The patient reports being currently unemployed and is actively seeking a job. The patient has been managing her bipolar disorder with Lamictal  and reports no current issues. The patient's broken ankle has healed well and is not causing any discomfort. The patient has been sedentary and has gained weight. The patient is not currently sexually active and has not had a Pap smear since 2018, but has agreed to have one next year. The patient has agreed to have a mammogram and colonoscopy. The patient has not been taking Lipitor.        Last depression screening scores    12/01/2023   10:08 AM 05/28/2023    4:01 PM 02/02/2023    2:47 PM  PHQ 2/9 Scores  PHQ - 2 Score 0 0 0  PHQ- 9 Score 0 0 0   Last fall risk screening    12/01/2023   10:08 AM  Fall Risk   Falls in the past year? 0  Number falls in past yr: 0  Injury with Fall? 0  Risk for fall due to : No Fall Risks   Follow up Falls evaluation completed        Medications: Outpatient Medications Prior to Visit  Medication Sig   amoxicillin  (AMOXIL ) 500 MG capsule Take 4 capsules (2,000 mg total) by mouth 1 hour prior to dental appointment   atorvastatin  (LIPITOR) 40 MG tablet Take 1 tablet (40 mg total) by mouth daily.   clonazePAM  (KLONOPIN ) 0.5 MG tablet Take 1 tablet (0.5 mg total) by mouth 2 (two) times daily as needed for anxiety.   digoxin  (LANOXIN ) 0.125 MG tablet Take 1 tablet (0.125 mg total) by mouth daily.   folic acid  (FOLVITE ) 1 MG tablet Take 1 tablet (1 mg total) by mouth daily.   HYDROcodone -acetaminophen  (NORCO) 5-325 MG tablet Take 1 tablet by mouth at bedtime.   hydrOXYzine  (ATARAX ) 10 MG tablet Take 1 tablet (10 mg total) by mouth 3 (three) times daily as needed.   lamoTRIgine  (LAMICTAL ) 100 MG tablet Take 1 tablet (100 mg total) by mouth 2 (two) times daily. (NDC [phone])   propranolol  (INDERAL ) 10 MG tablet Take 1 tablet (10 mg total) by mouth up to 4 (four) times daily as needed for breakthrough episodes of atrial fibrillation.   thiamine  (VITAMIN B-1) 100 MG tablet Take 1 tablet (100 mg total) by mouth  daily.   tirzepatide  (ZEPBOUND ) 2.5 MG/0.5ML Pen Inject 2.5 mg into the skin once a week.   tirzepatide  (ZEPBOUND ) 5 MG/0.5ML Pen Inject 5 mg into the skin once a week.   traZODone  (DESYREL ) 100 MG tablet Take 1 tablet (100 mg total) by mouth at bedtime as needed.   [DISCONTINUED] apixaban  (ELIQUIS ) 5 MG TABS tablet Take 1 tablet (5 mg total) by mouth 2 (two) times daily.   [DISCONTINUED] diltiazem  (CARDIZEM  CD) 120 MG 24 hr capsule Take 1 capsule (120 mg total) by mouth daily.   [DISCONTINUED] flecainide  (TAMBOCOR ) 50 MG tablet Take 1 tablet (50 mg total) by mouth 2 (two) times daily. May take additional tablet daily as needed for breakthrough atrial fib.   [DISCONTINUED] metoprolol  tartrate (LOPRESSOR ) 25 MG tablet Take 1 tablet (25 mg total) by mouth 2 (two) times daily.  May take an additional tablet daily for breakthrough atrial fib [facility] [id number])   No facility-administered medications prior to visit.    Review of Systems    Objective    BP 111/75 (BP Location: Left Arm, Patient Position: Sitting, Cuff Size: Normal)   Pulse 68   Ht 5' 4 (1.626 m)   Wt 179 lb 12.8 oz (81.6 kg)   SpO2 98%   BMI 30.86 kg/m    Physical Exam Vitals reviewed.  Constitutional:      General: She is not in acute distress.    Appearance: Normal appearance. She is well-developed. She is not diaphoretic.  HENT:     Head: Normocephalic and atraumatic.     Right Ear: Tympanic membrane, ear canal and external ear normal.     Left Ear: Tympanic membrane, ear canal and external ear normal.     Nose: Nose normal.     Mouth/Throat:     Mouth: Mucous membranes are moist.     Pharynx: Oropharynx is clear. No oropharyngeal exudate.  Eyes:     General: No scleral icterus.    Conjunctiva/sclera: Conjunctivae normal.     Pupils: Pupils are equal, round, and reactive to light.  Neck:     Thyroid: No thyromegaly.  Cardiovascular:     Rate and Rhythm: Normal rate and regular rhythm.     Heart sounds: Normal heart sounds. No murmur heard. Pulmonary:     Effort: Pulmonary effort is normal. No respiratory distress.     Breath sounds: Normal breath sounds. No wheezing or rales.  Abdominal:     General: There is no distension.     Palpations: Abdomen is soft.     Tenderness: There is no abdominal tenderness.  Musculoskeletal:        General: No deformity.     Cervical back: Neck supple.     Right lower leg: Edema present.     Left lower leg: Edema present.  Lymphadenopathy:     Cervical: No cervical adenopathy.  Skin:    General: Skin is warm and dry.     Findings: No rash.  Neurological:     Mental Status: She is alert and oriented to person, place, and time. Mental status is at baseline.     Gait: Gait normal.  Psychiatric:        Mood and Affect: Mood normal.         Behavior: Behavior normal.        Thought Content: Thought content normal.      No results found for any visits on 01/05/24.  Assessment & Plan    Routine Health Maintenance and  Physical Exam  Exercise Activities and Dietary recommendations  Goals   None     Immunization History  Administered Date(s) Administered   DTaP 05/06/1966   Diphtheria 04/26/1969, 04/05/1970   Influenza,inj,Quad PF,6+ Mos 12/06/2020   Influenza-Unspecified 10/23/2021, 08/28/2022   MMR 05/13/2013, 07/01/2013   Measles 03/12/1971   Mumps 07/22/1972   OPV 01/30/1963, 03/27/1963, 04/26/1969, 04/30/1971, 06/06/1975   PFIZER(Purple Top)SARS-COV-2 Vaccination 11/01/2020, 11/22/2020   PPD Test 05/08/2020, 02/25/2022, 03/17/2022   Rubella 03/12/1971   Tdap 05/05/2013, 02/17/2020   Tetanus 04/26/1969, 04/05/1970    Health Maintenance  Topic Date Due   Pneumococcal Vaccine 26-60 Years old (1 of 2 - PCV) Never done   Hepatitis C Screening  Never done   Zoster Vaccines- Shingrix  (1 of 2) Never done   MAMMOGRAM  11/26/2019   COVID-19 Vaccine (3 - Pfizer risk series) 12/20/2020   Cervical Cancer Screening (HPV/Pap Cotest)  10/28/2022   INFLUENZA VACCINE  02/29/2024 (Originally 07/02/2023)   Fecal DNA (Cologuard)  01/28/2026   DTaP/Tdap/Td (6 - Td or Tdap) 02/16/2030   HIV Screening  Completed   HPV VACCINES  Aged Out    Discussed health benefits of physical activity, and encouraged her to engage in regular exercise appropriate for her age and condition.  Problem List Items Addressed This Visit       Cardiovascular and Mediastinum   Paroxysmal atrial fibrillation (HCC)   Relevant Orders   CBC w/Diff/Platelet   Hypertension   Relevant Orders   Comprehensive metabolic panel     Other   Hyperlipidemia   Relevant Orders   Comprehensive metabolic panel   Lipid panel   Avitaminosis D   Relevant Orders   VITAMIN D  25 Hydroxy (Vit-D Deficiency, Fractures)   Obesity (BMI 30-39.9)   Bipolar 1  disorder, mixed (HCC)   Other Visit Diagnoses       Encounter for annual physical exam    -  Primary   Relevant Orders   Hemoglobin A1c   Comprehensive metabolic panel   Lipid panel   CBC w/Diff/Platelet   Hepatitis C Antibody   VITAMIN D  25 Hydroxy (Vit-D Deficiency, Fractures)     Long-term use of high-risk medication       Relevant Orders   CBC w/Diff/Platelet     Hyperglycemia       Relevant Orders   Hemoglobin A1c     Need for hepatitis C screening test       Relevant Orders   Hepatitis C Antibody     Breast cancer screening by mammogram       Relevant Orders   MM 3D SCREENING MAMMOGRAM BILATERAL BREAST     Positive colorectal cancer screening using Cologuard test       Relevant Orders   Ambulatory referral to Gastroenterology           Bipolar I Disorder Bipolar I disorder, well-managed on Lamictal  100 mg BID. She reports no current psychiatric follow-up and is reluctant to try new medications or see psychiatry due to past experiences and concerns about medication side effects. Discussed the risk of SSRIs potentially inducing mania in bipolar I disorder and her preference to avoid them. - Continue Lamictal  200 mg BID  Job-related Stress and Emotional Well-being Significant stress related to job search and employment status, including issues with FMLA and insurance. She expresses emotional distress but is managing without psychiatric crisis. Discussed her goals of maintaining insurance coverage and finding suitable employment. 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 Plans to resume medication before getting labs done. Discussed the importance of medication adherence for managing hyperlipidemia. - Encourage resumption of Lipitor - Provide lab slip for fasting lipid panel  General Health Maintenance Due for  several routine health screenings and vaccinations. Last mammogram in 2018, positive Cologuard test necessitates colonoscopy, due for Pap smear, declined shingles and pneumonia vaccinations. Discussed the importance of these screenings and her plan to complete them before March 7th. - Order mammogram - Refer to [facility] for colonoscopy - Encourage scheduling a Pap smear - declines today - Order routine labs including kidney function, liver function, A1c, vitamin D , blood counts, and hep C screening - Provide lab slip for fasting labs        Return in about 6 months (around 07/04/2024) for chronic disease f/u.     Jon Eva, MD  [facility] [phone] (phone) [phone] (fax)  [facility]

## 2024-01-05 NOTE — Patient Instructions (Signed)
 Medication Instructions:  Refilled cardiac meds *If you need a refill on your cardiac medications before your next appointment, please call your pharmacy*   Follow-Up: At [facility], you and your health needs are our priority.  As part of our continuing mission to provide you with exceptional heart care, we have created designated Provider Care Teams.  These Care Teams include your primary Cardiologist (physician) and Advanced Practice Providers (APPs -  Physician Assistants and Nurse Practitioners) who all work together to provide you with the care you need, when you need it.  We recommend signing up for the patient portal called MyChart.  Sign up information is provided on this After Visit Summary.  MyChart is used to connect with patients for Virtual Visits (Telemedicine).  Patients are able to view lab/test results, encounter notes, upcoming appointments, etc.  Non-urgent messages can be sent to your provider as well.   To learn more about what you can do with MyChart, go to [email or web address].    Your next appointment:   As scheduled  Provider:   Dr. Alveta  Other Instructions   1st Floor: - Lobby - Registration  - Pharmacy  - Lab - Cafe  2nd Floor: - PV Lab - Diagnostic Testing (echo, CT, nuclear med)  3rd Floor: - Vacant  4th Floor: - TCTS (cardiothoracic surgery) - AFib Clinic - Structural Heart Clinic - Vascular Surgery  - Vascular Ultrasound  5th Floor: - HeartCare Cardiology (general and EP) - Clinical Pharmacy for coumadin, hypertension, lipid, weight-loss medications, and med management appointments    Valet parking services will be available as well.

## 2024-01-06 ENCOUNTER — Other Ambulatory Visit: Payer: Self-pay

## 2024-01-06 ENCOUNTER — Telehealth: Payer: Self-pay

## 2024-01-06 NOTE — Telephone Encounter (Signed)
 Copied from CRM [id number]. Topic: General - Other >> Jan 06, 2024 10:58 AM Rosaria BRAVO wrote: Reason for CRM: Pt is asking for her PCP to discuss how to access [employer] website computer from home she says they discussed this yesterday but she cannot remember the steps. She is asking for someone to call her back with those steps.   Best contact: [phone]

## 2024-01-07 NOTE — Telephone Encounter (Signed)
 Not really a clinical question, but I use incognito mode and [employer] employee and go from there.

## 2024-01-14 ENCOUNTER — Other Ambulatory Visit: Payer: Self-pay

## 2024-01-18 ENCOUNTER — Other Ambulatory Visit: Payer: Self-pay | Admitting: Family Medicine

## 2024-01-18 ENCOUNTER — Other Ambulatory Visit: Payer: Self-pay

## 2024-01-18 ENCOUNTER — Telehealth: Payer: Self-pay

## 2024-01-18 ENCOUNTER — Encounter: Payer: Self-pay | Admitting: *Deleted

## 2024-01-18 NOTE — Telephone Encounter (Signed)
 Copied from CRM [id number]. Topic: General - Other >> Jan 18, 2024 12:51 PM Dondra Prader E wrote: Reason for CRM: Please email recent FMLA extension letter to the email listed below.   [email or web address]

## 2024-01-18 NOTE — Telephone Encounter (Signed)
 Copied from CRM [id number]. Topic: Referral - Status >> Jan 18, 2024 12:56 PM Dondra Prader E wrote: Reason for CRM: Pt wants her PCP to know that she is just now going to schedule her colonoscopy because she never heard from them until now.

## 2024-01-18 NOTE — Telephone Encounter (Signed)
 Noted

## 2024-01-19 ENCOUNTER — Other Ambulatory Visit: Payer: Self-pay

## 2024-01-19 NOTE — Telephone Encounter (Signed)
 Requested Prescriptions  Refused Prescriptions Disp Refills   traZODone (DESYREL) 100 MG tablet 90 tablet 0    Sig: Take 1 tablet (100 mg total) by mouth at bedtime as needed.     Psychiatry: Antidepressants - Serotonin Modulator Passed - 01/19/2024  2:39 PM      Passed - Valid encounter within last 6 months    Recent Outpatient Visits           1 month ago Atrial fibrillation with RVR [facility])   [place] [facility], Marzella Schlein, MD   5 months ago Acute right ankle pain   [place] [facility] Erasmo Downer, MD   7 months ago Obesity (BMI 30-39.9)   [place] [facility], Marzella Schlein, MD   11 months ago COVID-19   [facility] [place], [place], [place]   11 months ago Class 1 obesity without serious comorbidity with body mass index (BMI) of 32.0 to 32.9 in adult, unspecified obesity type   [facility] [place], Marzella Schlein, MD       Future Appointments             In 4 months Nahser, Deloris Ping, MD [facility], [facility]   In 5 months Bacigalupo, Marzella Schlein, MD [facility], [facility]

## 2024-01-29 ENCOUNTER — Other Ambulatory Visit: Payer: Self-pay

## 2024-01-29 ENCOUNTER — Other Ambulatory Visit: Payer: Self-pay | Admitting: Family Medicine

## 2024-01-29 MED FILL — Clonazepam Tab 0.5 MG: ORAL | 15 days supply | Qty: 30 | Fill #1 | Status: AC

## 2024-01-29 MED FILL — Propranolol HCl Tab 10 MG: ORAL | 30 days supply | Qty: 120 | Fill #4 | Status: AC

## 2024-01-29 MED FILL — Propranolol HCl Tab 10 MG: ORAL | 30 days supply | Qty: 120 | Fill #4 | Status: CN

## 2024-01-29 MED FILL — Clonazepam Tab 0.5 MG: ORAL | 15 days supply | Qty: 30 | Fill #1 | Status: CN

## 2024-01-29 NOTE — Telephone Encounter (Signed)
 Copied from CRM [id number]. Topic: Clinical - Medication Refill >> Jan 29, 2024 10:35 AM Philippa Chester F wrote: Most Recent Primary Care Visit:  Provider: Erasmo Downer  Department: [id number]  Visit Type: OFFICE VISIT  Date: 12/01/2023  Medication: traZODone (DESYREL) 100 MG tablet  Patient stated that she is separating from her employer and would like her trazadone filled for just two months which would be a total of 60 tablets versus her typical 90 day prescription. She is asking that the prescription be placed so that it will be covered by her insurance before she parts way with her employer. The patient states she only needs the 60 day supply as she has 1 month supply left. Patient would like a callback once a decision has been made as she doesn't use MyChart.  Has the patient contacted their pharmacy? Yes (Agent: If no, request that the patient contact the pharmacy for the refill. If patient does not wish to contact the pharmacy document the reason why and proceed with request.) (Agent: If yes, when and what did the pharmacy advise?)  Is this the correct pharmacy for this prescription? Yes If no, delete pharmacy and type the correct one.  This is the patient's preferred pharmacy:  [facility] [address] [place] [address] Phone: [phone] Fax: [phone]   Has the prescription been filled recently? No  Is the patient out of the medication? No, Patient has a 30 day supply currently  Has the patient been seen for an appointment in the last year OR does the patient have an upcoming appointment? Yes  Can we respond through MyChart? No  Agent: Please be advised that Rx refills may take up to 3 business days. We ask that you follow-up with your pharmacy.

## 2024-02-01 ENCOUNTER — Other Ambulatory Visit: Payer: Self-pay

## 2024-02-01 ENCOUNTER — Telehealth: Payer: Self-pay

## 2024-02-01 DIAGNOSIS — Z111 Encounter for screening for respiratory tuberculosis: Secondary | ICD-10-CM

## 2024-02-01 NOTE — Telephone Encounter (Signed)
 There are active orders - can tell her to come in at her convenience and can print lab slip

## 2024-02-01 NOTE — Addendum Note (Signed)
 Addended by: Erasmo Downer on: 02/01/2024 02:50 PM   Modules accepted: Orders

## 2024-02-01 NOTE — Telephone Encounter (Signed)
 Copied from CRM [id number]. Topic: General - Call Back - No Documentation >> Feb 01, 2024 11:30 AM Kathleen Perkins wrote: Reason for CRM: patient needs lab work done and she said her insurance ends 03/07 if she could get it before then please call pt back to get her scheduled [phone]

## 2024-02-01 NOTE — Telephone Encounter (Signed)
 Pt states that she is starting a new job and also needs the quantiferon gold blood test.  Please advise.

## 2024-02-01 NOTE — Telephone Encounter (Signed)
 Pt advised.

## 2024-02-01 NOTE — Telephone Encounter (Signed)
 Requested medication (s) are due for refill today -no  Requested medication (s) are on the active medication list -yes  Future visit scheduled -yes  Last refill: 12/11/23 #90  Notes to clinic: Pt requesting early refill: Patient stated that she is separating from her employer and would like her trazadone filled for just two months which would be a total of 60 tablets versus her typical 90 day prescription. She is asking that the prescription be placed so that it will be covered by her insurance before she parts way with her employer. The patient states she only needs the 60 day supply as she has 1 month supply left. Patient would like a callback once a decision has been made as she doesn't use MyChart.   Requested Prescriptions  Pending Prescriptions Disp Refills   traZODone (DESYREL) 100 MG tablet 90 tablet 0    Sig: Take 1 tablet (100 mg total) by mouth at bedtime as needed.     Psychiatry: Antidepressants - Serotonin Modulator Passed - 02/01/2024 11:24 AM      Passed - Valid encounter within last 6 months    Recent Outpatient Visits           2 months ago Atrial fibrillation with R[facility])   [facility], Marzella Schlein, MD   6 months ago Acute right ankle pain   [place] [facility], MD   8 months ago Obesity (BMI 30-39.9)   [place] [facility] [place], Marzella Schlein, MD   11 months ago COVID-19   [facility] [place], [place], [place]   12 months ago Class 1 obesity without serious comorbidity with body mass index (BMI) of 32.0 to 32.9 in adult, unspecified obesity type   [facility] [place], Marzella Schlein, MD       Future Appointments             In 3 months Nahser, Deloris Ping, MD [facility], [facility]   In 5 months Bacigalupo, Marzella Schlein, MD [facility], [facility]               Requested  Prescriptions  Pending Prescriptions Disp Refills   traZODone (DESYREL) 100 MG tablet 90 tablet 0    Sig: Take 1 tablet (100 mg total) by mouth at bedtime as needed.     Psychiatry: Antidepressants - Serotonin Modulator Passed - 02/01/2024 11:24 AM      Passed - Valid encounter within last 6 months    Recent Outpatient Visits           2 months ago Atrial fibrillation with RVR [facility])   [facility] [place], Marzella Schlein, MD   6 months ago Acute right ankle pain   [place] [facility] Down[facility], MD   8 months ago Obesity (BMI 30-39.9)   [place] [facility] [place], Marzella Schlein, MD   11 months ago COVID-19   [place] [place], [place], [place]   12 months ago Class 1 obesity without serious comorbidity with body mass index (BMI) of 32.0 to 32.9 in adult, unspecified obesity type   [facility] [place], Marzella Schlein, MD       Future Appointments             In 3 months Nahser, Deloris Ping, MD [facility], [facility]  In 5 months Bacigalupo, Marzella Schlein, MD [facility]

## 2024-02-17 ENCOUNTER — Other Ambulatory Visit: Payer: Self-pay | Admitting: Family Medicine

## 2024-02-17 ENCOUNTER — Other Ambulatory Visit: Payer: Self-pay

## 2024-02-18 ENCOUNTER — Other Ambulatory Visit: Payer: Self-pay

## 2024-02-18 MED FILL — Trazodone HCl Tab 100 MG: ORAL | 90 days supply | Qty: 90 | Fill #0 | Status: AC

## 2024-02-18 NOTE — Telephone Encounter (Signed)
 Requested Prescriptions  Pending Prescriptions Disp Refills   traZODone (DESYREL) 100 MG tablet 90 tablet 1    Sig: Take 1 tablet (100 mg total) by mouth at bedtime as needed.     Psychiatry: Antidepressants - Serotonin Modulator Passed - 02/18/2024  3:47 PM      Passed - Valid encounter within last 6 months    Recent Outpatient Visits           2 months ago Atrial fibrillation with RVR [facility])   [place] [facility] [place], Marzella Schlein, MD   6 months ago Acute right ankle pain   [place] [facility] Erasmo Downer, MD   8 months ago Obesity (BMI 30-39.9)   [place] [facility], Marzella Schlein, MD   12 months ago COVID-19   [facility] [place], [place], [place]   1 year ago Class 1 obesity without serious comorbidity with body mass index (BMI) of 32.0 to 32.9 in adult, unspecified obesity type   [facility] [place], Marzella Schlein, MD       Future Appointments             In 3 months Nahser, Deloris Ping, MD [place] at [facility], [facility]   In 4 months Bacigalupo, Marzella Schlein, MD [facility], [facility]

## 2024-02-22 ENCOUNTER — Other Ambulatory Visit: Payer: Self-pay

## 2024-02-23 ENCOUNTER — Other Ambulatory Visit: Payer: Self-pay

## 2024-02-23 DIAGNOSIS — Z111 Encounter for screening for respiratory tuberculosis: Secondary | ICD-10-CM

## 2024-02-23 NOTE — Progress Notes (Signed)
 Marland Kitchen

## 2024-02-25 ENCOUNTER — Telehealth: Payer: Self-pay

## 2024-02-25 ENCOUNTER — Other Ambulatory Visit: Payer: Self-pay

## 2024-02-25 NOTE — Telephone Encounter (Signed)
 Patient is aware results not in.

## 2024-02-25 NOTE — Telephone Encounter (Signed)
 Copied from CRM [id number]. Topic: Clinical - Lab/Test Results >> Feb 25, 2024  8:48 AM Higinio Roger wrote: Reason for CRM: Patient states her TB lab results must be sent to her job today, 02/25/24 via fax or patient will not be employed  Fax: [phone] Agency: [employer] Phone: [phone]  Patient callback #: [phone]

## 2024-02-26 ENCOUNTER — Other Ambulatory Visit: Payer: Self-pay

## 2024-02-26 ENCOUNTER — Other Ambulatory Visit: Payer: Self-pay | Admitting: Family Medicine

## 2024-02-26 ENCOUNTER — Telehealth: Payer: Self-pay

## 2024-02-26 LAB — QUANTIFERON-TB GOLD PLUS
QuantiFERON Nil Value: 0.08 [IU]/mL
QuantiFERON TB1 Ag Value: 0.08 [IU]/mL
QuantiFERON TB2 Ag Value: 0.08 [IU]/mL

## 2024-02-26 MED FILL — Propranolol HCl Tab 10 MG: ORAL | 30 days supply | Qty: 120 | Fill #5 | Status: AC

## 2024-02-26 NOTE — Telephone Encounter (Signed)
 Patient called to see if another provider could read her results as she needs them today for her employment. Spoke with [employer] and they will route to another provider

## 2024-02-26 NOTE — Telephone Encounter (Signed)
 Covering provider Please see the message below and advise

## 2024-02-26 NOTE — Telephone Encounter (Signed)
 Notified pt that Dr. Sherrie Mustache signed off on the faxed copy from [facility] and it has been faxed per her request and a copy left at the front desk for her to pick up.

## 2024-02-26 NOTE — Telephone Encounter (Signed)
 TB test is negative

## 2024-02-26 NOTE — Telephone Encounter (Signed)
 Called and was able to inform the pt of her lab results per Dr Sherrie Mustache, She verbally stated she understood

## 2024-02-26 NOTE — Telephone Encounter (Signed)
 Copied from CRM [id number]. Topic: Clinical - Lab/Test Results >> Feb 26, 2024  9:03 AM Turkey B wrote: Reason for CRM: pt called in for lab results , I let her know that the notes aren't in yet. Pt was still adamant about speaking with office, I tried to connect over but no answer. Please cb when lab notes are in, pt is requesting a copy be sent to job

## 2024-02-29 ENCOUNTER — Other Ambulatory Visit: Payer: Self-pay | Admitting: Family Medicine

## 2024-02-29 ENCOUNTER — Other Ambulatory Visit: Payer: Self-pay

## 2024-02-29 ENCOUNTER — Encounter: Payer: Self-pay | Admitting: Family Medicine

## 2024-03-01 ENCOUNTER — Other Ambulatory Visit: Payer: Self-pay

## 2024-03-02 ENCOUNTER — Other Ambulatory Visit: Payer: Self-pay

## 2024-03-02 NOTE — Telephone Encounter (Signed)
 Requested medication (s) are due for refill today: yes  Requested medication (s) are on the active medication list: yes  Last refill:  12/29/23 #30 1 RF  Future visit scheduled: yes  Notes to clinic:  med not delegated to NT to RF   Requested Prescriptions  Pending Prescriptions Disp Refills   clonazePAM (KLONOPIN) 0.5 MG tablet 30 tablet 1    Sig: Take 1 tablet (0.5 mg total) by mouth 2 (two) times daily as needed for anxiety.     Not Delegated - Psychiatry: Anxiolytics/Hypnotics 2 Failed - 03/02/2024  9:14 AM      Failed - This refill cannot be delegated      Failed - Urine Drug Screen completed in last 360 days      Passed - Patient is not pregnant      Passed - Valid encounter within last 6 months    Recent Outpatient Visits           1 month ago Encounter for annual physical exam   [place] [facility] [place], Marzella Schlein, MD       Future Appointments             In 2 months Nahser, Deloris Ping, MD [facility], [facility]   In 4 months Bacigalupo, Marzella Schlein, MD [facility], [facility]

## 2024-03-03 ENCOUNTER — Other Ambulatory Visit: Payer: Self-pay

## 2024-03-03 MED FILL — Clonazepam Tab 0.5 MG: ORAL | 15 days supply | Qty: 30 | Fill #0 | Status: AC

## 2024-03-08 ENCOUNTER — Other Ambulatory Visit: Payer: Self-pay

## 2024-03-28 ENCOUNTER — Other Ambulatory Visit: Payer: Self-pay

## 2024-04-06 ENCOUNTER — Other Ambulatory Visit: Payer: Self-pay | Admitting: Family Medicine

## 2024-04-06 ENCOUNTER — Other Ambulatory Visit: Payer: Self-pay

## 2024-04-07 ENCOUNTER — Other Ambulatory Visit: Payer: Self-pay

## 2024-04-07 ENCOUNTER — Other Ambulatory Visit: Payer: Self-pay | Admitting: Family Medicine

## 2024-04-07 MED FILL — Lamotrigine Tab 100 MG: ORAL | 90 days supply | Qty: 180 | Fill #0 | Status: CN

## 2024-04-08 ENCOUNTER — Other Ambulatory Visit: Payer: Self-pay

## 2024-04-11 ENCOUNTER — Other Ambulatory Visit: Payer: Self-pay

## 2024-04-12 ENCOUNTER — Other Ambulatory Visit: Payer: Self-pay

## 2024-04-20 ENCOUNTER — Telehealth: Payer: Self-pay | Admitting: Cardiovascular Disease

## 2024-04-20 ENCOUNTER — Other Ambulatory Visit: Payer: Self-pay

## 2024-04-20 ENCOUNTER — Other Ambulatory Visit (HOSPITAL_BASED_OUTPATIENT_CLINIC_OR_DEPARTMENT_OTHER): Payer: Self-pay

## 2024-04-20 MED FILL — Trazodone HCl Tab 100 MG: ORAL | 90 days supply | Qty: 90 | Fill #1 | Status: AC

## 2024-04-20 NOTE — Telephone Encounter (Signed)
 Spoke with patient and she is requesting samples. States she will not have insurance until June 8th. Requesting a 3 week supply

## 2024-04-20 NOTE — Telephone Encounter (Signed)
 Patient calling the office for samples of medication:   1.  What medication and dosage are you requesting samples for? apixaban  (ELIQUIS ) 5 MG TABS tablet    2.  Are you currently out of this medication?   No, but patient says she is almost out.

## 2024-04-26 ENCOUNTER — Other Ambulatory Visit: Payer: Self-pay

## 2024-04-26 MED ORDER — APIXABAN 5 MG PO TABS: 5.0000 mg | ORAL_TABLET | Freq: Two times a day (BID) | ORAL | 0 refills | Status: DC

## 2024-04-27 ENCOUNTER — Other Ambulatory Visit: Payer: Self-pay

## 2024-04-28 ENCOUNTER — Other Ambulatory Visit: Payer: Self-pay

## 2024-04-28 MED FILL — Lamotrigine Tab 100 MG: ORAL | 90 days supply | Qty: 180 | Fill #0 | Status: AC

## 2024-05-09 ENCOUNTER — Other Ambulatory Visit: Payer: Self-pay

## 2024-05-11 ENCOUNTER — Other Ambulatory Visit: Payer: Self-pay | Admitting: Family Medicine

## 2024-05-11 ENCOUNTER — Other Ambulatory Visit: Payer: Self-pay

## 2024-05-11 MED ORDER — HYDROXYZINE HCL 10 MG PO TABS
10.0000 mg | ORAL_TABLET | Freq: Three times a day (TID) | ORAL | 3 refills | Status: DC | PRN
  Filled 2024-05-11 – 2024-05-30 (×2): qty 30, 10d supply, fill #0
  Filled 2024-07-18: qty 30, 10d supply, fill #1
  Filled 2024-08-18: qty 30, 10d supply, fill #2
  Filled 2024-09-01: qty 30, 10d supply, fill #3

## 2024-05-26 ENCOUNTER — Other Ambulatory Visit: Payer: Self-pay

## 2024-05-26 ENCOUNTER — Other Ambulatory Visit: Payer: Self-pay | Admitting: Family Medicine

## 2024-05-26 ENCOUNTER — Telehealth: Payer: Self-pay | Admitting: Cardiovascular Disease

## 2024-05-26 ENCOUNTER — Telehealth: Payer: Self-pay

## 2024-05-26 MED ORDER — PROPRANOLOL HCL 10 MG PO TABS
10.0000 mg | ORAL_TABLET | Freq: Four times a day (QID) | ORAL | 5 refills | Status: AC | PRN
  Filled 2024-05-26: qty 120, 30d supply, fill #0
  Filled 2024-08-15 – 2024-09-01 (×2): qty 120, 30d supply, fill #1
  Filled 2024-10-05: qty 120, 30d supply, fill #2

## 2024-05-26 MED ORDER — APIXABAN 5 MG PO TABS
5.0000 mg | ORAL_TABLET | Freq: Two times a day (BID) | ORAL | 1 refills | Status: DC
  Filled 2024-05-26: qty 180, 90d supply, fill #0
  Filled 2024-08-15: qty 60, 30d supply, fill #0
  Filled 2024-09-06: qty 180, 90d supply, fill #0

## 2024-05-26 MED ORDER — METOPROLOL TARTRATE 25 MG PO TABS
25.0000 mg | ORAL_TABLET | Freq: Two times a day (BID) | ORAL | 1 refills | Status: DC
  Filled 2024-05-26 – 2024-07-25 (×3): qty 180, 90d supply, fill #0
  Filled 2024-10-05: qty 180, 90d supply, fill #1

## 2024-05-26 MED ORDER — DILTIAZEM HCL ER COATED BEADS 120 MG PO CP24
120.0000 mg | ORAL_CAPSULE | Freq: Every day | ORAL | 1 refills | Status: AC
  Filled 2024-05-26 – 2024-08-15 (×2): qty 90, 90d supply, fill #0
  Filled 2024-08-18: qty 30, 30d supply, fill #0
  Filled 2024-09-01: qty 30, 30d supply, fill #1
  Filled 2024-09-02: qty 10, 10d supply, fill #2
  Filled 2024-09-02: qty 30, 30d supply, fill #1
  Filled 2024-09-02: qty 3, 3d supply, fill #2
  Filled 2024-09-05 – 2024-09-15 (×2): qty 30, 30d supply, fill #2
  Filled 2024-10-05: qty 30, 30d supply, fill #3
  Filled 2024-11-30: qty 30, 30d supply, fill #4

## 2024-05-26 MED ORDER — ATORVASTATIN CALCIUM 40 MG PO TABS
40.0000 mg | ORAL_TABLET | Freq: Every day | ORAL | 1 refills | Status: DC
  Filled 2024-05-26: qty 90, 90d supply, fill #0

## 2024-05-26 MED ORDER — FLECAINIDE ACETATE 50 MG PO TABS
50.0000 mg | ORAL_TABLET | Freq: Two times a day (BID) | ORAL | 5 refills | Status: DC
  Filled 2024-05-26 – 2024-08-18 (×3): qty 225, 113d supply, fill #0
  Filled 2024-09-01: qty 60, 30d supply, fill #0
  Filled 2024-09-24 (×2): qty 60, 30d supply, fill #1
  Filled 2024-10-21: qty 60, 30d supply, fill #2
  Filled 2024-11-18: qty 60, 30d supply, fill #3

## 2024-05-26 MED FILL — Clonazepam Tab 0.5 MG: ORAL | 15 days supply | Qty: 30 | Fill #1 | Status: AC

## 2024-05-26 NOTE — Telephone Encounter (Signed)
 Per verbal order by Dr. Alveta in clinic. Please refill all cardiac medications for this patient for 6 months. Refills sent to pharmacy on file at this time.

## 2024-05-26 NOTE — Telephone Encounter (Signed)
 Pt requesting a c/b from [place] only in regards to her insurance.

## 2024-05-27 ENCOUNTER — Ambulatory Visit: Payer: Commercial Managed Care - PPO | Admitting: Cardiovascular Disease

## 2024-05-30 ENCOUNTER — Other Ambulatory Visit: Payer: Self-pay

## 2024-05-31 NOTE — Telephone Encounter (Signed)
 Called and spoke to patient who states that she has had her issues resolved. Appreciates call back

## 2024-06-24 ENCOUNTER — Other Ambulatory Visit: Payer: Self-pay

## 2024-07-04 ENCOUNTER — Ambulatory Visit: Payer: Commercial Managed Care - PPO | Admitting: Family Medicine

## 2024-07-06 ENCOUNTER — Other Ambulatory Visit: Payer: Self-pay | Admitting: Family Medicine

## 2024-07-06 ENCOUNTER — Other Ambulatory Visit: Payer: Self-pay

## 2024-07-07 ENCOUNTER — Other Ambulatory Visit: Payer: Self-pay

## 2024-07-07 MED ORDER — ZEPBOUND 5 MG/0.5ML ~~LOC~~ SOAJ
5.0000 mg | SUBCUTANEOUS | 5 refills | Status: DC
  Filled 2024-07-07 – 2024-09-15 (×5): qty 2, 28d supply, fill #0

## 2024-07-13 ENCOUNTER — Ambulatory Visit: Payer: Self-pay

## 2024-07-13 NOTE — Telephone Encounter (Signed)
 Called patient back and advised Dr.B will be in until Monday and we will see if she can work patient in.  Offered an appointment with another provider in the office.Patient doesn't want to see any else. Patient states that if she needs to go to UC/ED that she will.

## 2024-07-13 NOTE — Telephone Encounter (Signed)
 Recommend patient go ahead and schedule soonest available with her PCP, and check with Dr. B when she returns Monday to see if she wants to work patient in sooner.

## 2024-07-13 NOTE — Telephone Encounter (Signed)
 FYI Only or Action Required?: Action required by provider: request for appointment, referral request, and update on patient condition.  Patient was last seen in primary care on 01/05/2024 by Myrla Jon HERO, MD.  Called Nurse Triage reporting Dysphagia.  Symptoms began 5-6 weeks ago.  Interventions attempted: OTC medications: Prilosec, prescription medication: clonazepam .  Symptoms are: anxiety, difficulty swallowing (states pills and food feel like they get stuck which then causes her to get anxious), vomiting episode 2 days ago after eating unchanged.  Triage Disposition: See Physician Within 24 Hours  Patient/caregiver understands and will follow disposition?:                  Copied from CRM [id number]. Topic: Clinical - Red Word Triage >> Jul 13, 2024 10:29 AM Avram MATSU wrote: Red Word that prompted transfer to Nurse Triage: having trouble swallowing and its painful and its getting worse. Reason for Disposition  [1] Swallowing difficulty AND [2] cause unknown  (Exception: Difficulty swallowing is a chronic symptom.)  Answer Assessment - Initial Assessment Questions Patient states she does not have any insurance so she is concerned if she should go to ED or come in and see her PCP. Advised patient that it does sound like a GI problem and patient should be seen in office to discuss with provider and possible GI referral. Patient states she would like to keep her October appointment and advised that she come in sooner. No available appts sooner than August 25th. Patient states she would like to keep her October 6th appt as she will have insurance then. She is requesting for PCP review symptoms, clinic call back with advice if they can see her sooner.    1. DESCRIPTION: Tell me more about this problem. Are you  having trouble swallowing liquids, solids, or both? Any trouble with swallowing saliva (spit)?     Patient states she is not drooling; she is able to swallow  saliva/liquids/foods. She states she gets a sensation when eating food, sometimes even yogurt, and taking medications it feels like it gets stuck. She states 2 days ago she took a big bite of food and did not chew enough, she states it felt like it got stuck in her esophagus. She states she vomited. She states this causes her to get more anxious.  2. SEVERITY: How bad is the swallowing difficulty?  (Scale 1-10; or mild, moderate, severe)     She states on a scale of 1-10, she states it is 2-3/10 at this time. She states swallowing causes discomfort in her sternum/back/esophagus. She states it is not exactly chest pain.   3. ONSET: When did the swallowing problems begin?      5 weeks ago.  4. CAUSE: What do you think is causing the problem?  (e.g., dry mouth, food or pill stuck in throat, mouth pain, sore throat, progression of disease process such as dementia or Parkinson's disease).      She states she thinks it could be a stricture or silent GERD; she states she has never had an EGD or dilation performed.  5. CHRONIC or RECURRENT: Is this a new problem for you?  If No, ask: How long have you had this problem? (e.g., days, weeks, months)      She states this is a new problems for her. She has been having this problem for 5-6 weeks.  6. OTHER SYMPTOMS: Do you have any other symptoms? (e.g., chest pain, difficulty breathing, mouth sores, sore throat, swollen tongue, chest pain)  Anxiety, she states it feels like a swelling just below her sternum on the right side (she states no history of hernia). Patient denies difficulty breathing, facial swelling, tongue swelling.  7. PREGNANCY: Is there any chance you are pregnant? When was your last menstrual period?     N/A.  Protocols used: Swallowing Difficulty-A-AH

## 2024-07-18 ENCOUNTER — Ambulatory Visit: Payer: Self-pay | Admitting: Family Medicine

## 2024-07-18 ENCOUNTER — Other Ambulatory Visit: Payer: Self-pay

## 2024-07-21 ENCOUNTER — Ambulatory Visit: Payer: Self-pay | Admitting: Family Medicine

## 2024-07-22 ENCOUNTER — Other Ambulatory Visit: Payer: Self-pay

## 2024-07-22 ENCOUNTER — Other Ambulatory Visit: Payer: Self-pay | Admitting: Family Medicine

## 2024-07-22 MED FILL — Lamotrigine Tab 100 MG: ORAL | 84 days supply | Qty: 168 | Fill #0 | Status: AC

## 2024-07-25 ENCOUNTER — Other Ambulatory Visit: Payer: Self-pay

## 2024-07-25 NOTE — Telephone Encounter (Signed)
 Requested medication (s) are due for refill today: yes  Requested medication (s) are on the active medication list: yes  Last refill:  03/03/24  Future visit scheduled: yes  Notes to clinic:  Unable to refill per protocol, cannot delegate.      Requested Prescriptions  Pending Prescriptions Disp Refills   traZODone  (DESYREL ) 100 MG tablet 90 tablet 1    Sig: Take 1 tablet (100 mg total) by mouth at bedtime as needed.     Psychiatry: Antidepressants - Serotonin Modulator Failed - 07/25/2024  8:59 AM      Failed - Valid encounter within last 6 months    Recent Outpatient Visits           6 months ago Encounter for annual physical exam   [facility], Jon HERO, MD               clonazePAM  (KLONOPIN ) 0.5 MG tablet 30 tablet 1    Sig: Take 1 tablet (0.5 mg total) by mouth 2 (two) times daily as needed for anxiety.     Not Delegated - Psychiatry: Anxiolytics/Hypnotics 2 Failed - 07/25/2024  8:59 AM      Failed - This refill cannot be delegated      Failed - Urine Drug Screen completed in last 360 days      Failed - Valid encounter within last 6 months    Recent Outpatient Visits           6 months ago Encounter for annual physical exam   [facility] [place], Jon HERO, MD              Passed - Patient is not pregnant

## 2024-07-26 ENCOUNTER — Other Ambulatory Visit: Payer: Self-pay

## 2024-07-26 MED ORDER — CLONAZEPAM 0.5 MG PO TABS
0.5000 mg | ORAL_TABLET | Freq: Two times a day (BID) | ORAL | 1 refills | Status: DC | PRN
  Filled 2024-07-26: qty 18, 9d supply, fill #0
  Filled 2024-07-26: qty 12, 6d supply, fill #0
  Filled 2024-09-01: qty 30, 15d supply, fill #1

## 2024-07-26 MED ORDER — TRAZODONE HCL 100 MG PO TABS
100.0000 mg | ORAL_TABLET | Freq: Every evening | ORAL | 1 refills | Status: DC | PRN
  Filled 2024-07-26: qty 90, 90d supply, fill #0
  Filled 2024-09-01: qty 30, 30d supply, fill #1
  Filled 2024-09-23 – 2024-09-24 (×3): qty 30, 30d supply, fill #2
  Filled 2024-10-21: qty 30, 30d supply, fill #3

## 2024-08-15 ENCOUNTER — Other Ambulatory Visit: Payer: Self-pay

## 2024-08-18 ENCOUNTER — Telehealth: Payer: Self-pay

## 2024-08-18 ENCOUNTER — Ambulatory Visit: Payer: Self-pay

## 2024-08-18 ENCOUNTER — Encounter: Payer: Self-pay | Admitting: Family Medicine

## 2024-08-18 ENCOUNTER — Other Ambulatory Visit: Payer: Self-pay

## 2024-08-18 ENCOUNTER — Telehealth: Payer: Self-pay | Admitting: Family Medicine

## 2024-08-18 DIAGNOSIS — F319 Bipolar disorder, unspecified: Secondary | ICD-10-CM

## 2024-08-18 DIAGNOSIS — F3162 Bipolar disorder, current episode mixed, moderate: Secondary | ICD-10-CM

## 2024-08-18 DIAGNOSIS — R1319 Other dysphagia: Secondary | ICD-10-CM

## 2024-08-18 DIAGNOSIS — K219 Gastro-esophageal reflux disease without esophagitis: Secondary | ICD-10-CM

## 2024-08-18 DIAGNOSIS — I480 Paroxysmal atrial fibrillation: Secondary | ICD-10-CM

## 2024-08-18 DIAGNOSIS — R1314 Dysphagia, pharyngoesophageal phase: Secondary | ICD-10-CM

## 2024-08-18 MED ORDER — APIXABAN 5 MG PO TABS
5.0000 mg | ORAL_TABLET | Freq: Two times a day (BID) | ORAL | 0 refills | Status: DC
Start: 2024-08-18 — End: 2024-09-13

## 2024-08-18 NOTE — Progress Notes (Signed)
 MyChart Video Visit    Virtual Visit via Video Note   This format is felt to be most appropriate for this patient at this time. Physical exam was limited by quality of the video and audio technology used for the visit.    Patient location: home Provider location: [facility] Persons involved in the visit: patient, provider  I discussed the limitations of evaluation and management by telemedicine and the availability of in person appointments. The patient expressed understanding and agreed to proceed.  Patient: Kathleen Perkins   DOB: 09/29/1961   63 y.o. Female  MRN: [id number] Visit Date: 08/18/2024  Today's healthcare provider: Jon Eva, MD   No chief complaint on file.  Subjective    HPI   Discussed the use of AI scribe software for clinical note transcription with the patient, who gave verbal consent to proceed.  History of Present Illness   Kathleen Perkins is a 63 year old female who presents with difficulty swallowing and regurgitation.  Since early July, she has experienced changes in swallowing, initially noted while eating lasagna, which led to vomiting. She describes a sensation at the bottom of her esophagus, likened to a 'finger' moving from right to left. This sensation has persisted, causing anxiety and impacting her ability to eat comfortably, especially in social settings. She experiences difficulty swallowing solids, particularly meat and rice, which tend to 'ball up' and cause regurgitation. She does not have issues with liquids. She has been eating softer foods like yogurt and is cautious with solid foods, chewing deliberately.  She has not experienced GERD symptoms previously but started taking over-the-counter Prilosec after researching her symptoms. She reports nighttime coughing but denies feeling acid reflux. She takes Prilosec 20 mg daily. She is concerned about her medication intake, particularly with larger pills like lamotrigine ,  which feel like they get stuck. She is also running low on Eliquis  and is concerned about her insurance coverage for medications.  She has a history of bipolar disorder, managed with Lamictal , and reports feeling tired and emotionally affected by her swallowing issues. No psychosis or suicidal ideation, but she acknowledges feeling down due to her current health challenges. She has a history of atrial fibrillation and is taking metoprolol  and flecainide . She reports that her AFib is not her biggest concern at this time. She mentions anxiety related to her health issues and the impact on her mental state. No significant weight loss, though she is eating softer, potentially higher-calorie foods. No changes in bowel movements, blood in stool, or other gastrointestinal symptoms.         Review of Systems      Objective    There were no vitals taken for this visit.      Physical Exam Constitutional:      General: She is not in acute distress.    Appearance: Normal appearance.  HENT:     Head: Normocephalic.  Pulmonary:     Effort: Pulmonary effort is normal. No respiratory distress.  Neurological:     Mental Status: She is alert and oriented to person, place, and time. Mental status is at baseline.        Assessment & Plan     Problem List Items Addressed This Visit       Cardiovascular and Mediastinum   Paroxysmal atrial fibrillation (HCC)     Other   Bipolar affective (HCC)   Other Visit Diagnoses       Esophageal dysphagia    -  Primary   Relevant Orders   Ambulatory referral to Gastroenterology     Gastroesophageal reflux disease, unspecified whether esophagitis present               Dysphagia, solids predominant Dysphagia with solids since early July, with sensation of obstruction at the bottom of the esophagus. No issues with liquids. Symptoms include regurgitation and anxiety during meals. No significant weight loss reported. Differential includes  esophageal stricture or other anatomical obstruction. She is apprehensive about eating in public due to fear of choking. - Refer to GI for evaluation and possible endoscopy - Advise eating softer foods and chewing thoroughly - Consult pharmacist about crushing medications or switching to liquid forms  Gastroesophageal reflux disease (GERD) Possible GERD contributing to dysphagia and nighttime coughing. No typical heartburn symptoms. Started on over-the-counter Prilosec (omeprazole) with some improvement. GERD may be asymptomatic in some patients, presenting with atypical symptoms like dysphagia and cough. - Increase omeprazole to 40 mg daily (20 mg BID) - Advise elevating head of bed and avoiding meals 2 hours before sleep - Continue dietary modifications to avoid triggering foods  Atrial fibrillation Atrial fibrillation, currently well-managed. Concerns about medication supply due to insurance issues. She is aware of the importance of medication adherence. - Count Eliquis  supply and contact office if additional samples are needed - Ensure plan to obtain medications once insurance is active  Bipolar affective disorder Bipolar disorder managed with lamotrigine . She reports feeling down due to current health issues but denies severe depressive or manic symptoms. No recent hospitalizations or severe mood episodes. She expresses concern about potential medication side effects and is cautious about treatment changes. - Continue current lamotrigine  regimen       No orders of the defined types were placed in this encounter.    Return for as scheduled.     I discussed the assessment and treatment plan with the patient. The patient was provided an opportunity to ask questions and all were answered. The patient agreed with the plan and demonstrated an understanding of the instructions.   The patient was advised to call back or seek an in-person evaluation if the symptoms worsen or if the  condition fails to improve as anticipated.   Jon Eva, MD [facility] [phone] (phone) [phone] (fax)  [facility]

## 2024-08-18 NOTE — Telephone Encounter (Addendum)
 Patient called back in to schedule virtual visit today with pcp, no new symptoms, she just want to discuss current symptoms with pcp today. She currently does not have insurance and scheduled in  person visit in October after insurance goes into effect. She would like to keep in person visit as follow up in addition to virtual visit today.

## 2024-08-18 NOTE — Addendum Note (Signed)
 Addended by: LILIAN SEVERO RAMAN on: 08/18/2024 02:08 PM   Modules accepted: Orders

## 2024-08-18 NOTE — Telephone Encounter (Signed)
 Copied from CRM #[id number]. Topic: Clinical - Medication Question >> Aug 18, 2024  1:22 PM DeAngela L wrote: Reason for CRM: patient is asking if Dr Bacigalupo could give possible give her some samples of ELIQUIS  until her insurance starts at her employer, this was a discussion at her appointment   She would like to ask if her boyfriend can pick up this sample for her this evening when he picks up her letters in the office and if not could someone call and let her know?  Pt num [phone] (M)

## 2024-08-18 NOTE — Telephone Encounter (Signed)
 FYI Only or Action Required?: Action required by provider: request for appointment.  Patient was last seen in primary care on 01/05/2024 by Myrla Jon HERO, MD.  Called Nurse Triage reporting Dysphagia.  Symptoms began several months ago.  Interventions attempted: Rest, hydration, or home remedies.  Symptoms are: unchanged.  Triage Disposition: See PCP Within 2 Weeks  Patient/caregiver understands and will follow disposition?: Unsure  Copied from CRM [id number]. Topic: Clinical - Red Word Triage >> Aug 18, 2024  8:05 AM Tiffini S wrote: [facility] that prompted transfer to Nurse Triage:  Patient cannot swallow- vomited and got into her lungs/ coughing Reason for Disposition  Swallowing difficulty is a chronic symptom (recurrent or ongoing AND present > 4 weeks)  Answer Assessment - Initial Assessment Questions 1. DESCRIPTION: Tell me more about this problem. Are you  having trouble swallowing liquids, solids, or both? Any trouble with swallowing saliva (spit)?     Pt states that she has trouble swallowing since July, states that she believes she aspirated.  2. SEVERITY: How bad is the swallowing difficulty?  (Scale 1-10; or mild, moderate, severe)     Difficult with food and other edible objects, managing secretions without concern 3. ONSET: When did the swallowing problems begin?      July 4. CAUSE: What do you think is causing the problem?  (e.g., dry mouth, food or pill stuck in throat, mouth pain, sore throat, progression of disease process such as dementia or Parkinson's disease).      Unsure, pt believes she needs scope but does not have insurance until after oct 1 5. CHRONIC or RECURRENT: Is this a new problem for you?  If No, ask: How long have you had this problem? (e.g., days, weeks, months)      recurrent 6. OTHER SYMPTOMS: Do you have any other symptoms? (e.g., chest pain, difficulty breathing, mouth sores, sore throat, swollen tongue, chest pain)      denies  Pt states that she feels she aspirated, states that she has a barky cough. Pt requesting VV with PCP to get a work note. Pt states that she does not have insurance and is worried about w/u with bills. Pt specifically requesting a VV, but does not want the information sent to her mychart pt is requesting that it goes to her email.  Protocols used: Swallowing Difficulty-A-AH

## 2024-08-18 NOTE — Telephone Encounter (Signed)
 Noted

## 2024-08-18 NOTE — Telephone Encounter (Signed)
 Called patient and let her know samples are up front for her to pick up.

## 2024-08-19 ENCOUNTER — Telehealth: Payer: Self-pay | Admitting: Family Medicine

## 2024-08-19 NOTE — Telephone Encounter (Signed)
 Copied from CRM [id number]. Topic: Referral - Question >> Aug 19, 2024  1:01 PM Montie POUR wrote: Reason for CRM:  Kathleen Perkins would like a call back at [phone] to discuss when her Ambulatory referral to Gastroenterology will be sent to [facility]. She feels she needs the referral as soon as possible. Thanks

## 2024-08-24 ENCOUNTER — Other Ambulatory Visit: Payer: Self-pay

## 2024-08-24 MED ORDER — AMOXICILLIN 500 MG PO CAPS
2000.0000 mg | ORAL_CAPSULE | Freq: Every day | ORAL | 0 refills | Status: DC
  Filled 2024-08-24: qty 12, 3d supply, fill #0

## 2024-08-30 NOTE — Progress Notes (Unsigned)
 08/30/2024 Kathleen Perkins [id number] [birth year]  Referring provider: Myrla Jon HERO, MD/Dr. Nahser Primary GI doctor: {acdocs:[id number]}  ASSESSMENT AND PLAN:  Dysphagia  Screening colonoscopy  Atrial fibrillation  On flecainide  and Eliquis  5 mg twice daily Follows with Dr.Nahser, last seen 2//2025 Being considered for ablation, likely needs EGD prior to TEE TEE  Mitral valve prolapse  History of CVA 2023  Patient Care Team: Myrla Jon HERO, MD as PCP - General (Family Medicine) Nahser, Aleene PARAS, MD (Inactive) as PCP - Cardiology (Cardiology)  HISTORY OF PRESENT ILLNESS: 63 y.o. female with a past medical history listed below presents for evaluation of ***.   *** Discussed the use of AI scribe software for clinical note transcription with the patient, who gave verbal consent to proceed.  History of Present Illness            She  reports that she has never smoked. She has never used smokeless tobacco. She reports that she does not drink alcohol and does not use drugs.  RELEVANT GI HISTORY, IMAGING AND LABS: Results          CBC    Component Value Date/Time   WBC 8.2 09/08/2022 1333   WBC 7.9 08/29/2022 0441   RBC 4.40 09/08/2022 1333   RBC 4.76 08/29/2022 0441   HGB 14.1 09/08/2022 1333   HCT 40.1 09/08/2022 1333   PLT 337 09/08/2022 1333   MCV 91 09/08/2022 1333   MCH 32.0 09/08/2022 1333   MCH 30.7 08/29/2022 0441   MCHC 35.2 09/08/2022 1333   MCHC 33.7 08/29/2022 0441   RDW 12.5 09/08/2022 1333   LYMPHSABS 1.6 08/27/2022 0426   LYMPHSABS 1.9 11/08/2020 1423   MONOABS 1.0 08/27/2022 0426   EOSABS 0.0 08/27/2022 0426   EOSABS 0.2 11/08/2020 1423   BASOSABS 0.0 08/27/2022 0426   BASOSABS 0.0 11/08/2020 1423   No results for input(s): HGB in the last 8760 hours.  CMP     Component Value Date/Time   NA 142 09/08/2022 1333   K 4.4 09/08/2022 1333   CL 100 09/08/2022 1333   CO2 24 09/08/2022 1333   GLUCOSE 102 (H) 09/08/2022  1333   GLUCOSE 96 08/29/2022 0441   BUN 8 09/08/2022 1333   CREATININE 0.81 09/08/2022 1333   CALCIUM  9.9 09/08/2022 1333   PROT 7.8 08/26/2022 0409   PROT 7.7 11/08/2020 1423   ALBUMIN 4.5 08/26/2022 0409   ALBUMIN 5.0 (H) 11/08/2020 1423   AST 23 08/26/2022 0409   ALT 19 08/26/2022 0409   ALKPHOS 77 08/26/2022 0409   BILITOT 0.7 08/26/2022 0409   BILITOT 0.4 11/08/2020 1423   GFRNONAA >60 08/29/2022 0441   GFRAA 88 11/08/2020 1423      Latest Ref Rng & Units 08/26/2022    4:09 AM 11/08/2020    2:23 PM  Hepatic Function  Total Protein 6.5 - 8.1 g/dL 7.8  7.7   Albumin 3.5 - 5.0 g/dL 4.5  5.0   AST 15 - 41 U/L 23  30   ALT 0 - 44 U/L 19  32   Alk Phosphatase 38 - 126 U/L 77  93   Total Bilirubin 0.3 - 1.2 mg/dL 0.7  0.4       Current Medications:    Current Outpatient Medications (Cardiovascular):    atorvastatin  (LIPITOR) 40 MG tablet, Take 1 tablet (40 mg total) by mouth daily.   digoxin  (LANOXIN ) 0.125 MG tablet, Take 1 tablet (0.125 mg total) by mouth  daily.   diltiazem  (CARDIZEM  CD) 120 MG 24 hr capsule, Take 1 capsule (120 mg total) by mouth daily.   flecainide  (TAMBOCOR ) 50 MG tablet, Take 1 tablet (50 mg total) by mouth 2 (two) times daily. May take additional tablet daily as needed for breakthrough atrial fib.   metoprolol  tartrate (LOPRESSOR ) 25 MG tablet, Take 1 tablet (25 mg total) by mouth 2 (two) times daily. May take an additional tablet daily for breakthrough atrial fib (NDC [address]-99)   propranolol  (INDERAL ) 10 MG tablet, Take 1 tablet (10 mg total) by mouth up to 4 (four) times daily as needed for breakthrough episodes of atrial fibrillation.   Current Outpatient Medications (Analgesics):    HYDROcodone -acetaminophen  (NORCO) 5-325 MG tablet, Take 1 tablet by mouth at bedtime.  Current Outpatient Medications (Hematological):    apixaban  (ELIQUIS ) 5 MG TABS tablet, Take 1 tablet (5 mg total) by mouth 2 (two) times daily.   apixaban  (ELIQUIS ) 5 MG TABS  tablet, Take 1 tablet (5 mg total) by mouth 2 (two) times daily. This is a sample   apixaban  (ELIQUIS ) 5 MG TABS tablet, Take 1 tablet (5 mg total) by mouth 2 (two) times daily.   apixaban  (ELIQUIS ) 5 MG TABS tablet, Take 1 tablet (5 mg total) by mouth 2 (two) times daily.   folic acid  (FOLVITE ) 1 MG tablet, Take 1 tablet (1 mg total) by mouth daily.  Current Outpatient Medications (Other):    amoxicillin  (AMOXIL ) 500 MG capsule, Take 4 capsules (2,000 mg total) by mouth 1 hour prior to dental appointment   amoxicillin  (AMOXIL ) 500 MG capsule, Take 4 capsules (2,000 mg total) by mouth 1 hour prior to dental appointment.   clonazePAM  (KLONOPIN ) 0.5 MG tablet, Take 1 tablet (0.5 mg total) by mouth 2 (two) times daily as needed for anxiety.   hydrOXYzine  (ATARAX ) 10 MG tablet, Take 1 tablet (10 mg total) by mouth 3 (three) times daily as needed.   lamoTRIgine  (LAMICTAL ) 100 MG tablet, Take 1 tablet (100 mg total) by mouth 2 (two) times daily. [facility] [phone])   thiamine  (VITAMIN B-1) 100 MG tablet, Take 1 tablet (100 mg total) by mouth daily.   tirzepatide  (ZEPBOUND ) 5 MG/0.5ML Pen, Inject 5 mg into the skin once a week.   traZODone  (DESYREL ) 100 MG tablet, Take 1 tablet (100 mg total) by mouth at bedtime as needed.  Medical History:  Past Medical History:  Diagnosis Date   Anxiety    Cerebrovascular accident (CVA) (HCC) 08/26/2022   Melanoma of upper arm (HCC)    MVP (mitral valve prolapse)    Primary osteoarthritis of left knee 08/15/2014   Allergies:  Allergies  Allergen Reactions   Ivp Dye [Iodinated Contrast Media] Shortness Of Breath    Patient had wheezing and hypoxia after returning from IV dye CT   Codeine Nausea Only   Erythromycin Nausea Only   Nsaids Other (See Comments)    Gi Bleed   Ibuprofen Nausea Only    Gi bleed     Surgical History:  She  has a past surgical history that includes Replacement total knee; Arthroscopic repair ACL; Melanoma excision; Breast biopsy  (Left, 06/23/2013); and Breast excisional biopsy (Left, 07/14/2013). Family History:  Her family history includes Heart attack in her brother; Heart disease in her brother and father; Hyperlipidemia in her brother and father; Hypertension in her brother and father.  REVIEW OF SYSTEMS  : All other systems reviewed and negative except where noted in the History of Present Illness.  PHYSICAL EXAM: There were no vitals taken for this visit. Physical Exam          Alan JONELLE Coombs, PA-C 7:48 AM

## 2024-08-31 ENCOUNTER — Telehealth: Payer: Self-pay | Admitting: *Deleted

## 2024-08-31 ENCOUNTER — Ambulatory Visit (INDEPENDENT_AMBULATORY_CARE_PROVIDER_SITE_OTHER): Payer: Self-pay | Admitting: Physician Assistant

## 2024-08-31 ENCOUNTER — Encounter: Payer: Self-pay | Admitting: Physician Assistant

## 2024-08-31 ENCOUNTER — Other Ambulatory Visit: Payer: Self-pay

## 2024-08-31 VITALS — BP 110/70 | HR 73 | Ht 64.0 in | Wt 172.1 lb

## 2024-08-31 DIAGNOSIS — Z01818 Encounter for other preprocedural examination: Secondary | ICD-10-CM

## 2024-08-31 DIAGNOSIS — R195 Other fecal abnormalities: Secondary | ICD-10-CM

## 2024-08-31 DIAGNOSIS — F3162 Bipolar disorder, current episode mixed, moderate: Secondary | ICD-10-CM

## 2024-08-31 DIAGNOSIS — R1310 Dysphagia, unspecified: Secondary | ICD-10-CM

## 2024-08-31 DIAGNOSIS — I341 Nonrheumatic mitral (valve) prolapse: Secondary | ICD-10-CM

## 2024-08-31 DIAGNOSIS — I480 Paroxysmal atrial fibrillation: Secondary | ICD-10-CM

## 2024-08-31 MED ORDER — POLYETHYLENE GLYCOL 3350 17 GM/SCOOP PO POWD
17.0000 g | Freq: Every day | ORAL | 3 refills | Status: DC
  Filled 2024-08-31: qty 238, 14d supply, fill #0
  Filled 2024-09-15: qty 238, 14d supply, fill #1
  Filled 2024-09-24 (×2): qty 238, 14d supply, fill #2
  Filled 2024-10-05: qty 238, 14d supply, fill #3

## 2024-08-31 MED ORDER — NA SULFATE-K SULFATE-MG SULF 17.5-3.13-1.6 GM/177ML PO SOLN
1.0000 | Freq: Once | ORAL | 0 refills | Status: AC
Start: 2024-08-31 — End: 2024-09-03
  Filled 2024-08-31: qty 354, 1d supply, fill #0

## 2024-08-31 NOTE — Patient Instructions (Addendum)
 2 days before your procedure. Please do the following: Purchase a bottle of Miralax  over the counter .Kathleen Perkins You will then drink 6-8 capfuls of Miralax  mixed in an adequate amount of water /juice/gatorade (you may choose which of these liquids to drink) over the next 2-3 hours. You should expect results within 1 to 6 hours after completing the bowel purge.  Continue clear liquids, until the start of the procedure    VISIT SUMMARY   Today, we discussed your recent difficulties with swallowing and your positive Cologuard test. We also reviewed your history of atrial fibrillation and your current medications. We have planned further tests to understand your symptoms better and ensure appropriate treatment.  YOUR PLAN:  -DYSPHAGIA: Dysphagia means difficulty swallowing. You have been experiencing this since early July, with a sensation of food being stuck in your esophagus, weight loss, and occasional aspiration. We will perform an EGD (a procedure to look inside your esophagus) with possible dilation and biopsy on October 7th. Please stop taking Eliquis  two days before the procedure and ensure you have cardiac clearance for this. In the meantime, consume soft and liquid foods, and go to the ER if food gets stuck and does not resolve.  -POSITIVE COLOGUARD TEST: A positive Cologuard test indicates the presence of blood in your stool, which can be a sign of colon issues. We will schedule a colonoscopy on October 7th to investigate further, pending management of your Eliquis .  -CONSTIPATION: Constipation means having infrequent or hard-to-pass bowel movements. You are currently managing this with Sienna, and you should continue this treatment as it is working for you.  INSTRUCTIONS:  Please stop taking Eliquis  two days before your EGD and colonoscopy on October 7th. Ensure you have cardiac clearance for holding Eliquis . If you experience food impaction that does not resolve, go to the ER  immediately.  Due to recent changes in healthcare laws, you may see the results of your imaging and laboratory studies on MyChart before your provider has had a chance to review them.  We understand that in some cases there may be results that are confusing or concerning to you. Not all laboratory results come back in the same time frame and the provider may be waiting for multiple results in order to interpret others.  Please give us  48 hours in order for your provider to thoroughly review all the results before contacting the office for clarification of your results.    I appreciate the  opportunity to care for you  Thank You  [facility]

## 2024-08-31 NOTE — Progress Notes (Signed)
 Reviewed. Very high risk patient (A-fib with chronic anticoagulation) set up for colonoscopy (positive Cologuard) and upper endoscopy (dysphagia and weight loss) with myself to expedient care. I am happy to perform the procedures to expedite care. She will resume general GI care with Dr. Leigh, as needed, thereafter. Thanks, Dr.  Abran

## 2024-08-31 NOTE — Progress Notes (Signed)
 Agree with assessment and plan as outlined.

## 2024-08-31 NOTE — Telephone Encounter (Signed)
 [facility] Pre-operative Risk Assessment     Request for surgical clearance:     Endoscopy Procedure  What type of surgery is being performed?     Endoscopy/Colonoscopy  When is this surgery scheduled?     09/13/2024  What type of clearance is required ?   Pharmacy  Are there any medications that need to be held prior to surgery and how long? ELIQUIS  x 2 days   Practice name and name of physician performing surgery?      [facility]  What is your office phone and fax number?      Phone- [phone]  Fax- [phone]  Anesthesia type (None, local, MAC, general) ?       MAC   Please route your response to [employer]

## 2024-09-01 ENCOUNTER — Other Ambulatory Visit (HOSPITAL_COMMUNITY): Payer: Self-pay

## 2024-09-01 ENCOUNTER — Other Ambulatory Visit: Payer: Self-pay

## 2024-09-02 ENCOUNTER — Other Ambulatory Visit: Payer: Self-pay

## 2024-09-02 NOTE — Telephone Encounter (Signed)
   Name: Kathleen Perkins  DOB: [birth year]  MRN: [id number]  Primary Cardiologist: Aleene Passe, MD (Inactive)   Preoperative team, please contact this patient and set up a phone call appointment for further preoperative risk assessment. Please obtain consent and complete medication review. Thank you for your help.  I confirm that guidance regarding antiplatelet and oral anticoagulation therapy has been completed and, if necessary, noted below.  Labs ordered for pharm to hold Eliquis    I also confirmed the patient resides in the state of [place] . As per [facility] Medical Board telemedicine laws, the patient must reside in the state in which the provider is licensed.   Lamarr Satterfield, NP 09/02/2024, 4:54 PM [facility]

## 2024-09-02 NOTE — Telephone Encounter (Signed)
 See my notes pt asked for a call back to discuss further with APP.

## 2024-09-02 NOTE — Telephone Encounter (Signed)
 I s/w the pt about needing labs done for preop clearance, She said she works and cannot make it to get labs done, she just started a new job. She tells me that she was already told to hold her blood thinner x 2 days prior.   I state really the recommendations need to be provided by her cardiology team as they manage her blood thinner. I explained that we needed the labs done. The pt then said she would like to talk to Dr. Alveta. I stated Dr. Alveta retired, she states she knows that but one of the other cardiologist can call her.   I assured her that I will let the preop APP know of the conversation and have them call her to discuss further. Pt said thank you.

## 2024-09-02 NOTE — Telephone Encounter (Signed)
 Patient with diagnosis of afib on Eliquis  for anticoagulation.    What type of surgery is being performed?     Endoscopy/Colonoscopy  When is this surgery scheduled?     09/13/2024   ** Need updated BMP and CBC

## 2024-09-02 NOTE — Telephone Encounter (Signed)
 Jackee Alberts, NP stated he will call the pt Monday 09/05/24.

## 2024-09-02 NOTE — Telephone Encounter (Signed)
 Spoke with patient. If we put the orders in she will go by and get them drawn by Thursday Sep 08, 2024.  Plans to do so sooner if she can go by PCP office. They use [facility].Orders placed for CBC and BMET.

## 2024-09-03 ENCOUNTER — Other Ambulatory Visit: Payer: Self-pay

## 2024-09-05 ENCOUNTER — Ambulatory Visit: Payer: Self-pay | Admitting: Family Medicine

## 2024-09-05 ENCOUNTER — Other Ambulatory Visit: Payer: Self-pay

## 2024-09-05 ENCOUNTER — Telehealth (HOSPITAL_BASED_OUTPATIENT_CLINIC_OR_DEPARTMENT_OTHER): Payer: Self-pay | Admitting: *Deleted

## 2024-09-05 NOTE — Telephone Encounter (Unsigned)
 Copied from CRM [id number]. Topic: Clinical - Lab/Test Results >> Sep 02, 2024  4:56 PM Nathanel BROCKS wrote: Reason for CRM: pt is coming in for a BMP and CBC next week, ordered by your [facility] cardio prior to colonoscopy.

## 2024-09-05 NOTE — Telephone Encounter (Signed)
 I see the notes from Lamarr Satterfield DNP she s/w the pt and pt will get labs 09/08/24 or sooner this week. Procedure is 09/13/24. Per the preop APP the pt needs a tele visit as well. We will need to have labs results in for preop recommendations for her blood thinner.   Called the pt and left her a message she will need to schedule a tele preop appt as well. I was looking at scheduling her on 09/09/24 as we will need time for labs to come in and recommendations from pharm-d.

## 2024-09-05 NOTE — Telephone Encounter (Signed)
 Pt called back and has been scheduled tele preop appt 09/09/24. Med rec and consent are done.   Med rec and consent are done. Pt aware we will need labs before 09/08/24 as we will need time for results to come in and pharm-d review for recommendations.      Patient Consent for Virtual Visit        Kathleen Perkins has provided verbal consent on 09/05/2024 for a virtual visit (video or telephone).   CONSENT FOR VIRTUAL VISIT FOR:  Kathleen Perkins  By participating in this virtual visit I agree to the following:  I hereby voluntarily request, consent and authorize [facility] and its employed or contracted physicians, physician assistants, nurse practitioners or other licensed health care professionals (the Practitioner), to provide me with telemedicine health care services (the "Services) as deemed necessary by the treating Practitioner. I acknowledge and consent to receive the Services by the Practitioner via telemedicine. I understand that the telemedicine visit will involve communicating with the Practitioner through live audiovisual communication technology and the disclosure of certain medical information by electronic transmission. I acknowledge that I have been given the opportunity to request an in-person assessment or other available alternative prior to the telemedicine visit and am voluntarily participating in the telemedicine visit.  I understand that I have the right to withhold or withdraw my consent to the use of telemedicine in the course of my care at any time, without affecting my right to future care or treatment, and that the Practitioner or I may terminate the telemedicine visit at any time. I understand that I have the right to inspect all information obtained and/or recorded in the course of the telemedicine visit and may receive copies of available information for a reasonable fee.  I understand that some of the potential risks of receiving the Services via  telemedicine include:  Delay or interruption in medical evaluation due to technological equipment failure or disruption; Information transmitted may not be sufficient (e.g. poor resolution of images) to allow for appropriate medical decision making by the Practitioner; and/or  In rare instances, security protocols could fail, causing a breach of personal health information.  Furthermore, I acknowledge that it is my responsibility to provide information about my medical history, conditions and care that is complete and accurate to the best of my ability. I acknowledge that Practitioner's advice, recommendations, and/or decision may be based on factors not within their control, such as incomplete or inaccurate data provided by me or distortions of diagnostic images or specimens that may result from electronic transmissions. I understand that the practice of medicine is not an exact science and that Practitioner makes no warranties or guarantees regarding treatment outcomes. I acknowledge that a copy of this consent can be made available to me via my patient portal [facility] MyChart), or I can request a printed copy by calling the office of [facility].    I understand that my insurance will be billed for this visit.   I have read or had this consent read to me. I understand the contents of this consent, which adequately explains the benefits and risks of the Services being provided via telemedicine.  I have been provided ample opportunity to ask questions regarding this consent and the Services and have had my questions answered to my satisfaction. I give my informed consent for the services to be provided through the use of telemedicine in my medical care

## 2024-09-05 NOTE — Telephone Encounter (Signed)
 Pt called back and has been scheduled tele preop appt 09/09/24. Med rec and consent are done.   Med rec and consent are done. Pt aware we will need labs before 09/08/24 as we will need time for results to come in and pharm-d review for recommendations.

## 2024-09-06 ENCOUNTER — Other Ambulatory Visit: Payer: Self-pay

## 2024-09-07 ENCOUNTER — Telehealth: Payer: Self-pay

## 2024-09-07 ENCOUNTER — Other Ambulatory Visit (HOSPITAL_COMMUNITY): Payer: Self-pay

## 2024-09-07 ENCOUNTER — Other Ambulatory Visit: Payer: Self-pay

## 2024-09-07 NOTE — Telephone Encounter (Signed)
 Pharmacy Patient Advocate Encounter   Received notification from Onbase that prior authorization for ZEPBOUND  5 MG/0.5 ML PEN is required/requested.   Insurance verification completed.   The patient is insured through [facility].   Per test claim: PA required; PA submitted to above mentioned insurance via Prompt PA Key/confirmation #/EOC [id number] Status is pending

## 2024-09-08 ENCOUNTER — Other Ambulatory Visit: Payer: Self-pay

## 2024-09-08 ENCOUNTER — Ambulatory Visit: Payer: Self-pay | Admitting: Family Medicine

## 2024-09-08 DIAGNOSIS — E782 Mixed hyperlipidemia: Secondary | ICD-10-CM

## 2024-09-08 DIAGNOSIS — R7989 Other specified abnormal findings of blood chemistry: Secondary | ICD-10-CM

## 2024-09-08 LAB — COMPREHENSIVE METABOLIC PANEL WITH GFR
ALT: 14 IU/L (ref 0–32)
AST: 20 IU/L (ref 0–40)
Albumin: 4.7 g/dL (ref 3.9–4.9)
Alkaline Phosphatase: 125 IU/L (ref 49–135)
BUN/Creatinine Ratio: 6 — ABNORMAL LOW (ref 12–28)
BUN: 7 mg/dL — ABNORMAL LOW (ref 8–27)
Bilirubin Total: 0.3 mg/dL (ref 0.0–1.2)
CO2: 22 mmol/L (ref 20–29)
Calcium: 10.1 mg/dL (ref 8.7–10.3)
Chloride: 98 mmol/L (ref 96–106)
Creatinine, Ser: 1.17 mg/dL — ABNORMAL HIGH (ref 0.57–1.00)
Globulin, Total: 2.6 g/dL (ref 1.5–4.5)
Glucose: 113 mg/dL — ABNORMAL HIGH (ref 70–99)
Potassium: 4.3 mmol/L (ref 3.5–5.2)
Sodium: 138 mmol/L (ref 134–144)
Total Protein: 7.3 g/dL (ref 6.0–8.5)
eGFR: 53 mL/min/1.73 — ABNORMAL LOW (ref >59)

## 2024-09-08 LAB — CBC WITH DIFFERENTIAL/PLATELET
Basophils Absolute: 0.1 x10E3/uL (ref 0.0–0.2)
Basos: 1 %
EOS (ABSOLUTE): 0.2 x10E3/uL (ref 0.0–0.4)
Eos: 2 %
Hematocrit: 38.9 % (ref 34.0–46.6)
Hemoglobin: 13 g/dL (ref 11.1–15.9)
Immature Grans (Abs): 0 x10E3/uL (ref 0.0–0.1)
Immature Granulocytes: 0 %
Lymphocytes Absolute: 2.3 x10E3/uL (ref 0.7–3.1)
Lymphs: 28 %
MCH: 29.5 pg (ref 26.6–33.0)
MCHC: 33.4 g/dL (ref 31.5–35.7)
MCV: 88 fL (ref 79–97)
Monocytes Absolute: 0.7 x10E3/uL (ref 0.1–0.9)
Monocytes: 8 %
Neutrophils Absolute: 5.1 x10E3/uL (ref 1.4–7.0)
Neutrophils: 61 %
Platelets: 528 x10E3/uL — ABNORMAL HIGH (ref 150–450)
RBC: 4.41 x10E6/uL (ref 3.77–5.28)
RDW: 13.6 % (ref 11.7–15.4)
WBC: 8.4 x10E3/uL (ref 3.4–10.8)

## 2024-09-08 LAB — LIPID PANEL
Chol/HDL Ratio: 2.5 ratio (ref 0.0–4.4)
Cholesterol, Total: 211 mg/dL — ABNORMAL HIGH (ref 100–199)
HDL: 84 mg/dL (ref >39)
LDL Chol Calc (NIH): 104 mg/dL — ABNORMAL HIGH (ref 0–99)
Triglycerides: 132 mg/dL (ref 0–149)
VLDL Cholesterol Cal: 23 mg/dL (ref 5–40)

## 2024-09-08 LAB — HEPATITIS C ANTIBODY: Hep C Virus Ab: NONREACTIVE

## 2024-09-08 LAB — VITAMIN D 25 HYDROXY (VIT D DEFICIENCY, FRACTURES): Vit D, 25-Hydroxy: 42.2 ng/mL (ref 30.0–100.0)

## 2024-09-08 LAB — HEMOGLOBIN A1C
Est. average glucose Bld gHb Est-mCnc: 105 mg/dL
Hgb A1c MFr Bld: 5.3 % (ref 4.8–5.6)

## 2024-09-09 ENCOUNTER — Telehealth: Payer: Self-pay | Admitting: General Practice

## 2024-09-09 ENCOUNTER — Ambulatory Visit: Attending: General Practice

## 2024-09-09 ENCOUNTER — Other Ambulatory Visit: Payer: Self-pay

## 2024-09-09 DIAGNOSIS — Z01810 Encounter for preprocedural cardiovascular examination: Secondary | ICD-10-CM

## 2024-09-09 MED ORDER — ATORVASTATIN CALCIUM 80 MG PO TABS
80.0000 mg | ORAL_TABLET | Freq: Every day | ORAL | 1 refills | Status: DC
  Filled 2024-09-09: qty 30, 30d supply, fill #0

## 2024-09-09 NOTE — Telephone Encounter (Signed)
 Pt calling to f/u on preop appt scheduled for today 10/10. Please advise.

## 2024-09-09 NOTE — Telephone Encounter (Signed)
 Kathleen Beauvais, FNP will s/w the pt today.

## 2024-09-09 NOTE — Telephone Encounter (Signed)
 Patient is aware she can hold Eliquis  2 days prior to her procedure.

## 2024-09-09 NOTE — Telephone Encounter (Signed)
 Additional info request received in Onbase, clinical questions answered, chart notes attached and faxed back to [phone]

## 2024-09-09 NOTE — Progress Notes (Signed)
 Virtual Visit via Telephone Note   Because of Kathleen Perkins co-morbid illnesses, she is at least at moderate risk for complications without adequate follow up.  This format is felt to be most appropriate for this patient at this time.  Due to technical limitations with video connection (technology), today's appointment will be conducted as an audio only telehealth visit, and Kathleen Perkins verbally agreed to proceed in this manner.   All issues noted in this document were discussed and addressed.  No physical exam could be performed with this format.  Evaluation Performed:  Preoperative cardiovascular risk assessment _____________   Date:  09/09/2024   Patient ID:  Kathleen Perkins, DOB [birth year], MRN [id number] Patient Location:  Home Provider location:   Office  Primary Care Provider:  Myrla Jon HERO, MD Primary Cardiologist:  Aleene Passe, MD (Inactive)  Chief Complaint / Patient Profile   63 y.o. y/o female with a h/o paroxysmal atrial fibrillation who is pending colonoscopy/endoscopy and presents today for telephonic preoperative cardiovascular risk assessment.  History of Present Illness    Kathleen Perkins is a 63 y.o. female who presents via audio/video conferencing for a telehealth visit today.  Pt was last seen in cardiology clinic on 01/05/2024 by Dr. Passe.  At that time Kathleen Perkins was doing well .  The patient is now pending procedure as outlined above. Since her last visit, she continues to be stable from a cardiac standpoint.  Today she denies chest pain, shortness of breath, lower extremity edema, fatigue, palpitations, melena, hematuria, hemoptysis, diaphoresis, weakness, presyncope, syncope, orthopnea, and PND.   Past Medical History    Past Medical History:  Diagnosis Date   Anxiety    Atrial fibrillation (HCC)    Cerebrovascular accident (CVA) (HCC) 08/26/2022   GERD (gastroesophageal reflux disease)    Melanoma of upper arm (HCC)    MVP (mitral  valve prolapse)    Primary osteoarthritis of left knee 08/15/2014   Past Surgical History:  Procedure Laterality Date   ARTHROSCOPIC REPAIR ACL     BREAST BIOPSY Left 06/23/2013   u/s bx- neg   BREAST EXCISIONAL BIOPSY Left 07/14/2013   us /bx-neg   MELANOMA EXCISION     left arm.    REPLACEMENT TOTAL KNEE     left     Allergies  Allergies  Allergen Reactions   Ivp Dye [Iodinated Contrast Media] Shortness Of Breath    Patient had wheezing and hypoxia after returning from IV dye CT   Codeine Nausea Only   Erythromycin Nausea Only   Nsaids Other (See Comments)    Gi Bleed   Ibuprofen Nausea Only    Gi bleed    Home Medications    Prior to Admission medications   Medication Sig Start Date End Date Taking? Authorizing Provider  amoxicillin  (AMOXIL ) 500 MG capsule Take 4 capsules (2,000 mg total) by mouth 1 hour prior to dental appointment. 08/24/24   Edmundo Ade I., DDS  apixaban  (ELIQUIS ) 5 MG TABS tablet Take 1 tablet (5 mg total) by mouth 2 (two) times daily. 01/05/24   Nahser, Aleene PARAS, MD  apixaban  (ELIQUIS ) 5 MG TABS tablet Take 1 tablet (5 mg total) by mouth 2 (two) times daily. This is a sample 04/26/24   Nahser, Aleene PARAS, MD  apixaban  (ELIQUIS ) 5 MG TABS tablet Take 1 tablet (5 mg total) by mouth 2 (two) times daily. 05/26/24   Nahser, Aleene PARAS, MD  apixaban  (ELIQUIS ) 5 MG TABS tablet Take  1 tablet (5 mg total) by mouth 2 (two) times daily. 08/18/24   Myrla Jon HERO, MD  atorvastatin  (LIPITOR) 80 MG tablet Take 1 tablet (80 mg total) by mouth daily. 09/09/24   Bacigalupo, Angela M, MD  CARDIZEM  LA 120 MG 24 hr tablet Take 120 mg by mouth daily. 08/11/24   [provider]  clonazePAM  (KLONOPIN ) 0.5 MG tablet Take 1 tablet (0.5 mg total) by mouth 2 (two) times daily as needed for anxiety. 07/26/24   Myrla Jon HERO, MD  digoxin  (LANOXIN ) 0.125 MG tablet Take 1 tablet (0.125 mg total) by mouth daily. 08/29/22   Awanda City, MD  diltiazem  (CARDIZEM  CD) 120 MG 24  hr capsule Take 1 capsule (120 mg total) by mouth daily. 05/26/24   Nahser, Aleene PARAS, MD  flecainide  (TAMBOCOR ) 50 MG tablet Take 1 tablet (50 mg total) by mouth 2 (two) times daily. May take additional tablet daily as needed for breakthrough atrial fib. 05/26/24   Nahser, Aleene PARAS, MD  folic acid  (FOLVITE ) 1 MG tablet Take 1 tablet (1 mg total) by mouth daily. 08/29/22   Awanda City, MD  HYDROcodone -acetaminophen  (NORCO) 5-325 MG tablet Take 1 tablet by mouth at bedtime. 08/21/23   Emilio Kelly DASEN, FNP  hydrOXYzine  (ATARAX ) 10 MG tablet Take 1 tablet (10 mg total) by mouth 3 (three) times daily as needed. 05/11/24   Bacigalupo, Angela M, MD  lamoTRIgine  (LAMICTAL ) 100 MG tablet Take 1 tablet (100 mg total) by mouth 2 (two) times daily. [facility] [id number]) 04/07/24   Bacigalupo, Angela M, MD  metoprolol  succinate (TOPROL -XL) 25 MG 24 hr tablet Take 25 mg by mouth daily. 08/11/24   [provider]  metoprolol  tartrate (LOPRESSOR ) 25 MG tablet Take 1 tablet (25 mg total) by mouth 2 (two) times daily. May take an additional tablet daily for breakthrough atrial fib [facility] [id number]) 05/26/24   Nahser, Aleene PARAS, MD  omeprazole (PRILOSEC OTC) 20 MG tablet Take 20 mg by mouth daily.    [provider]  polyethylene glycol powder (GLYCOLAX /MIRALAX ) 17 GM/SCOOP powder Take 17 g by mouth daily. Dissolve 1 capful (17g) in 4-8 ounces of liquid and take by mouth daily. 08/31/24   Craig Alan SAUNDERS, PA-C  propranolol  (INDERAL ) 10 MG tablet Take 1 tablet (10 mg total) by mouth up to 4 (four) times daily as needed for breakthrough episodes of atrial fibrillation. 05/26/24   Nahser, Aleene PARAS, MD  thiamine  (VITAMIN B-1) 100 MG tablet Take 1 tablet (100 mg total) by mouth daily. 08/29/22   Awanda City, MD  tirzepatide  (ZEPBOUND ) 5 MG/0.5ML Pen Inject 5 mg into the skin once a week. 07/07/24   Myrla Jon HERO, MD  traZODone  (DESYREL ) 100 MG tablet Take 1 tablet (100 mg total) by mouth at bedtime as needed. 07/26/24    Myrla Jon HERO, MD    Physical Exam    Vital Signs:  [place] VEAR Pride does not have vital signs available for review today.  Given telephonic nature of communication, physical exam is limited. AAOx3. NAD. Normal affect.  Speech and respirations are unlabored.  Accessory Clinical Findings    None  Assessment & Plan    1.  Preoperative Cardiovascular Risk Assessment: Colonoscopy/endoscopy, 09/13/2024, [facility]      Primary Cardiologist: Aleene Passe, MD (Inactive)  Chart reviewed as part of pre-operative protocol coverage. Given past medical history and time since last visit, based on ACC/AHA guidelines, MAKELLA BUCKINGHAM would be at acceptable risk for the planned procedure without  further cardiovascular testing.   Patient was advised that if he develops new symptoms prior to surgery to contact our office to arrange a follow-up appointment.  He verbalized understanding.  Patient has not had an Afib/aflutter ablation in the last 3 months, DCCV within the last 4 weeks or a watchman implanted in the last 45 days    Per office protocol, patient can hold Eliquis  for 2 days prior to procedure.   Patient will not need bridging with Lovenox (enoxaparin) around procedure.  I will route this recommendation to the requesting party via Epic fax function and remove from pre-op pool.      Time:   Today, I have spent 5 minutes with the patient with telehealth technology discussing medical history, symptoms, and management plan.  I spent 10 minutes reviewing patient's past cardiac history and cardiac medications.    Josefa CHRISTELLA Beauvais, NP  09/09/2024, 3:50 PM

## 2024-09-09 NOTE — Telephone Encounter (Signed)
 Patient with diagnosis of atrial fibrillation on Eliquis  for anticoagulation.    What type of surgery is being performed?     Endoscopy/Colonoscopy  When is this surgery scheduled?     09/13/2024    CHA2DS2-VASc Score = 4   This indicates a 4.8% annual risk of stroke. The patient's score is based upon: CHF History: 0 HTN History: 1 Diabetes History: 0 Stroke History: 2 Vascular Disease History: 0 Age Score: 0 Gender Score: 1   Chart indicates embolic stroke 08/2022 with diagnosis of AF  CrCl 61 Platelet count 528  Patient has not had an Afib/aflutter ablation in the last 3 months, DCCV within the last 4 weeks or a watchman implanted in the last 45 days   Per office protocol, patient can hold Eliquis  for 2 days prior to procedure.   Patient will not need bridging with Lovenox (enoxaparin) around procedure.  **This guidance is not considered finalized until pre-operative APP has relayed final recommendations.**

## 2024-09-12 ENCOUNTER — Other Ambulatory Visit (HOSPITAL_COMMUNITY): Payer: Self-pay

## 2024-09-13 ENCOUNTER — Telehealth: Payer: Self-pay

## 2024-09-13 ENCOUNTER — Ambulatory Visit: Admitting: Internal Medicine

## 2024-09-13 ENCOUNTER — Other Ambulatory Visit: Payer: Self-pay

## 2024-09-13 ENCOUNTER — Encounter: Payer: Self-pay | Admitting: Internal Medicine

## 2024-09-13 VITALS — BP 147/69 | HR 59 | Temp 97.4°F | Resp 11 | Ht 64.0 in | Wt 172.0 lb

## 2024-09-13 DIAGNOSIS — D122 Benign neoplasm of ascending colon: Secondary | ICD-10-CM

## 2024-09-13 DIAGNOSIS — C153 Malignant neoplasm of upper third of esophagus: Secondary | ICD-10-CM

## 2024-09-13 DIAGNOSIS — R1310 Dysphagia, unspecified: Secondary | ICD-10-CM

## 2024-09-13 DIAGNOSIS — Z1211 Encounter for screening for malignant neoplasm of colon: Secondary | ICD-10-CM | POA: Diagnosis present

## 2024-09-13 DIAGNOSIS — K5730 Diverticulosis of large intestine without perforation or abscess without bleeding: Secondary | ICD-10-CM

## 2024-09-13 DIAGNOSIS — R195 Other fecal abnormalities: Secondary | ICD-10-CM

## 2024-09-13 DIAGNOSIS — K222 Esophageal obstruction: Secondary | ICD-10-CM

## 2024-09-13 DIAGNOSIS — K648 Other hemorrhoids: Secondary | ICD-10-CM | POA: Diagnosis not present

## 2024-09-13 DIAGNOSIS — K2289 Other specified disease of esophagus: Secondary | ICD-10-CM

## 2024-09-13 DIAGNOSIS — D125 Benign neoplasm of sigmoid colon: Secondary | ICD-10-CM

## 2024-09-13 DIAGNOSIS — K635 Polyp of colon: Secondary | ICD-10-CM

## 2024-09-13 DIAGNOSIS — K6389 Other specified diseases of intestine: Secondary | ICD-10-CM | POA: Diagnosis not present

## 2024-09-13 MED ORDER — SODIUM CHLORIDE 0.9 % IV SOLN: 500.0000 mL | INTRAVENOUS | Status: DC

## 2024-09-13 NOTE — Telephone Encounter (Signed)
-----   Message from Norleen Kiang sent at 09/13/2024  2:35 PM EDT ----- Regarding: RE: Follow-up of expedited workup There are limitations with noncontrasted studies, however we can start with that.  Thanks ----- Message ----- From: Perley Rock SAUNDERS, RN Sent: 09/13/2024   2:20 PM EDT To: Norleen LOISE Kiang, MD Subject: RE: Follow-up of expedited workup              Dr. Kiang I was ordering the ct cap with contrast and she has an allergy to contrast with shortness of breath. Looked back and all CT's have been done without contrast. Please advise. ----- Message ----- From: Kiang Norleen LOISE, MD Sent: 09/13/2024   2:12 PM EDT To: Alan SAUNDERS Coombs, PA-C; Rock SAUNDERS Perley, RN; St# Subject: Follow-up of expedited workup                  Marcey, As you know, this patient, assigned you from the PA clinic, was placed on my schedule to expedite the workup.  She has the following: 1.  Large polyps on colonoscopy.  Removed.  Tattooed.  Follow-up likely 3 years if benign. 2.  High-grade proximal esophageal stricture.  Could not pass the scope beyond.  Most likely squamous cell carcinoma.  Biopsied. 3.  Resuming Eliquis  in 48 hours 4.  Ordering CT of the chest abdomen and pelvis 5.  I will follow-up with the patient once the biopsies have returned and subsequently refer to oncology. I will keep you posted. Thanks, Norleen

## 2024-09-13 NOTE — Progress Notes (Signed)
 Report to PACU, RN, vss, BBS= Clear.

## 2024-09-13 NOTE — Telephone Encounter (Signed)
 Order placed for CT of CAP without contrast. Rad scheduling to contact pt regarding appt.

## 2024-09-13 NOTE — Progress Notes (Signed)
 Pt's states no medical or surgical changes since previsit or office visit.

## 2024-09-13 NOTE — Op Note (Signed)
 [facility] Patient Name: Kathleen Perkins Procedure Date: 09/13/2024 12:57 PM MRN: [id number] Endoscopist: Norleen SAILOR. Abran , MD, [id number] Age: 63 Referring MD:  Date of Birth: [birth year] Gender: Female Account #: [id number] Procedure:                Upper GI endoscopy with biopsies Indications:              Dysphagia, Weight loss Medicines:                Monitored Anesthesia Care Procedure:                Pre-Anesthesia Assessment:                           - Prior to the procedure, a History and Physical                            was performed, and patient medications and                            allergies were reviewed. The patient's tolerance of                            previous anesthesia was also reviewed. The risks                            and benefits of the procedure and the sedation                            options and risks were discussed with the patient.                            All questions were answered, and informed consent                            was obtained. Prior Anticoagulants: The patient has                            taken Eliquis  (apixaban ), last dose was 2 days                            prior to procedure. ASA Grade Assessment: III - A                            patient with severe systemic disease. After                            reviewing the risks and benefits, the patient was                            deemed in satisfactory condition to undergo the                            procedure.  After obtaining informed consent, the endoscope was                            passed under direct vision. Throughout the                            procedure, the patient's blood pressure, pulse, and                            oxygen saturations were monitored continuously. The                            GIF [id number] #[id number] was introduced through the                            mouth, and advanced to the most proximal  esophagus.                            No further due to obstruction. The upper GI                            endoscopy was accomplished without difficulty. The                            patient tolerated the procedure well. Scope In: Scope Out: Findings:                 The esophagus revealed a malignant appearing                            high-grade stricture immediately below the                            cricopharyngeus. This would not permit the passage                            of the standard endoscope beyond.. Biopsies were                            taken with a cold forceps for histology.                           The stomach was unable to be examined.                           The examined duodenum was unable to be examined. Complications:            No immediate complications. Estimated Blood Loss:     Estimated blood loss: none. Impression:               - Normal esophagus. Biopsied.                           - Normal stomach.                           -  Normal examined duodenum. Recommendation:           - Patient has a contact number available for                            emergencies. The signs and symptoms of potential                            delayed complications were discussed with the                            patient. Return to normal activities tomorrow.                            Written discharge instructions were provided to the                            patient.                           - Would limit diet to liquids and highly pur?ed                            materials only.                           - Continue present medications.                           - Await pathology results.                           - Resume Eliquis  in 48 hours                           - Schedule contrast-enhanced CT scan of the chest,                            abdomen, and pelvis proximal esophageal mass, rule                            out mets                            - Further recommendations after the above. Dr.                            Leigh aware of findings. Norleen SAILOR. Abran, MD 09/13/2024 2:06:09 PM This report has been signed electronically.

## 2024-09-13 NOTE — Patient Instructions (Signed)
 Discharge instructions given. Handouts on polyps,Diverticulosis and Hemorrhoids. Resume Eliquis  in 48 hours.office will schedule contrast-enhanced CT scan of the chest,abdomen ,and pelvis proximal esophageal mass. Would limit diet to liquids and highly pureed materials only.YOU HAD AN ENDOSCOPIC PROCEDURE TODAY AT THE [facility]:   Refer to the procedure report that was given to you for any specific questions about what was found during the examination.  If the procedure report does not answer your questions, please call your gastroenterologist to clarify.  If you requested that your care partner not be given the details of your procedure findings, then the procedure report has been included in a sealed envelope for you to review at your convenience later.  YOU SHOULD EXPECT: Some feelings of bloating in the abdomen. Passage of more gas than usual.  Walking can help get rid of the air that was put into your GI tract during the procedure and reduce the bloating. If you had a lower endoscopy (such as a colonoscopy or flexible sigmoidoscopy) you may notice spotting of blood in your stool or on the toilet paper. If you underwent a bowel prep for your procedure, you may not have a normal bowel movement for a few days.  Please Note:  You might notice some irritation and congestion in your nose or some drainage.  This is from the oxygen used during your procedure.  There is no need for concern and it should clear up in a day or so.  SYMPTOMS TO REPORT IMMEDIATELY:  Following lower endoscopy (colonoscopy or flexible sigmoidoscopy):  Excessive amounts of blood in the stool  Significant tenderness or worsening of abdominal pains  Swelling of the abdomen that is new, acute  Fever of 100F or higher  Following upper endoscopy (EGD)  Vomiting of blood or coffee ground material  New chest pain or pain under the shoulder blades  Painful or persistently difficult swallowing  New shortness of  breath  Fever of 100F or higher  Black, tarry-looking stools  For urgent or emergent issues, a gastroenterologist can be reached at any hour by calling [phone]. Do not use MyChart messaging for urgent concerns.    DIET:  We do recommend a small meal at first, but then you may proceed to your regular diet.  Drink plenty of fluids but you should avoid alcoholic beverages for 24 hours.  ACTIVITY:  You should plan to take it easy for the rest of today and you should NOT DRIVE or use heavy machinery until tomorrow (because of the sedation medicines used during the test).    FOLLOW UP: Our staff will call the number listed on your records the next business day following your procedure.  We will call around 7:15- 8:00 am to check on you and address any questions or concerns that you may have regarding the information given to you following your procedure. If we do not reach you, we will leave a message.     If any biopsies were taken you will be contacted by phone or by letter within the next 1-3 weeks.  Please call us  at [phone] if you have not heard about the biopsies in 3 weeks.    SIGNATURES/CONFIDENTIALITY: You and/or your care partner have signed paperwork which will be entered into your electronic medical record.  These signatures attest to the fact that that the information above on your After Visit Summary has been reviewed and is understood.  Full responsibility of the confidentiality of this discharge information lies with  you and/or your care-partner.

## 2024-09-13 NOTE — Progress Notes (Signed)
 Called to room to assist during endoscopic procedure.  Patient ID and intended procedure confirmed with present staff. Received instructions for my participation in the procedure from the performing physician.

## 2024-09-13 NOTE — Progress Notes (Signed)
 Progress Notes by Abran Norleen SAILOR, MD at 08/31/2024 8:40 AM  Author: Abran Norleen SAILOR, MD Author Type: Physician Filed: 08/31/2024 12:08 PM  Note Status: Signed Cosign: Cosign Not Required Encounter Date: 08/31/2024  Editor: Abran Norleen SAILOR, MD (Physician)             Reviewed. Very high risk patient (A-fib with chronic anticoagulation) set up for colonoscopy (positive Cologuard) and upper endoscopy (dysphagia and weight loss) with myself to expedient care. I am happy to perform the procedures to expedite care. She will resume general GI care with Dr. Leigh, as needed, thereafter. Thanks, Dr.  Abran        Progress Notes by Abran Norleen SAILOR, MD at 08/31/2024 8:40 AM  Author: Abran Norleen SAILOR, MD Author Type: Physician Filed: 08/31/2024 12:08 PM  Note Status: Signed Cosign: Cosign Not Required Encounter Date: 08/31/2024  Editor: Abran Norleen SAILOR, MD (Physician)             Reviewed. Very high risk patient (A-fib with chronic anticoagulation) set up for colonoscopy (positive Cologuard) and upper endoscopy (dysphagia and weight loss) with myself to expedient care. I am happy to perform the procedures to expedite care. She will resume general GI care with Dr. Leigh, as needed, thereafter. Thanks, Dr.  Abran        Progress Notes by Leigh Elspeth SQUIBB, MD at 08/31/2024 8:40 AM  Author: Leigh Elspeth SQUIBB, MD Author Type: Physician Filed: 08/31/2024 10:56 AM  Note Status: Signed Cosign: Cosign Not Required Encounter Date: 08/31/2024  Editor: Leigh Elspeth SQUIBB, MD (Physician)             Agree with assessment and plan as outlined        Progress Notes by Craig Alan SAUNDERS, PA-C at 08/31/2024 8:40 AM  Author: Craig Alan SAUNDERS, PA-C Author Type: Physician Assistant Certified Filed: 08/31/2024 10:54 AM  Note Status: Signed Cosign: Cosign Not Required Encounter Date: 08/31/2024  Editor: Craig Alan SAUNDERS DEVONNA (Physician Assistant Certified)             Expand All Collapse All         08/31/2024 Kathleen Perkins [id number] 08/09/1961   Referring provider: Myrla Jon HERO, MD/Dr. Nahser Primary GI doctor: Dr. Leigh   ASSESSMENT AND PLAN:  Dysphagia X July with acute dysphagia and immediate regurgitation, has had issues with solids since that time, no issues with liquids Has had 8 lbs weight loss Denies AB pain, melena, night sweats Denies GERD but possible LPR symptoms some nausea Denies NSAIDS, rare ETOH, no tobacco use history With patient's progressive issues and weight loss as well as history of positive Cologuard last year which is still not been evaluated I scheduled with first available provider, Dr. Abran. -Schedule EGD with dilatation at [facility], will get permission to hold Eliquis  2 days prior.  I discussed risks of EGD with patient today, including risk of sedation, bleeding or perforation.  Patient provides understanding and gave verbal consent to proceed. -In the interim patient advised about swallowing precautions.  -Eat slowly, chew food well before swallowing.  -Drink liquids in between each bite to avoid food impaction. - ER precautions discussed with the patient   Positive cologuard 01/2023 No family history of colon cancer History of constipation, on senna, no changes in bowel issues Denies hematochezia BM daily or every other day Continue senna, add on miralax  daily, will do MiraLAX  bowel purge day before prep, schedule colonoscopy at [facility] with EGD with first available provider. We  have discussed the risks of bleeding, infection, perforation, medication reactions, and remote risk of death associated with colonoscopy. All questions were answered and the patient acknowledges these risk and wishes to proceed.   Atrial fibrillation  On flecainide  and Eliquis  5 mg twice daily Follows with Dr.Nahser, last seen 2//2025 Being considered for ablation, likely needs EGD prior to TEE Hold Eliquis  for 2 days before procedure will instruct when and how to  resume after procedure.  Patient understands that there is a low but real risk of cardiovascular event such as heart attack, stroke, or embolism /  thrombosis, or ischemia while off Eliquis   The patient consents to proceed.  Will communicate by phone or EMR with patient's prescribing provider to confirm that holding Eliquis  is reasonable in this case.    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    Discussed the use of AI scribe software for clinical note transcription with the patient, who gave verbal consent to proceed.   History of Present Illness   Kathleen Perkins is a 63 year old female with atrial fibrillation who presents with dysphagia and a positive Cologuard test.   She has been experiencing acute dysphagia since early July, initially noticed while eating lasagna. The sensation is described as if 'a finger or something' is in her esophagus, primarily on the right side. This occurs with solids and occasionally with liquids, leading to rare aspiration. No history of reflux or heartburn, but she notes occasional throat clearing and mucus, attributed to allergies. She has lost approximately eight pounds since the onset of symptoms, which is concerning as she has never been able to lose weight easily before.   She has a history of atrial fibrillation and is currently taking Eliquis  5 mg twice daily. She has a history of a stroke, which affected her speech but not her swallowing, and she has recovered well from it.   She reports a positive Cologuard test and was told there was blood. She has not undergone a  colonoscopy yet. No dark stools, abdominal pain, or rectal pain. She experiences constipation, for which she takes Sienna, but denies diarrhea. No family history of colon or stomach cancer.   Her social history includes rare alcohol consumption and no history of smoking. She is allergic to ibuprofen and does not take Aleve. She denies any night sweats, chest discomfort, or shortness of breath. She experiences sporadic nausea, which she cannot attribute to any specific cause.       She  reports that she has never smoked. She has never used smokeless tobacco. She reports that she does not drink alcohol and does not use drugs.   RELEVANT GI HISTORY, IMAGING AND LABS: Results   LABS Cologuard: Positive for hemoglobin       CBC Labs (Brief)          Component Value Date/Time    WBC 8.2 09/08/2022 1333    WBC 7.9 08/29/2022 0441    RBC 4.40 09/08/2022 1333    RBC 4.76 08/29/2022 0441    HGB 14.1 09/08/2022 1333    HCT 40.1 09/08/2022 1333  PLT 337 09/08/2022 1333    MCV 91 09/08/2022 1333    MCH 32.0 09/08/2022 1333    MCH 30.7 08/29/2022 0441    MCHC 35.2 09/08/2022 1333    MCHC 33.7 08/29/2022 0441    RDW 12.5 09/08/2022 1333    LYMPHSABS 1.6 08/27/2022 0426    LYMPHSABS 1.9 11/08/2020 1423    MONOABS 1.0 08/27/2022 0426    EOSABS 0.0 08/27/2022 0426    EOSABS 0.2 11/08/2020 1423    BASOSABS 0.0 08/27/2022 0426    BASOSABS 0.0 11/08/2020 1423      Recent Labs (within last 365 days)  No results for input(s): HGB in the last 8760 hours.     CMP     Labs (Brief)          Component Value Date/Time    NA 142 09/08/2022 1333    K 4.4 09/08/2022 1333    CL 100 09/08/2022 1333    CO2 24 09/08/2022 1333    GLUCOSE 102 (H) 09/08/2022 1333    GLUCOSE 96 08/29/2022 0441    BUN 8 09/08/2022 1333    CREATININE 0.81 09/08/2022 1333    CALCIUM  9.9 09/08/2022 1333    PROT 7.8 08/26/2022 0409    PROT 7.7 11/08/2020 1423    ALBUMIN 4.5 08/26/2022 0409    ALBUMIN 5.0 (H)  11/08/2020 1423    AST 23 08/26/2022 0409    ALT 19 08/26/2022 0409    ALKPHOS 77 08/26/2022 0409    BILITOT 0.7 08/26/2022 0409    BILITOT 0.4 11/08/2020 1423    GFRNONAA >60 08/29/2022 0441    GFRAA 88 11/08/2020 1423          Latest Ref Rng & Units 08/26/2022    4:09 AM 11/08/2020    2:23 PM  Hepatic Function  Total Protein 6.5 - 8.1 g/dL 7.8  7.7   Albumin 3.5 - 5.0 g/dL 4.5  5.0   AST 15 - 41 U/L 23  30   ALT 0 - 44 U/L 19  32   Alk Phosphatase 38 - 126 U/L 77  93   Total Bilirubin 0.3 - 1.2 mg/dL 0.7  0.4       Current Medications:      Current Outpatient Medications (Cardiovascular):    atorvastatin  (LIPITOR) 40 MG tablet, Take 1 tablet (40 mg total) by mouth daily.   CARDIZEM  LA 120 MG 24 hr tablet, Take 120 mg by mouth daily.   digoxin  (LANOXIN ) 0.125 MG tablet, Take 1 tablet (0.125 mg total) by mouth daily.   diltiazem  (CARDIZEM  CD) 120 MG 24 hr capsule, Take 1 capsule (120 mg total) by mouth daily.   flecainide  (TAMBOCOR ) 50 MG tablet, Take 1 tablet (50 mg total) by mouth 2 (two) times daily. May take additional tablet daily as needed for breakthrough atrial fib.   metoprolol  succinate (TOPROL -XL) 25 MG 24 hr tablet, Take 25 mg by mouth daily.   metoprolol  tartrate (LOPRESSOR ) 25 MG tablet, Take 1 tablet (25 mg total) by mouth 2 (two) times daily. May take an additional tablet daily for breakthrough atrial fib [facility] [id number])   propranolol  (INDERAL ) 10 MG tablet, Take 1 tablet (10 mg total) by mouth up to 4 (four) times daily as needed for breakthrough episodes of atrial fibrillation.     Current Outpatient Medications (Analgesics):    HYDROcodone -acetaminophen  (NORCO) 5-325 MG tablet, Take 1 tablet by mouth at bedtime.   Current Outpatient Medications (Hematological):    apixaban  (ELIQUIS ) 5  MG TABS tablet, Take 1 tablet (5 mg total) by mouth 2 (two) times daily.   apixaban  (ELIQUIS ) 5 MG TABS tablet, Take 1 tablet (5 mg total) by mouth 2 (two) times daily.  This is a sample   apixaban  (ELIQUIS ) 5 MG TABS tablet, Take 1 tablet (5 mg total) by mouth 2 (two) times daily.   apixaban  (ELIQUIS ) 5 MG TABS tablet, Take 1 tablet (5 mg total) by mouth 2 (two) times daily.   folic acid  (FOLVITE ) 1 MG tablet, Take 1 tablet (1 mg total) by mouth daily.   Current Outpatient Medications (Other):    amoxicillin  (AMOXIL ) 500 MG capsule, Take 4 capsules (2,000 mg total) by mouth 1 hour prior to dental appointment.   clonazePAM  (KLONOPIN ) 0.5 MG tablet, Take 1 tablet (0.5 mg total) by mouth 2 (two) times daily as needed for anxiety.   hydrOXYzine  (ATARAX ) 10 MG tablet, Take 1 tablet (10 mg total) by mouth 3 (three) times daily as needed.   lamoTRIgine  (LAMICTAL ) 100 MG tablet, Take 1 tablet (100 mg total) by mouth 2 (two) times daily. (NDC [phone])   Na Sulfate-K Sulfate-Mg Sulfate concentrate (SUPREP) 17.5-3.13-1.6 GM/177ML SOLN, Take 1 kit (354 mLs total) by mouth once for 1 dose.   omeprazole (PRILOSEC OTC) 20 MG tablet, Take 20 mg by mouth daily.   polyethylene glycol powder (GLYCOLAX /MIRALAX ) 17 GM/SCOOP powder, Take 17 g by mouth daily. Dissolve 1 capful (17g) in 4-8 ounces of liquid and take by mouth daily.   thiamine  (VITAMIN B-1) 100 MG tablet, Take 1 tablet (100 mg total) by mouth daily.   tirzepatide  (ZEPBOUND ) 5 MG/0.5ML Pen, Inject 5 mg into the skin once a week.   traZODone  (DESYREL ) 100 MG tablet, Take 1 tablet (100 mg total) by mouth at bedtime as needed.   Medical History:      Past Medical History:  Diagnosis Date   Anxiety     Atrial fibrillation (HCC)     Cerebrovascular accident (CVA) (HCC) 08/26/2022   GERD (gastroesophageal reflux disease)     Melanoma of upper arm (HCC)     MVP (mitral valve prolapse)     Primary osteoarthritis of left knee 08/15/2014        Allergies:  Allergies       Allergies  Allergen Reactions   Ivp Dye [Iodinated Contrast Media] Shortness Of Breath      Patient had wheezing and hypoxia after  returning from IV dye CT   Codeine Nausea Only   Erythromycin Nausea Only   Nsaids Other (See Comments)      Gi Bleed   Ibuprofen Nausea Only      Gi bleed        Surgical History:  She  has a past surgical history that includes Replacement total knee; Arthroscopic repair ACL; Melanoma excision; Breast biopsy (Left, 06/23/2013); and Breast excisional biopsy (Left, 07/14/2013). Family History:  Her family history includes Heart attack in her brother; Heart disease in her brother and father; Hyperlipidemia in her brother and father; Hypertension in her brother and father.   REVIEW OF SYSTEMS  : All other systems reviewed and negative except where noted in the History of Present Illness.   PHYSICAL EXAM: BP 110/70 (BP Location: Left Arm, Patient Position: Sitting, Cuff Size: Normal)   Pulse 73   Ht 5' 4 (1.626 m)   Wt 172 lb 2 oz (78.1 kg)   BMI 29.55 kg/m  Physical Exam   MEASUREMENTS: Weight- 169. GENERAL APPEARANCE: Well nourished,  in no apparent distress. HEENT: No cervical lymphadenopathy, unremarkable thyroid, sclerae anicteric, conjunctiva pink. RESPIRATORY: Respiratory effort normal, breath sounds equal bilaterally without rales, rhonchi, or wheezing. CARDIO: Regular rate and rhythm with no murmurs, rubs, or gallops, peripheral pulses intact. ABDOMEN: Soft, non-distended, active bowel sounds in all four quadrants, epigastric discomfort or pain, no rebound, no mass appreciated. RECTAL: Declines. MUSCULOSKELETAL: Full range of motion, normal gait, without edema. SKIN: Dry, intact without rashes or lesions. No jaundice. NEURO: Alert, oriented, no focal deficits. PSYCH: Cooperative, normal mood and affect.       Alan JONELLE Coombs, PA-C 10:53 AM

## 2024-09-13 NOTE — Op Note (Addendum)
 [facility] Patient Name: Kathleen Perkins Procedure Date: 09/13/2024 12:58 PM MRN: [id number] Endoscopist: Norleen SAILOR. Abran , MD, [id number] Age: 63 Referring MD:  Date of Birth: Sep 20, 1961 Gender: Female Account #: [id number] Procedure:                Colonoscopy with cold snare polypectomy x 1; hot                            snare x 2; submucosal injection Indications:              Screening for colorectal malignant neoplasm                            positive Cologuard testing Medicines:                Monitored Anesthesia Care Procedure:                Pre-Anesthesia Assessment:                           - Prior to the procedure, a History and Physical                            was performed, and patient medications and                            allergies were reviewed. The patient's tolerance of                            previous anesthesia was also reviewed. The risks                            and benefits of the procedure and the sedation                            options and risks were discussed with the patient.                            All questions were answered, and informed consent                            was obtained. Prior Anticoagulants: The patient has                            taken Eliquis  (apixaban ), last dose was 2 days                            prior to procedure. ASA Grade Assessment: III - A                            patient with severe systemic disease. After                            reviewing the risks and benefits, the patient was  deemed in satisfactory condition to undergo the                            procedure.                           After obtaining informed consent, the colonoscope                            was passed under direct vision. Throughout the                            procedure, the patient's blood pressure, pulse, and                            oxygen saturations were monitored  continuously. The                            CF [id number] #[id number] was introduced through the anus                            and advanced to the the cecum, identified by                            appendiceal orifice and ileocecal valve. The                            terminal ileum, ileocecal valve, appendiceal                            orifice, and rectum were photographed. The quality                            of the bowel preparation was excellent. The                            colonoscopy was performed without difficulty. The                            patient tolerated the procedure well. The bowel                            preparation used was Miralax  via split dose                            instruction. Scope In: 1:15:57 PM Scope Out: 1:37:27 PM Scope Withdrawal Time: 0 hours 17 minutes 59 seconds  Total Procedure Duration: 0 hours 21 minutes 30 seconds  Findings:                 A 2 mm polyp was found in the ascending colon. The                            polyp was removed with a cold snare. Resection and  retrieval were complete.                           Two pedunculated polyps were found in the sigmoid                            colon. The polyps were 20 mm in size. These polyps                            were removed with a cold snare. Resection and                            retrieval were complete. 1 polyp was located at 25                            cm from the anal verge and the second polyp was                            located at 28 cm from the anal verge. After                            resection, both polypectomy sites were marked with                            tattoo ink injected submucosally                           Scattered diverticula were found in the left colon.                            Small internal hemorrhoids.                           The exam was otherwise without abnormality on                            direct and  retroflexion views. The terminal ileum                            was normal. Complications:            No immediate complications. Estimated blood loss:                            None. Estimated Blood Loss:     Estimated blood loss: none. Impression:               - One 2 mm polyp in the ascending colon, removed                            with a cold snare. Resected and retrieved.                           - Two 20 mm polyps in the sigmoid colon, removed  with a cold snare. Resected and retrieved. Tattooed                           - Diverticulosis in the left colon. Small internal                            hemorrhoids                           - The examination was otherwise normal on direct                            and retroflexion views. The terminal ileum was                            normal. Recommendation:           - Repeat colonoscopy in 3 years for surveillance.                           - Patient has a contact number available for                            emergencies. The signs and symptoms of potential                            delayed complications were discussed with the                            patient. Return to normal activities tomorrow.                            Written discharge instructions were provided to the                            patient.                           - Resume previous diet.                           - Continue present medications.                           - Await pathology results.                           - Hold Eliquis  for 48 hours, then resume Jhett Fretwell N. Abran, MD 09/13/2024 1:48:03 PM This report has been signed electronically.

## 2024-09-14 ENCOUNTER — Telehealth: Payer: Self-pay | Admitting: Family Medicine

## 2024-09-14 ENCOUNTER — Telehealth: Payer: Self-pay

## 2024-09-14 ENCOUNTER — Other Ambulatory Visit (HOSPITAL_COMMUNITY): Payer: Self-pay

## 2024-09-14 NOTE — Telephone Encounter (Signed)
 1.  She has a high-grade proximal esophageal tumor.  Biopsied. 2.  For any significant cardiopulmonary symptoms, proceed to ER immediately.

## 2024-09-14 NOTE — Telephone Encounter (Signed)
 Pt wants to keep her appointment for 09/15/2024 at 240pm

## 2024-09-14 NOTE — Telephone Encounter (Signed)
  Follow up Call-     09/13/2024   12:34 PM  Call back number  Post procedure Call Back phone  # [phone]  Permission to leave phone message Yes     Patient questions:  Do you have a fever, pain , or abdominal swelling? Yes.   Pain Score  4 * in throat  Have you tolerated food without any problems? No.  Have you been able to return to your normal activities? Yes.    Do you have any questions about your discharge instructions: Diet   No. Medications  No. Follow up visit  No.  Do you have questions or concerns about your Care? Yes.    Upon answering phone pt said she was feeling terrible. Pt reported feeling SOB, faint and like she was in AFIB. This RN strongly advised pt to go to the ER for evaluation. Pt said she understood recommendation but felt that she was just in AFIB. Pt wanted to take her medication and wait for symptoms to resolve. This RN repeated recommendation to seek care in the ER and pt stated that she understood recommendation. Pt also reported difficulty swallowing due to mass in throat, and a small amount of residual blood in her stool after procedure.   Actions: * If pain score is 4 or above: Physician/ provider Notified : Norleen Kiang, MD.

## 2024-09-14 NOTE — Telephone Encounter (Signed)
 Copied from CRM [id number]. Topic: Appointments - Scheduling Inquiry for Clinic >> Sep 14, 2024 12:05 PM Kathleen Perkins wrote: Reason for CRM: Patient have a appointment for 09/15/24 at 2:40pm- have just been diginose with throat cancer and started a new job- must work half a day and have another appointment scheduled for 09/15/24 at 3:40pm.   The patient is asking if she can be seen at a later time for 09/15/24 to keep both appointments- offered to reschedule to next available appointment in November- patient refused.   Please call [phone] to discuss

## 2024-09-14 NOTE — Telephone Encounter (Signed)
 Pharmacy Patient Advocate Encounter  Received notification from [employer] that Prior Authorization for ZEPBOUND  5 MG/0.5 ML PEN has been APPROVED from 09/13/2024 to 09/12/2025. Ran test claim, Copay is $24.99. This test claim was processed through [facility]- copay amounts may vary at other pharmacies due to pharmacy/plan contracts, or as the patient moves through the different stages of their insurance plan.   PA #/Case ID/Reference #: [id number]

## 2024-09-14 NOTE — Telephone Encounter (Signed)
 error

## 2024-09-15 ENCOUNTER — Encounter: Payer: Self-pay | Admitting: Family Medicine

## 2024-09-15 ENCOUNTER — Ambulatory Visit
Admission: RE | Admit: 2024-09-15 | Discharge: 2024-09-15 | Disposition: A | Discharge status: Home / Self Care | Source: Ambulatory Visit | Attending: Internal Medicine | Admitting: Internal Medicine

## 2024-09-15 ENCOUNTER — Ambulatory Visit: Admitting: Family Medicine

## 2024-09-15 ENCOUNTER — Ambulatory Visit (HOSPITAL_BASED_OUTPATIENT_CLINIC_OR_DEPARTMENT_OTHER)

## 2024-09-15 ENCOUNTER — Ambulatory Visit (INDEPENDENT_AMBULATORY_CARE_PROVIDER_SITE_OTHER): Admitting: Family Medicine

## 2024-09-15 ENCOUNTER — Other Ambulatory Visit

## 2024-09-15 ENCOUNTER — Other Ambulatory Visit: Payer: Self-pay

## 2024-09-15 VITALS — BP 139/82 | HR 61 | Ht 64.0 in | Wt 176.2 lb

## 2024-09-15 DIAGNOSIS — K2289 Other specified disease of esophagus: Secondary | ICD-10-CM | POA: Diagnosis not present

## 2024-09-15 DIAGNOSIS — Z23 Encounter for immunization: Secondary | ICD-10-CM

## 2024-09-15 DIAGNOSIS — R1319 Other dysphagia: Secondary | ICD-10-CM | POA: Diagnosis not present

## 2024-09-15 DIAGNOSIS — R1310 Dysphagia, unspecified: Secondary | ICD-10-CM

## 2024-09-15 DIAGNOSIS — R634 Abnormal weight loss: Secondary | ICD-10-CM | POA: Diagnosis not present

## 2024-09-15 NOTE — Progress Notes (Signed)
 Established patient visit   Patient: Kathleen Perkins   DOB: [birth year]   63 y.o. Female  MRN: [id number] Visit Date: 09/15/2024  Today's healthcare provider: Jon Eva, MD   Chief Complaint  Patient presents with   Follow-up    Patient reports wanting to discuss work and recent imaging findings along with biopsies. She would like to receive influenza if provider thinks it is okay for her to receive today since she s currently drinking barium    Subjective    HPI HPI     Follow-up    Additional comments: Patient reports wanting to discuss work and recent imaging findings along with biopsies. She would like to receive influenza if provider thinks it is okay for her to receive today since she s currently drinking barium       Last edited by Lilian Fitzpatrick, CMA on 09/15/2024  2:49 PM.       Discussed the use of AI scribe software for clinical note transcription with the patient, who gave verbal consent to proceed.  History of Present Illness   Kathleen Perkins is a 63 year old female who presents with difficulty swallowing and pain.  She has experienced significant pain and difficulty swallowing since mid-July, with a sensation of something sticking in her upper esophagus, leading to aspiration and difficulty eating. She is unable to consume solid foods and relies on liquids, yet she is gaining weight. A CT scan and colonoscopy identified two large polyps, but the colonoscopy could not be completed due to esophageal obstruction. She awaits biopsy results and CT findings.  She experiences burning in her lungs and a sensation of throat narrowing, which she attributes to barium consumption. She has a persistent cough and occasional reflux symptoms. She takes over-the-counter Prilosec and breaks medications into smaller pieces to aid swallowing. No history of acid reflux.  Her mother died of lung cancer at a young age, causing concern about a similar fate. She denies  smoking and reports only occasional alcohol consumption. She is currently working but finds it challenging due to health issues and is considering disability insurance. She experiences increased agitation and overstimulation at work, contemplating early retirement and concerned about maintaining health insurance coverage.         Medications: Outpatient Medications Prior to Visit  Medication Sig   amoxicillin  (AMOXIL ) 500 MG capsule Take 4 capsules (2,000 mg total) by mouth 1 hour prior to dental appointment.   apixaban  (ELIQUIS ) 5 MG TABS tablet Take 1 tablet (5 mg total) by mouth 2 (two) times daily.   atorvastatin  (LIPITOR) 80 MG tablet Take 1 tablet (80 mg total) by mouth daily.   clonazePAM  (KLONOPIN ) 0.5 MG tablet Take 1 tablet (0.5 mg total) by mouth 2 (two) times daily as needed for anxiety.   digoxin  (LANOXIN ) 0.125 MG tablet Take 1 tablet (0.125 mg total) by mouth daily.   diltiazem  (CARDIZEM  CD) 120 MG 24 hr capsule Take 1 capsule (120 mg total) by mouth daily.   flecainide  (TAMBOCOR ) 50 MG tablet Take 1 tablet (50 mg total) by mouth 2 (two) times daily. May take additional tablet daily as needed for breakthrough atrial fib.   folic acid  (FOLVITE ) 1 MG tablet Take 1 tablet (1 mg total) by mouth daily.   HYDROcodone -acetaminophen  (NORCO) 5-325 MG tablet Take 1 tablet by mouth at bedtime.   hydrOXYzine  (ATARAX ) 10 MG tablet Take 1 tablet (10 mg total) by mouth 3 (three) times daily as needed.  lamoTRIgine  (LAMICTAL ) 100 MG tablet Take 1 tablet (100 mg total) by mouth 2 (two) times daily. [facility] [phone])   metoprolol  succinate (TOPROL -XL) 25 MG 24 hr tablet Take 25 mg by mouth daily.   metoprolol  tartrate (LOPRESSOR ) 25 MG tablet Take 1 tablet (25 mg total) by mouth 2 (two) times daily. May take an additional tablet daily for breakthrough atrial fib (NDC [address]-99)   omeprazole (PRILOSEC OTC) 20 MG tablet Take 20 mg by mouth daily.   polyethylene glycol powder  (GLYCOLAX /MIRALAX ) 17 GM/SCOOP powder Take 17 g by mouth daily. Dissolve 1 capful (17g) in 4-8 ounces of liquid and take by mouth daily.   propranolol  (INDERAL ) 10 MG tablet Take 1 tablet (10 mg total) by mouth up to 4 (four) times daily as needed for breakthrough episodes of atrial fibrillation.   thiamine  (VITAMIN B-1) 100 MG tablet Take 1 tablet (100 mg total) by mouth daily.   tirzepatide  (ZEPBOUND ) 5 MG/0.5ML Pen Inject 5 mg into the skin once a week.   traZODone  (DESYREL ) 100 MG tablet Take 1 tablet (100 mg total) by mouth at bedtime as needed.   No facility-administered medications prior to visit.    Review of Systems     Objective    BP 139/82 (BP Location: Right Arm, Patient Position: Sitting, Cuff Size: Normal)   Pulse 61   Ht 5' 4 (1.626 m)   Wt 176 lb 3.2 oz (79.9 kg)   SpO2 100%   BMI 30.24 kg/m    Physical Exam Vitals reviewed.  Constitutional:      General: She is not in acute distress.    Appearance: Normal appearance. She is well-developed. She is not diaphoretic.  HENT:     Head: Normocephalic and atraumatic.  Eyes:     General: No scleral icterus.    Conjunctiva/sclera: Conjunctivae normal.  Neck:     Thyroid: No thyromegaly.  Cardiovascular:     Rate and Rhythm: Normal rate and regular rhythm.     Heart sounds: Normal heart sounds. No murmur heard. Pulmonary:     Effort: Pulmonary effort is normal. No respiratory distress.     Breath sounds: Normal breath sounds. No wheezing, rhonchi or rales.  Musculoskeletal:     Cervical back: Neck supple.     Right lower leg: No edema.     Left lower leg: No edema.  Lymphadenopathy:     Cervical: No cervical adenopathy.  Skin:    General: Skin is warm and dry.  Neurological:     Mental Status: She is alert and oriented to person, place, and time. Mental status is at baseline.  Psychiatric:        Mood and Affect: Affect is tearful.        Behavior: Behavior normal.      No results found for any visits  on 09/15/24.  Assessment & Plan     Problem List Items Addressed This Visit   None Visit Diagnoses       Esophageal mass    -  Primary     Esophageal dysphagia         Weight loss         Immunization due       Relevant Orders   Pneumococcal conjugate vaccine 20-valent (Prevnar 20) (Completed)   Flu vaccine trivalent PF, 6mos and older(Flulaval,Afluria,Fluarix,Fluzone) (Completed)           Suspected esophageal tumor Suspected esophageal tumor causing significant dysphagia and aspiration risk. Symptoms include difficulty swallowing  since mid-July, with recent aspiration. Awaiting biopsy and CT results to determine the extent and nature of the tumor. Discussed potential need for oncology referral and treatment options, including radiation and chemotherapy, depending on biopsy and CT findings. She expresses reluctance towards chemotherapy but is open to further discussion once results are available. - Await biopsy and CT results to determine tumor characteristics and extent - Consider referral to oncology/rad onc for further management based on results - Encourage liquid nutrition, such as protein shakes, to maintain nutritional status  Colonic polyps Recent removal of two large colonic polyps. Awaiting biopsy results to determine pathology. CT scan ordered to assess for additional lesions beyond the colonoscopy's reach. - Await biopsy results of colonic polyps - Review CT scan results for further assessment  Constipation Constipation following recent barium study. Reports abdominal discomfort and bloating. - Recommend Miralax  for relief of constipation post-barium study  Bipolar disorder Reports of increased agitation and overstimulation, particularly in work environment. Discussed the impact of current health issues on mental health.  Immunization Influenza and pneumococcal vaccines administered during the visit. Discussed the importance of these vaccines, especially in light  of potential immunosuppressive treatments in the future. - Administer influenza and pneumococcal vaccines - Pt will Consider shingles vaccine if chemotherapy is initiated       I personally spent a total of 31 minutes in the care of the patient today including preparing to see the patient, getting/reviewing separately obtained history, performing a medically appropriate exam/evaluation, counseling and educating, documenting clinical information in the EHR, and coordinating care.   Return if symptoms worsen or fail to improve.       Jon Eva, MD  [facility] [phone] (phone) [phone] (fax)  [facility]

## 2024-09-16 ENCOUNTER — Telehealth: Payer: Self-pay | Admitting: Internal Medicine

## 2024-09-16 DIAGNOSIS — K2289 Other specified disease of esophagus: Secondary | ICD-10-CM

## 2024-09-16 DIAGNOSIS — R1310 Dysphagia, unspecified: Secondary | ICD-10-CM

## 2024-09-16 DIAGNOSIS — C159 Malignant neoplasm of esophagus, unspecified: Secondary | ICD-10-CM

## 2024-09-16 NOTE — Telephone Encounter (Signed)
 Pt calling for biopsy and ct results. Called rad reading room and they have escalated the read to stat. Pt very anxious with the weekend coming. Path states collected but no result in epic.

## 2024-09-16 NOTE — Telephone Encounter (Signed)
 Noted  Thanks Bonita Quin

## 2024-09-16 NOTE — Telephone Encounter (Signed)
 Inbound call from patient requesting a call to discuss CT results and endoscopy pathology results. Advised patient results and pathology have not yet been released. Patient then stated we do have results but have not been review. Patient is requesting a call from [place] to discuss this further. Please advise, thank you

## 2024-09-16 NOTE — Telephone Encounter (Signed)
 Call pathology. This should be read stat.  Thanks

## 2024-09-16 NOTE — Telephone Encounter (Signed)
 Spoke with [facility] pathology who state the specimen was not marked stat. The specimen is still in process of being read, she is going to mark it for stat read.

## 2024-09-17 ENCOUNTER — Ambulatory Visit: Payer: Self-pay | Admitting: Internal Medicine

## 2024-09-19 NOTE — Telephone Encounter (Signed)
 Kathleen Perkins, 1.  Please let the patient know that it does show cancer (this was not unexpected).  Pathology is doing further studies to ascertain the type. 2.  Get her an appointment with oncology ASAP 3.  Agree, any problems with breathing or shortness of breath, go to ER. 4.  Set her up for a barium swallow (no tablet) ASAP.  Proximal esophageal cancer, evaluate the esophagus Thanks, Dr. Abran

## 2024-09-19 NOTE — Telephone Encounter (Signed)
 Inbound call from patient requesting to speak to [facility]. Patient is requesting a call back. Please advise.

## 2024-09-19 NOTE — Addendum Note (Signed)
 Addended by: CLAUDENE NAOMIE SAILOR on: 09/19/2024 04:18 PM   Modules accepted: Orders

## 2024-09-19 NOTE — Telephone Encounter (Signed)
 Inbound call from patient requesting a call to discuss if endoscopy biopsy results are back yet. Patient does not wish to speak with Rock but would like to speak with Dottie. Please advise, thank you

## 2024-09-19 NOTE — Telephone Encounter (Addendum)
 I have spoken to patient to confirm that pathology has indicated she does have an esophageal cancer. Stains have currently been ordered to differentiate the type of cancer this is. I have advised patient that we have placed an urgent referral to oncology so she should hear from them within the next couple of days regarding an appointment. I have also advised that Dr Abran recommends barium esophagram to be completed ASAP and this has been ordered for tomorrow, 09/20/24 at 1130 am, 1115 am arrival at [facility]. Patient verbalizes understanding of this information and verbalizes agreement with plan.

## 2024-09-19 NOTE — Telephone Encounter (Signed)
 Patient has been scheduled for barium swallow WITHOUT tablet on tomorrow, 09/20/24 at 1130 am, 1115 am arrival.  I have left a voicemail for patient to call back.   Urgent referral made to oncology via EPIC.

## 2024-09-19 NOTE — Telephone Encounter (Signed)
 I have spoken to patient to advise that we have not yet received results of pathology but have requested expedited review of her case. Advised I contacted pathology this morning again about this. Patient is understandably extremely anxious about diagnosis and treatment moving forward. I assured her we would continue frequent chart review for results. Patient states that she does feel like the lesion is getting larger and has been experiencing some shortness of breath lately when laying down. I have advised her that if she is experiencing any additional shortness of breath, chest pain than she should seek care in the ER for expedited care. She verbalizes understanding of this information.  Upon hanging up with patient, I got a phone call from Dr LeGolvan. He states that he is not signing off on the case today as he has sent some stains off that will not likely result until tomorrow. 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

## 2024-09-19 NOTE — Telephone Encounter (Signed)
 I have spoken to [facility] with [facility]. She states that the pathology is still in process but she will send an email to Dr LeGolvan, pathologist who has the case to advise that office is reaching out about this.   I have left a voicemail for patient to call back.

## 2024-09-20 ENCOUNTER — Ambulatory Visit: Payer: Self-pay | Admitting: Internal Medicine

## 2024-09-20 ENCOUNTER — Ambulatory Visit
Admission: RE | Admit: 2024-09-20 | Discharge: 2024-09-20 | Disposition: A | Discharge status: Home / Self Care | Source: Ambulatory Visit | Attending: Internal Medicine | Admitting: Internal Medicine

## 2024-09-20 ENCOUNTER — Telehealth: Payer: Self-pay | Admitting: Internal Medicine

## 2024-09-20 ENCOUNTER — Telehealth: Payer: Self-pay

## 2024-09-20 DIAGNOSIS — K2289 Other specified disease of esophagus: Secondary | ICD-10-CM | POA: Diagnosis present

## 2024-09-20 DIAGNOSIS — R1310 Dysphagia, unspecified: Secondary | ICD-10-CM | POA: Insufficient documentation

## 2024-09-20 LAB — SURGICAL PATHOLOGY

## 2024-09-20 NOTE — Telephone Encounter (Signed)
 Inbound call from patient stating she just wanted to let you know she went and had the barium esophagram completed.  Please advise  Thank you

## 2024-09-20 NOTE — Telephone Encounter (Signed)
 Copied from CRM #[id number]. Topic: General - Other >> Sep 20, 2024  9:23 AM Antony RAMAN wrote: Reason for CRM: pt is calling requesting that she has a note for her employer verifying she was in and got her flu and pneumonia shot, she will pick up around lunch

## 2024-09-20 NOTE — Telephone Encounter (Signed)
 Pt made aware. Immunization records and work note can both be sent via Clinical cytogeneticist. She reports she would like paper copies. Immunization records and work note printed and placed at the front desk for patient to pick up. Advised the office will close at 12 for lunch.

## 2024-09-20 NOTE — Telephone Encounter (Signed)
 Noted. Results available for Dr Nancyann review in [facility]. Will reach back out to patient with any additional recommendations Dr Abran may have as directed.

## 2024-09-21 ENCOUNTER — Other Ambulatory Visit: Payer: Self-pay

## 2024-09-21 ENCOUNTER — Telehealth: Payer: Self-pay

## 2024-09-21 DIAGNOSIS — C159 Malignant neoplasm of esophagus, unspecified: Secondary | ICD-10-CM

## 2024-09-21 NOTE — Telephone Encounter (Signed)
 Spoke with Kathleen Perkins on the phone. We have arranged radiation oncology appointment in addition to medical oncology. These are scheduled 10/23 at 1330. We are arranging PET scan for staging. Pending insurance authorization and scheduling. Dietician referral placed.

## 2024-09-22 ENCOUNTER — Inpatient Hospital Stay: Admitting: Oncology

## 2024-09-22 ENCOUNTER — Other Ambulatory Visit: Payer: Self-pay

## 2024-09-22 ENCOUNTER — Encounter: Payer: Self-pay | Admitting: Oncology

## 2024-09-22 ENCOUNTER — Ambulatory Visit
Admission: RE | Admit: 2024-09-22 | Discharge: 2024-09-22 | Disposition: A | Discharge status: Home / Self Care | Source: Ambulatory Visit | Attending: Radiation Oncology | Admitting: Radiation Oncology

## 2024-09-22 ENCOUNTER — Encounter: Payer: Self-pay | Admitting: Radiation Oncology

## 2024-09-22 ENCOUNTER — Telehealth: Payer: Self-pay

## 2024-09-22 ENCOUNTER — Inpatient Hospital Stay

## 2024-09-22 ENCOUNTER — Inpatient Hospital Stay: Attending: Oncology | Admitting: Oncology

## 2024-09-22 VITALS — BP 147/115 | Temp 97.7°F | Resp 16 | Wt 170.9 lb

## 2024-09-22 VITALS — Resp 16 | Wt 170.9 lb

## 2024-09-22 DIAGNOSIS — R1319 Other dysphagia: Secondary | ICD-10-CM

## 2024-09-22 DIAGNOSIS — C159 Malignant neoplasm of esophagus, unspecified: Secondary | ICD-10-CM

## 2024-09-22 DIAGNOSIS — Z7901 Long term (current) use of anticoagulants: Secondary | ICD-10-CM | POA: Insufficient documentation

## 2024-09-22 DIAGNOSIS — R634 Abnormal weight loss: Secondary | ICD-10-CM | POA: Insufficient documentation

## 2024-09-22 DIAGNOSIS — Z79899 Other long term (current) drug therapy: Secondary | ICD-10-CM | POA: Insufficient documentation

## 2024-09-22 DIAGNOSIS — I4891 Unspecified atrial fibrillation: Secondary | ICD-10-CM | POA: Diagnosis not present

## 2024-09-22 DIAGNOSIS — I341 Nonrheumatic mitral (valve) prolapse: Secondary | ICD-10-CM | POA: Diagnosis not present

## 2024-09-22 DIAGNOSIS — R1310 Dysphagia, unspecified: Secondary | ICD-10-CM | POA: Insufficient documentation

## 2024-09-22 DIAGNOSIS — Z8673 Personal history of transient ischemic attack (TIA), and cerebral infarction without residual deficits: Secondary | ICD-10-CM | POA: Insufficient documentation

## 2024-09-22 DIAGNOSIS — C155 Malignant neoplasm of lower third of esophagus: Secondary | ICD-10-CM | POA: Diagnosis not present

## 2024-09-22 DIAGNOSIS — R079 Chest pain, unspecified: Secondary | ICD-10-CM | POA: Insufficient documentation

## 2024-09-22 DIAGNOSIS — K219 Gastro-esophageal reflux disease without esophagitis: Secondary | ICD-10-CM | POA: Insufficient documentation

## 2024-09-22 DIAGNOSIS — C153 Malignant neoplasm of upper third of esophagus: Secondary | ICD-10-CM | POA: Diagnosis present

## 2024-09-22 DIAGNOSIS — Z85820 Personal history of malignant melanoma of skin: Secondary | ICD-10-CM | POA: Insufficient documentation

## 2024-09-22 DIAGNOSIS — R911 Solitary pulmonary nodule: Secondary | ICD-10-CM | POA: Diagnosis not present

## 2024-09-22 MED ORDER — ONDANSETRON HCL 8 MG PO TABS: 8.0000 mg | ORAL_TABLET | Freq: Three times a day (TID) | ORAL | 1 refills | Status: DC | PRN

## 2024-09-22 MED ORDER — PROCHLORPERAZINE MALEATE 10 MG PO TABS: 10.0000 mg | ORAL_TABLET | Freq: Four times a day (QID) | ORAL | 1 refills | Status: DC | PRN

## 2024-09-22 NOTE — Assessment & Plan Note (Signed)
 Refer to nutritionist.  We discussed about possibility of PEG tube.

## 2024-09-22 NOTE — Progress Notes (Signed)
 Hematology/Oncology Consult Note [phone] [phone]     REFERRING PROVIDER: Abran Norleen SAILOR, MD    CHIEF COMPLAINTS/PURPOSE OF CONSULTATION:  Esophageal squamous cell carcinoma.   ASSESSMENT & PLAN:   Primary esophageal squamous cell carcinoma (HCC) Proximal Esophageal squamous cell carcinoma-linear density below cricopharyngeus Agreed with PET scan to complete staging.  CT scan shows no distant metastasis. I recommend definitive concurrent chemotherapy with carboplatin and Taxol with radiation. Surgery of proximal esophageal cancer of high risk and it will be reserved for patient with residual disease after finishing chemotherapy radiation.  Referred to thoracic surgery for further evaluation.  I explained to the patient the risks and benefits of chemotherapy including all but not limited to infusion reaction, hair loss, hearing loss, mouth sore, nausea, vomiting, low blood counts, bleeding, heart failure, kidney failure and risk of life threatening infection and even death, secondary malignancy etc.   Patient voices understanding and willing to proceed chemotherapy.   # Chemotherapy education; Antiemetics-Zofran  and Compazine; EMLA cream sent to pharmacy Supportive care measures are necessary for patient well-being and will be provided as necessary. We spent sufficient time to discuss many aspect of care, questions were answered to patient's satisfaction.   Dysphagia Refer to nutritionist.  We discussed about possibility of PEG tube.   Orders Placed This Encounter  Procedures   Consent Attestation for Oncology Treatment    The patient is informed of risks, benefits, side-effects of the prescribed oncology treatment. Potential short term and long term side effects and response rates discussed. After a long discussion, the patient made informed decision to proceed.:   Yes    All questions were answered. The patient knows to call the clinic with any  problems, questions or concerns.  Zelphia Cap, MD, PhD [facility] Hematology Oncology 09/22/2024    HISTORY OF PRESENTING ILLNESS:  Kathleen Perkins 63 y.o. female presents to establish care for Esophageal squamous cell carcinoma.   Discussed the use of AI scribe software for clinical note transcription with the patient, who gave verbal consent to proceed.   I have reviewed her chart and materials related to her cancer extensively and collaborated history with the patient. Summary of oncologic history is as follows: Oncology History  Primary esophageal squamous cell carcinoma (HCC)  09/17/2024 Imaging   CT chest abdomen pelvis wo contrast  1. Circumferential wall thickening in the proximal esophagus, compatible with the patient's known esophageal lesion. 2. No evidence for metastatic disease in the chest, abdomen, or pelvis. 3. 3 mm noncalcified left upper lobe pulmonary nodule. Likely benign, continued attention on follow-up suggested. 4. Apparent asymmetric wall thickening in the sigmoid colon is probably stool given no lesion at this location on colonoscopy of 09/13/2024. 5.  Aortic Atherosclerosis    09/22/2024 Initial Diagnosis   Primary esophageal squamous cell carcinoma (HCC)  In early to mid-July 2025 , she began experiencing difficulty swallowing, initially noticed while eating lasagna. An episode of aspiration prompted her to seek medical attention. She has no history of GERD or heartburn. She has been experiencing progressive difficulty swallowing both liquids and solids, with a sensation of burning and irritation. She has started using a Nutribullet for nutrition and is considering protein shakes  09/13/2024 EGD showed malignant appearing high-grade stricture immediately below the cricopharyngeus this would not permit the passage of the standard endoscopy beyond.  Biopsies were taken.  Stomach was unable to be examined. Biopsy pathology showed Surgical [P], proximal  esophagus mass :       -  INVASIVE POORLY DIFFERENTIATED CARCINOMA IMMUNOHISTOCHEMICALLY CONSISTENT  WITH SQUAMOUS CELL CARCINOMA (CK5/6 AND P63 POSITIVE; CK7 AND CK20 NEGATIVE)     09/22/2024 Cancer Staging   Staging form: Esophagus - Squamous Cell Carcinoma, AJCC 8th Edition - Clinical stage from 09/22/2024: Stage Unknown (cTX, cN0, cM0) - Signed by Babara Call, MD on 09/22/2024 Stage prefix: Initial diagnosis Total positive nodes: 0     She has a history of melanoma and a family history of lung cancer. She does not smoke or drink alcohol regularly, though she occasionally drinks beer and smoked marijuana in college.    MEDICAL HISTORY:  Past Medical History:  Diagnosis Date   Anxiety    Atrial fibrillation (HCC)    Cerebrovascular accident (CVA) (HCC) 08/26/2022   GERD (gastroesophageal reflux disease)    Melanoma of upper arm (HCC)    MVP (mitral valve prolapse)    Primary osteoarthritis of left knee 08/15/2014    SURGICAL HISTORY: Past Surgical History:  Procedure Laterality Date   ARTHROSCOPIC REPAIR ACL     BREAST BIOPSY Left 06/23/2013   u/s bx- neg   BREAST EXCISIONAL BIOPSY Left 07/14/2013   us /bx-neg   MELANOMA EXCISION     left arm.    REPLACEMENT TOTAL KNEE     left     SOCIAL HISTORY: Social History   Socioeconomic History   Marital status: Divorced    Spouse name: Not on file   Number of children: Not on file   Years of education: Not on file   Highest education level: Not on file  Occupational History   Not on file  Tobacco Use   Smoking status: Never   Smokeless tobacco: Never  Vaping Use   Vaping status: Never Used  Substance and Sexual Activity   Alcohol use: No   Drug use: No   Sexual activity: Not on file  Other Topics Concern   Not on file  Social History Narrative   Not on file   Social Drivers of Health   Financial Resource Strain: Not on file  Food Insecurity: No Food Insecurity (09/22/2024)   Hunger Vital Sign    Worried  About Running Out of Food in the Last Year: Never true    Ran Out of Food in the Last Year: Never true  Transportation Needs: No Transportation Needs (09/22/2024)   PRAPARE - Administrator, Civil Service (Medical): No    Lack of Transportation (Non-Medical): No  Physical Activity: Not on file  Stress: Not on file  Social Connections: Not on file  Intimate Partner Violence: Not At Risk (09/22/2024)   Humiliation, Afraid, Rape, and Kick questionnaire    Fear of Current or Ex-Partner: No    Emotionally Abused: No    Physically Abused: No    Sexually Abused: No    FAMILY HISTORY: Family History  Problem Relation Age of Onset   Heart disease Father        CABG x 4    Hypertension Father    Hyperlipidemia Father    Heart disease Brother        CABG x 4    Hyperlipidemia Brother    Hypertension Brother    Heart attack Brother    Breast cancer Neg Hx    Colon cancer Neg Hx    Esophageal cancer Neg Hx    Rectal cancer Neg Hx    Stomach cancer Neg Hx     ALLERGIES:  is allergic to ivp dye [iodinated contrast media],  codeine, erythromycin, ibuprofen, and nsaids.  MEDICATIONS:  Current Outpatient Medications  Medication Sig Dispense Refill   apixaban  (ELIQUIS ) 5 MG TABS tablet Take 1 tablet (5 mg total) by mouth 2 (two) times daily. 180 tablet 3   clonazePAM  (KLONOPIN ) 0.5 MG tablet Take 1 tablet (0.5 mg total) by mouth 2 (two) times daily as needed for anxiety. 30 tablet 1   diltiazem  (CARDIZEM  CD) 120 MG 24 hr capsule Take 1 capsule (120 mg total) by mouth daily. 90 capsule 1   flecainide  (TAMBOCOR ) 50 MG tablet Take 1 tablet (50 mg total) by mouth 2 (two) times daily. May take additional tablet daily as needed for breakthrough atrial fib. 225 tablet 5   HYDROcodone -acetaminophen  (NORCO) 5-325 MG tablet Take 1 tablet by mouth at bedtime. 20 tablet 0   hydrOXYzine  (ATARAX ) 10 MG tablet Take 1 tablet (10 mg total) by mouth 3 (three) times daily as needed. 30 tablet 3    lamoTRIgine  (LAMICTAL ) 100 MG tablet Take 1 tablet (100 mg total) by mouth 2 (two) times daily. (NDC [phone]) 180 tablet 1   metoprolol  tartrate (LOPRESSOR ) 25 MG tablet Take 1 tablet (25 mg total) by mouth 2 (two) times daily. May take an additional tablet daily for breakthrough atrial fib [facility] [id number]) 180 tablet 1   omeprazole (PRILOSEC OTC) 20 MG tablet Take 20 mg by mouth daily.     polyethylene glycol powder (GLYCOLAX /MIRALAX ) 17 GM/SCOOP powder Take 17 g by mouth daily. Dissolve 1 capful (17g) in 4-8 ounces of liquid and take by mouth daily. 238 g 3   propranolol  (INDERAL ) 10 MG tablet Take 1 tablet (10 mg total) by mouth up to 4 (four) times daily as needed for breakthrough episodes of atrial fibrillation. 120 tablet 5   traZODone  (DESYREL ) 100 MG tablet Take 1 tablet (100 mg total) by mouth at bedtime as needed. 90 tablet 1   amoxicillin  (AMOXIL ) 500 MG capsule Take 4 capsules (2,000 mg total) by mouth 1 hour prior to dental appointment. (Patient not taking: Reported on 09/22/2024) 12 capsule 0   thiamine  (VITAMIN B-1) 100 MG tablet Take 1 tablet (100 mg total) by mouth daily. (Patient not taking: Reported on 09/22/2024)     No current facility-administered medications for this visit.    Review of Systems  Constitutional:  Negative for appetite change, chills, fatigue and fever.  HENT:   Positive for trouble swallowing. Negative for hearing loss and voice change.   Eyes:  Negative for eye problems.  Respiratory:  Negative for chest tightness and cough.   Cardiovascular:  Negative for chest pain.  Gastrointestinal:  Negative for abdominal distention, abdominal pain and blood in stool.  Endocrine: Negative for hot flashes.  Genitourinary:  Negative for difficulty urinating and frequency.   Musculoskeletal:  Negative for arthralgias.  Skin:  Negative for itching and rash.  Neurological:  Negative for extremity weakness.  Hematological:  Negative for adenopathy.   Psychiatric/Behavioral:  Negative for confusion.      PHYSICAL EXAMINATION: ECOG PERFORMANCE STATUS: 0 - Asymptomatic  Vitals:   09/22/24 1358  BP: (!) 147/115  Resp: 16  Temp: 97.7 F (36.5 C)   Filed Weights   09/22/24 1358  Weight: 170 lb 14.4 oz (77.5 kg)    Physical Exam Constitutional:      General: She is not in acute distress.    Appearance: She is not diaphoretic.  HENT:     Head: Normocephalic and atraumatic.     Nose: Nose normal.  Mouth/Throat:     Pharynx: No oropharyngeal exudate.  Eyes:     General: No scleral icterus.    Pupils: Pupils are equal, round, and reactive to light.  Cardiovascular:     Rate and Rhythm: Normal rate and regular rhythm.     Heart sounds: No murmur heard. Pulmonary:     Effort: Pulmonary effort is normal. No respiratory distress.     Breath sounds: No rales.  Chest:     Chest wall: No tenderness.  Abdominal:     General: There is no distension.     Palpations: Abdomen is soft.     Tenderness: There is no abdominal tenderness.  Musculoskeletal:        General: Normal range of motion.     Cervical back: Normal range of motion and neck supple.  Skin:    General: Skin is warm and dry.     Findings: No erythema.  Neurological:     Mental Status: She is alert and oriented to person, place, and time.     Cranial Nerves: No cranial nerve deficit.     Motor: No abnormal muscle tone.     Coordination: Coordination normal.  Psychiatric:        Mood and Affect: Affect normal.      LABORATORY DATA:  I have reviewed the data as listed    Latest Ref Rng & Units 09/07/2024    4:27 PM 09/08/2022    1:33 PM 08/29/2022    4:41 AM  CBC  WBC 3.4 - 10.8 x10E3/uL 8.4  8.2  7.9   Hemoglobin 11.1 - 15.9 g/dL 86.9  85.8  85.3   Hematocrit 34.0 - 46.6 % 38.9  40.1  43.3   Platelets 150 - 450 x10E3/uL 528  337  364       Latest Ref Rng & Units 09/07/2024    4:27 PM 09/08/2022    1:33 PM 08/29/2022    4:41 AM  CMP  Glucose 70 -  99 mg/dL 886  897  96   BUN 8 - 27 mg/dL 7  8  12    Creatinine 0.57 - 1.00 mg/dL 8.82  9.18  9.13   Sodium 134 - 144 mmol/L 138  142  137   Potassium 3.5 - 5.2 mmol/L 4.3  4.4  3.8   Chloride 96 - 106 mmol/L 98  100  102   CO2 20 - 29 mmol/L 22  24  27    Calcium  8.7 - 10.3 mg/dL 89.8  9.9  8.8   Total Protein 6.0 - 8.5 g/dL 7.3     Total Bilirubin 0.0 - 1.2 mg/dL 0.3     Alkaline Phos 49 - 135 IU/L 125     AST 0 - 40 IU/L 20     ALT 0 - 32 IU/L 14        RADIOGRAPHIC STUDIES: I have personally reviewed the radiological images as listed and agreed with the findings in the report. DG ESOPHAGUS W DOUBLE CM (HD) Result Date: 09/20/2024 CLINICAL DATA:  Patient with progressive dysphagia since July of this year, recent EGD showed a malignant appearing high-grade stricture immediately below the cricopharyngeus with the endoscope unable to pass beyond this point. Biopsies taken during EGD showed squamous carcinoma. Request for esophagram without tablet to evaluate remainder of esophagus. EXAM: ESOPHAGUS/BARIUM SWALLOW/TABLET STUDY TECHNIQUE: Combined double and single contrast examination was performed using effervescent crystals, high-density barium, and thin liquid barium. This exam was performed by Clotilda Hesselbach, PA-C,  and was supervised and interpreted by Marcey Moan, MD. FLUOROSCOPY: Radiation Exposure Index (as provided by the fluoroscopic device): 21.10 mGy Kerma COMPARISON:  CT chest abdomen pelvis 09/15/2024 FINDINGS: Swallowing: Appears normal. No vestibular penetration or aspiration seen. Pharynx: Unremarkable. Esophagus: Initial irregular filling defect with associated stricture is seen at approximately C5 level in the area of previously seen high-grade malignant stricture of the proximal esophagus seen by EGD. Thin and thick liquid barium passed beyond this point without difficulty. Later swallows show mucosal irregularity extending in the proximal esophagus to about the lower T1  level and thoracic inlet, suspicious for additional contiguous segmental tumor extension. Tumor involvement may be as long as 3.5 cm by esophagram. No other filling defects or areas of significant narrowing noted in the remainder of the examined esophagus. Esophageal motility: Within normal limits. Hiatal Hernia: None. Gastroesophageal reflux: No spontaneous gastroesophageal reflux Ingested 13 mm barium tablet: Not given per ordering provider request. Other: None. IMPRESSION: Irregular filling defect and stricture of proximal esophagus extending from roughly the C5 to lower T1 vertebral levels and consistent with the known biopsy proven squamous carcinoma. Tumor involvement may be as long as 3.5 cm by esophagram. Swallowing mechanism is preserved with thin and thick liquid barium showing normal passage through the proximal esophagus. Electronically Signed   By: Marcey Moan M.D.   On: 09/20/2024 13:24   CT CHEST ABDOMEN PELVIS WO CONTRAST Result Date: 09/17/2024 CLINICAL DATA:  Esophageal mass.  Staging.  * Tracking Code: BO * EXAM: CT CHEST, ABDOMEN AND PELVIS WITHOUT CONTRAST TECHNIQUE: Multidetector CT imaging of the chest, abdomen and pelvis was performed following the standard protocol without IV contrast. RADIATION DOSE REDUCTION: This exam was performed according to the departmental dose-optimization program which includes automated exposure control, adjustment of the mA and/or kV according to patient size and/or use of iterative reconstruction technique. COMPARISON:  None Available. FINDINGS: CT CHEST FINDINGS Cardiovascular: The heart size is normal. No substantial pericardial effusion. Mild atherosclerotic calcification is noted in the wall of the thoracic aorta. Mediastinum/Nodes: Small lymph nodes in the thoracic inlet bilaterally do not meet CT size criteria for pathologic enlargement. Circumferential wall thickening noted in the proximal esophagus (image 6/series 2). No mediastinal  lymphadenopathy. No evidence for gross hilar lymphadenopathy although assessment is limited by the lack of intravenous contrast on the current study. The esophagus has normal imaging features. There is no axillary lymphadenopathy. Lungs/Pleura: Calcified granuloma noted right middle lobe. 3 mm noncalcified nodule identified left upper lobe on image 40/3. No focal airspace consolidation. There is no evidence of pleural effusion. Musculoskeletal: No worrisome lytic or sclerotic osseous abnormality. CT ABDOMEN PELVIS FINDINGS Hepatobiliary: No suspicious focal abnormality in the liver on this study without intravenous contrast. There is no evidence for gallstones, gallbladder wall thickening, or pericholecystic fluid. No intrahepatic or extrahepatic biliary dilation. Pancreas: No focal mass lesion. No dilatation of the main duct. No intraparenchymal cyst. No peripancreatic edema. Spleen: No splenomegaly. No suspicious focal mass lesion. Adrenals/Urinary Tract: No adrenal nodule or mass. Kidneys unremarkable. No evidence for hydroureter. The urinary bladder appears normal for the degree of distention. Stomach/Bowel: Stomach is distended with food and contrast material. Duodenum is normally positioned as is the ligament of Treitz. No small bowel wall thickening. No small bowel dilatation. The terminal ileum is normal. The appendix is normal. Apparent asymmetric wall thickening in the sigmoid colon (see axial image 95/2) is probably stool given no lesion at this location on colonoscopy of 09/13/2024. Vascular/Lymphatic: There is mild  atherosclerotic calcification of the abdominal aorta without aneurysm. There is no gastrohepatic or hepatoduodenal ligament lymphadenopathy. No retroperitoneal or mesenteric lymphadenopathy. No pelvic sidewall lymphadenopathy. Reproductive: Calcified uterine fibroid evident. There is no adnexal mass. Other: No intraperitoneal free fluid. Musculoskeletal: No worrisome lytic or sclerotic  osseous abnormality. IMPRESSION: 1. Circumferential wall thickening in the proximal esophagus, compatible with the patient's known esophageal lesion. 2. No evidence for metastatic disease in the chest, abdomen, or pelvis. 3. 3 mm noncalcified left upper lobe pulmonary nodule. Likely benign, continued attention on follow-up suggested. 4. Apparent asymmetric wall thickening in the sigmoid colon is probably stool given no lesion at this location on colonoscopy of 09/13/2024. 5.  Aortic Atherosclerosis (ICD10-I70.0). Electronically Signed   By: Camellia Candle M.D.   On: 09/17/2024 07:17

## 2024-09-22 NOTE — Assessment & Plan Note (Addendum)
 Proximal Esophageal squamous cell carcinoma-linear density below cricopharyngeus Agreed with PET scan to complete staging.  CT scan shows no distant metastasis. I recommend definitive concurrent chemotherapy with carboplatin and Taxol with radiation. Surgery of proximal esophageal cancer of high risk and it will be reserved for patient with residual disease after finishing chemotherapy radiation.  Referred to thoracic surgery for further evaluation.  I explained to the patient the risks and benefits of chemotherapy including all but not limited to infusion reaction, hair loss, hearing loss, mouth sore, nausea, vomiting, low blood counts, bleeding, heart failure, kidney failure and risk of life threatening infection and even death, secondary malignancy etc.   Patient voices understanding and willing to proceed chemotherapy.   # Chemotherapy education; Antiemetics-Zofran  and Compazine; EMLA cream sent to pharmacy Supportive care measures are necessary for patient well-being and will be provided as necessary. We spent sufficient time to discuss many aspect of care, questions were answered to patient's satisfaction.

## 2024-09-22 NOTE — Consult Note (Signed)
 NEW PATIENT EVALUATION  Name: Kathleen Perkins  MRN: [id number]  Date:   09/22/2024     DOB: 11/26/1961   This 63 y.o. 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  REFERRING PHYSICIAN: Myrla Jon HERO, MD  CHIEF COMPLAINT:  Chief Complaint  Patient presents with   Esophageal Cancer    DIAGNOSIS: The encounter diagnosis was Squamous cell carcinoma of esophagus (HCC).   PREVIOUS INVESTIGATIONS:  CT scan reviewed PET CT scan for tomorrow Pathology reports reviewed Clinical notes reviewed  HPI: Patient is a 63 year old female who has over the past 6 months had increasing dysphagia and approximate 10 pound weight loss.  She underwent upper endoscopy showing a malignant appearing high-grade stricture immediately below the cricopharyngeus with that would not permit passage of the standard endoscope.  Biopsies were performed and positive for squamous cell carcinoma CK5/6 and p63 positive.  CT scan of the chest showed circumferential wall thickening in the proximal esophagus compatible with the patient's known esophageal lesion.  No evidence of metastatic disease in chest abdomen or pelvis was noted.  She had a 3 mm left upper lobe pulmonary nodule likely benign.  She is scheduled for PET CT tomorrow.  She is seen today for radiation oncology opinion.  She is having problems with solid foods no matter how small she crushed of the food.  She is also having some upper thorax pain.  PLANNED TREATMENT REGIMEN: Probable  concurrent chemoradiation  PAST MEDICAL HISTORY:  has a past medical history of Anxiety, Atrial fibrillation (HCC), Cerebrovascular accident (CVA) (HCC) (08/26/2022), GERD (gastroesophageal reflux disease), Melanoma of upper arm (HCC), MVP (mitral valve prolapse), and Primary osteoarthritis of left knee (08/15/2014).    PAST SURGICAL HISTORY:  Past Surgical History:  Procedure Laterality  Date   ARTHROSCOPIC REPAIR ACL     BREAST BIOPSY Left 06/23/2013   u/s bx- neg   BREAST EXCISIONAL BIOPSY Left 07/14/2013   us /bx-neg   MELANOMA EXCISION     left arm.    REPLACEMENT TOTAL KNEE     left     FAMILY HISTORY: family history includes Heart attack in her brother; Heart disease in her brother and father; Hyperlipidemia in her brother and father; Hypertension in her brother and father.  SOCIAL HISTORY:  reports that she has never smoked. She has never used smokeless tobacco. She reports that she does not drink alcohol and does not use drugs.  ALLERGIES: Ivp dye [iodinated contrast media], Codeine, Erythromycin, Ibuprofen, and Nsaids  MEDICATIONS:  Current Outpatient Medications  Medication Sig Dispense Refill   amoxicillin  (AMOXIL ) 500 MG capsule Take 4 capsules (2,000 mg total) by mouth 1 hour prior to dental appointment. 12 capsule 0   apixaban  (ELIQUIS ) 5 MG TABS tablet Take 1 tablet (5 mg total) by mouth 2 (two) times daily. 180 tablet 3   atorvastatin  (LIPITOR) 80 MG tablet Take 1 tablet (80 mg total) by mouth daily. 90 tablet 1   clonazePAM  (KLONOPIN ) 0.5 MG tablet Take 1 tablet (0.5 mg total) by mouth 2 (two) times daily as needed for anxiety. 30 tablet 1   digoxin  (LANOXIN ) 0.125 MG tablet Take 1 tablet (0.125 mg total) by mouth daily. 30 tablet 1   diltiazem  (CARDIZEM  CD) 120 MG 24 hr capsule Take 1 capsule (120 mg total) by mouth daily. 90 capsule 1   flecainide  (TAMBOCOR ) 50 MG tablet Take 1 tablet (50 mg total) by mouth 2 (two)  times daily. May take additional tablet daily as needed for breakthrough atrial fib. 225 tablet 5   folic acid  (FOLVITE ) 1 MG tablet Take 1 tablet (1 mg total) by mouth daily.     HYDROcodone -acetaminophen  (NORCO) 5-325 MG tablet Take 1 tablet by mouth at bedtime. 20 tablet 0   hydrOXYzine  (ATARAX ) 10 MG tablet Take 1 tablet (10 mg total) by mouth 3 (three) times daily as needed. 30 tablet 3   lamoTRIgine  (LAMICTAL ) 100 MG tablet Take 1  tablet (100 mg total) by mouth 2 (two) times daily. (NDC [phone]) 180 tablet 1   metoprolol  succinate (TOPROL -XL) 25 MG 24 hr tablet Take 25 mg by mouth daily.     metoprolol  tartrate (LOPRESSOR ) 25 MG tablet Take 1 tablet (25 mg total) by mouth 2 (two) times daily. May take an additional tablet daily for breakthrough atrial fib [facility] [id number]) 180 tablet 1   omeprazole (PRILOSEC OTC) 20 MG tablet Take 20 mg by mouth daily.     polyethylene glycol powder (GLYCOLAX /MIRALAX ) 17 GM/SCOOP powder Take 17 g by mouth daily. Dissolve 1 capful (17g) in 4-8 ounces of liquid and take by mouth daily. 238 g 3   propranolol  (INDERAL ) 10 MG tablet Take 1 tablet (10 mg total) by mouth up to 4 (four) times daily as needed for breakthrough episodes of atrial fibrillation. 120 tablet 5   thiamine  (VITAMIN B-1) 100 MG tablet Take 1 tablet (100 mg total) by mouth daily.     tirzepatide  (ZEPBOUND ) 5 MG/0.5ML Pen Inject 5 mg into the skin once a week. 2 mL 5   traZODone  (DESYREL ) 100 MG tablet Take 1 tablet (100 mg total) by mouth at bedtime as needed. 90 tablet 1   No current facility-administered medications for this encounter.    ECOG PERFORMANCE STATUS:  1 - Symptomatic but completely ambulatory  REVIEW OF SYSTEMS: Patient denies any weight loss, fatigue, weakness, fever, chills or night sweats. Patient denies any loss of vision, blurred vision. Patient denies any ringing  of the ears or hearing loss. No irregular heartbeat. Patient denies heart murmur or history of fainting. Patient denies any chest pain or pain radiating to her upper extremities. Patient denies any shortness of breath, difficulty breathing at night, cough or hemoptysis. Patient denies any swelling in the lower legs. Patient denies any nausea vomiting, vomiting of blood, or coffee ground material in the vomitus. Patient denies any stomach pain. Patient states has had normal bowel movements no significant constipation or diarrhea. Patient  denies any dysuria, hematuria or significant nocturia. Patient denies any problems walking, swelling in the joints or loss of balance. Patient denies any skin changes, loss of hair or loss of weight. Patient denies any excessive worrying or anxiety or significant depression. Patient denies any problems with insomnia. Patient denies excessive thirst, polyuria, polydipsia. Patient denies any swollen glands, patient denies easy bruising or easy bleeding. Patient denies any recent infections, allergies or URI. Patient s visual fields have not changed significantly in recent time.   PHYSICAL EXAM: Resp 16   Wt 170 lb 14.4 oz (77.5 kg)   BMI 29.33 kg/m  Well-developed well-nourished patient in NAD. HEENT reveals PERLA, EOMI, discs not visualized.  Oral cavity is clear. No oral mucosal lesions are identified. Neck is clear without evidence of cervical or supraclavicular adenopathy. Lungs are clear to A&P. Cardiac examination is essentially unremarkable with regular rate and rhythm without murmur rub or thrill. Abdomen is benign with no organomegaly or masses noted. Motor sensory and  DTR levels are equal and symmetric in the upper and lower extremities. Cranial nerves II through XII are grossly intact. Proprioception is intact. No peripheral adenopathy or edema is identified. No motor or sensory levels are noted. Crude visual fields are within normal range.  LABORATORY DATA: Pathology reports reviewed    RADIOLOGY RESULTS: CT scan reviewed PET scan will be reviewed when available   IMPRESSION: Squamous of carcinoma the upper esophagus and 63 year old female  PLAN: At this time like to review her PET/CT.  If this shows no distant disease or local regional disease would offer concurrent chemoradiation therapy with curative intent.  Patient is seeing medical oncology this afternoon.  Risks and benefits of radiation therapy including possible worsening of her dysphagia fatigue alteration of blood counts skin  reaction all were reviewed in detail with the patient.  She comprehends her recommendations well.  I will tentatively set her up for simulation first thing next week.  Will have her PET scan to review at that time and we will have discussed the case with Dr. Babara prior to proceeding with any simulation.  I would like to take this opportunity to thank you for allowing me to participate in the care of your patient.SABRA Marcey Penton, MD

## 2024-09-22 NOTE — Progress Notes (Signed)
 Referral sent for nutrition, social work and cardiothoracic surgery. Provided Journey resource notebook and encouraged to call with any new needs. 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

## 2024-09-22 NOTE — Telephone Encounter (Signed)
 Patient left a voice message today at 11:02am indicating that that she's scheduled for a PET scan next Tuesday and is requesting to have it done tomorrow instead. Patient says she has appointments today and she's afraid to lose her job and her insurance because of all these appointments. Mentioned on Mondays they have morning meetings, that's when one was available Monday and then there was one Tuesday afternoon. If you have anything tomorrow where you can squeek me in, I'm desparate I'm having a hard time swallowing and I need to move forward with this. Mentioned she's going to have to get COBRA I'm not sure I will need help. When I am let go I will get COBRA but I guess I can talk to a Child psychotherapist too. I really need to get that PET scan done tomorrow. I'm coming in at 1:30pm to see radiation oncology and chemo. Best call back number is [phone].   Secure chat sent to [employer] and [place].

## 2024-09-22 NOTE — Progress Notes (Signed)
 START ON PATHWAY REGIMEN - Gastroesophageal     A cycle is every 7 days, concurrent with RT:     Paclitaxel      Carboplatin   **Always confirm dose/schedule in your pharmacy ordering system**  Patient Characteristics: Esophageal & GE Junction, Squamous Cell, Preoperative or Nonsurgical Candidate, M0 (Clinical Staging), cT1, cN0, Unresectable/Nonsurgical Candidate Therapeutic Status: Preoperative or Nonsurgical Candidate, M0 (Clinical Staging) Histology: Squamous Cell Disease Classification: Esophageal AJCC M Category: cM0 AJCC 8 Stage Grouping: Unknown AJCC Grade: G3 AJCC Location: Upper AJCC T Category: cTX AJCC N Category: cN0 Patient Characteristics: Unresectable/Nonsurgical Candidate Intent of Therapy: Curative Intent, Discussed with Patient

## 2024-09-23 ENCOUNTER — Other Ambulatory Visit: Payer: Self-pay

## 2024-09-23 ENCOUNTER — Ambulatory Visit
Admission: RE | Admit: 2024-09-23 | Discharge: 2024-09-23 | Disposition: A | Discharge status: Home / Self Care | Source: Ambulatory Visit | Attending: Oncology | Admitting: Oncology

## 2024-09-23 ENCOUNTER — Telehealth: Payer: Self-pay

## 2024-09-23 ENCOUNTER — Inpatient Hospital Stay: Admitting: Oncology

## 2024-09-23 ENCOUNTER — Other Ambulatory Visit: Payer: Self-pay | Admitting: Thoracic Surgery (Cardiothoracic Vascular Surgery)

## 2024-09-23 DIAGNOSIS — C770 Secondary and unspecified malignant neoplasm of lymph nodes of head, face and neck: Secondary | ICD-10-CM | POA: Insufficient documentation

## 2024-09-23 DIAGNOSIS — I700 Atherosclerosis of aorta: Secondary | ICD-10-CM | POA: Insufficient documentation

## 2024-09-23 DIAGNOSIS — M47816 Spondylosis without myelopathy or radiculopathy, lumbar region: Secondary | ICD-10-CM | POA: Diagnosis not present

## 2024-09-23 DIAGNOSIS — C159 Malignant neoplasm of esophagus, unspecified: Secondary | ICD-10-CM | POA: Diagnosis present

## 2024-09-23 DIAGNOSIS — M51369 Other intervertebral disc degeneration, lumbar region without mention of lumbar back pain or lower extremity pain: Secondary | ICD-10-CM | POA: Insufficient documentation

## 2024-09-23 DIAGNOSIS — I517 Cardiomegaly: Secondary | ICD-10-CM | POA: Diagnosis not present

## 2024-09-23 DIAGNOSIS — M47812 Spondylosis without myelopathy or radiculopathy, cervical region: Secondary | ICD-10-CM | POA: Insufficient documentation

## 2024-09-23 DIAGNOSIS — C158 Malignant neoplasm of overlapping sites of esophagus: Secondary | ICD-10-CM | POA: Insufficient documentation

## 2024-09-23 LAB — GLUCOSE, CAPILLARY: Glucose-Capillary: 97 mg/dL (ref 70–99)

## 2024-09-23 MED ORDER — FLUDEOXYGLUCOSE F - 18 (FDG) INJECTION
9.1800 | Freq: Once | INTRAVENOUS | Status: AC | PRN
  Administered 2024-09-23: 9.18 via INTRAVENOUS

## 2024-09-23 NOTE — Telephone Encounter (Addendum)
 Received call from Kathleen Perkins requesting referral to Dr. Selinda Law at [facility] instead of [facility]. Referral sent and she was instructed to call and cancel her appointment with Dr. Shyrl in [place].

## 2024-09-24 ENCOUNTER — Other Ambulatory Visit: Payer: Self-pay

## 2024-09-24 ENCOUNTER — Other Ambulatory Visit: Payer: Self-pay | Admitting: Family Medicine

## 2024-09-24 ENCOUNTER — Encounter: Payer: Self-pay | Admitting: Oncology

## 2024-09-26 ENCOUNTER — Other Ambulatory Visit: Payer: Self-pay

## 2024-09-26 ENCOUNTER — Ambulatory Visit

## 2024-09-26 ENCOUNTER — Encounter: Payer: Self-pay | Admitting: Oncology

## 2024-09-26 ENCOUNTER — Inpatient Hospital Stay

## 2024-09-26 MED FILL — Hydroxyzine HCl Tab 10 MG: ORAL | 5 days supply | Qty: 17 | Fill #0 | Status: AC

## 2024-09-26 MED FILL — Hydroxyzine HCl Tab 10 MG: ORAL | 5 days supply | Qty: 13 | Fill #0 | Status: AC

## 2024-09-26 NOTE — Telephone Encounter (Signed)
 Requested Prescriptions  Pending Prescriptions Disp Refills   hydrOXYzine  (ATARAX ) 10 MG tablet 30 tablet 3    Sig: Take 1 tablet (10 mg total) by mouth 3 (three) times daily as needed.     Ear, Nose, and Throat:  Antihistamines 2 Failed - 09/26/2024  5:54 PM      Failed - Cr in normal range and within 360 days    Creatinine, Ser  Date Value Ref Range Status  09/07/2024 1.17 (H) 0.57 - 1.00 mg/dL Final         Passed - Valid encounter within last 12 months    Recent Outpatient Visits           1 week ago Esophageal mass   [facility] [place], Jon HERO, MD   1 month ago Esophageal dysphagia   [facility] [place], Jon HERO, MD   8 months ago Encounter for annual physical exam   [facility] [place], Jon HERO, MD

## 2024-09-26 NOTE — Progress Notes (Deleted)
 [address]       [place] [address]             [phone]                    AYLENE ACOFF [facility] Medical Record #[id number] Date of Birth: 1961-03-15  Referring: Myrla Jon HERO, MD Primary Care: Myrla Jon HERO, MD Primary Cardiologist: Aleene Passe, MD (Inactive)  Chief Complaint:   No chief complaint on file.   History of Present Illness:    Kathleen Perkins 63 y.o. female ***  Primary esophageal squamous cell carcinoma (HCC) Proximal Esophageal squamous cell carcinoma-linear density below cricopharyngeus Agreed with PET scan to complete staging.  CT scan shows no distant metastasis. I recommend definitive concurrent chemotherapy with carboplatin and Taxol with radiation. Surgery of proximal esophageal cancer of high risk and it will be reserved for patient with residual disease after finishing chemotherapy radiation.  Referred to thoracic surgery for further evaluation.  I explained to the patient the risks and benefits of chemotherapy including all but not limited to infusion reaction, hair loss, hearing loss, mouth sore, nausea, vomiting, low blood counts, bleeding, heart failure, kidney failure and risk of life threatening infection and even death, secondary malignancy etc.   Patient voices understanding and willing to proceed chemotherapy.    # Chemotherapy education; Antiemetics-Zofran  and Compazine; EMLA cream sent to pharmacy Supportive care measures are necessary for patient well-being and will be provided as necessary. We spent sufficient time to discuss many aspect of care, questions were answered to patient's satisfaction.    Smoking Hx: ***   Zubrod Score: At the time of surgery this patient's most appropriate activity status/level should be described as: []     0    Normal activity, no symptoms []     1    Restricted in physical strenuous activity but ambulatory, able to do out light work []     2    Ambulatory and  capable of self care, unable to do work activities, up and about               >50 % of waking hours                              []     3    Only limited self care, in bed greater than 50% of waking hours []     4    Completely disabled, no self care, confined to bed or chair []     5    Moribund   Past Medical History:  Diagnosis Date   Anxiety    Atrial fibrillation (HCC)    Cerebrovascular accident (CVA) (HCC) 08/26/2022   GERD (gastroesophageal reflux disease)    Melanoma of upper arm (HCC)    MVP (mitral valve prolapse)    Primary osteoarthritis of left knee 08/15/2014    Past Surgical History:  Procedure Laterality Date   ARTHROSCOPIC REPAIR ACL     BREAST BIOPSY Left 06/23/2013   u/s bx- neg   BREAST EXCISIONAL BIOPSY Left 07/14/2013   us /bx-neg   MELANOMA EXCISION     left arm.    REPLACEMENT TOTAL KNEE     left     Family History  Problem Relation Age of Onset   Heart disease Father        CABG x 4    Hypertension Father  Hyperlipidemia Father    Heart disease Brother        CABG x 4    Hyperlipidemia Brother    Hypertension Brother    Heart attack Brother    Breast cancer Neg Hx    Colon cancer Neg Hx    Esophageal cancer Neg Hx    Rectal cancer Neg Hx    Stomach cancer Neg Hx      Social History   Tobacco Use  Smoking Status Never  Smokeless Tobacco Never    Social History   Substance and Sexual Activity  Alcohol Use No     Allergies  Allergen Reactions   Ivp Dye [Iodinated Contrast Media] Shortness Of Breath    Patient had wheezing and hypoxia after returning from IV dye CT   Codeine Nausea Only   Erythromycin Nausea Only   Ibuprofen Nausea Only    Gi bleed   Nsaids Other (See Comments)    Gi Bleed    Current Outpatient Medications  Medication Sig Dispense Refill   amoxicillin  (AMOXIL ) 500 MG capsule Take 4 capsules (2,000 mg total) by mouth 1 hour prior to dental appointment. (Patient not taking: Reported on 09/22/2024) 12  capsule 0   apixaban  (ELIQUIS ) 5 MG TABS tablet Take 1 tablet (5 mg total) by mouth 2 (two) times daily. 180 tablet 3   clonazePAM  (KLONOPIN ) 0.5 MG tablet Take 1 tablet (0.5 mg total) by mouth 2 (two) times daily as needed for anxiety. 30 tablet 1   diltiazem  (CARDIZEM  CD) 120 MG 24 hr capsule Take 1 capsule (120 mg total) by mouth daily. 90 capsule 1   flecainide  (TAMBOCOR ) 50 MG tablet Take 1 tablet (50 mg total) by mouth 2 (two) times daily. May take additional tablet daily as needed for breakthrough atrial fib. 225 tablet 5   HYDROcodone -acetaminophen  (NORCO) 5-325 MG tablet Take 1 tablet by mouth at bedtime. 20 tablet 0   hydrOXYzine  (ATARAX ) 10 MG tablet Take 1 tablet (10 mg total) by mouth 3 (three) times daily as needed. 30 tablet 3   lamoTRIgine  (LAMICTAL ) 100 MG tablet Take 1 tablet (100 mg total) by mouth 2 (two) times daily. (NDC [phone]) 180 tablet 1   metoprolol  tartrate (LOPRESSOR ) 25 MG tablet Take 1 tablet (25 mg total) by mouth 2 (two) times daily. May take an additional tablet daily for breakthrough atrial fib [facility] [id number]) 180 tablet 1   omeprazole (PRILOSEC OTC) 20 MG tablet Take 20 mg by mouth daily.     ondansetron  (ZOFRAN ) 8 MG tablet Take 1 tablet (8 mg total) by mouth every 8 (eight) hours as needed for nausea or vomiting. Start on the third day after chemotherapy. 30 tablet 1   polyethylene glycol powder (GLYCOLAX /MIRALAX ) 17 GM/SCOOP powder Take 17 g by mouth daily. Dissolve 1 capful (17g) in 4-8 ounces of liquid and take by mouth daily. 238 g 3   prochlorperazine (COMPAZINE) 10 MG tablet Take 1 tablet (10 mg total) by mouth every 6 (six) hours as needed for nausea or vomiting. 30 tablet 1   propranolol  (INDERAL ) 10 MG tablet Take 1 tablet (10 mg total) by mouth up to 4 (four) times daily as needed for breakthrough episodes of atrial fibrillation. 120 tablet 5   thiamine  (VITAMIN B-1) 100 MG tablet Take 1 tablet (100 mg total) by mouth daily. (Patient not  taking: Reported on 09/22/2024)     traZODone  (DESYREL ) 100 MG tablet Take 1 tablet (100 mg total) by mouth at bedtime as needed.  90 tablet 1   No current facility-administered medications for this visit.    ROS   PHYSICAL EXAMINATION: There were no vitals taken for this visit. Physical Exam  Diagnostic Studies & Laboratory data:    CT Scan: *** PET/CT: *** EGD/EUS: The esophagus revealed a malignant appearing high- grade stricture immediately below the cricopharyngeus. This would not permit the passage of the standard endoscope beyond. . Biopsies were taken with a cold forceps for histology. - The stomach was unable to be examined. - The examined duodenum was unable to be examined. Manometry: *** Path: *** Radiation Hx: ***     I have independently reviewed the above radiology studies  and reviewed the findings with the patient.   Recent Lab Findings: Lab Results  Component Value Date   WBC 8.4 09/07/2024   HGB 13.0 09/07/2024   HCT 38.9 09/07/2024   PLT 528 (H) 09/07/2024   GLUCOSE 113 (H) 09/07/2024   CHOL 211 (H) 09/07/2024   TRIG 132 09/07/2024   HDL 84 09/07/2024   LDLCALC 104 (H) 09/07/2024   ALT 14 09/07/2024   AST 20 09/07/2024   NA 138 09/07/2024   K 4.3 09/07/2024   CL 98 09/07/2024   CREATININE 1.17 (H) 09/07/2024   BUN 7 (L) 09/07/2024   CO2 22 09/07/2024   TSH 0.443 08/26/2022   INR 1.0 08/26/2022   HGBA1C 5.3 09/07/2024       Problem List: ***  Assessment / Plan:   ***     I  spent {CHL ONC TIME VISIT - DTPQU:[id number]} with the patient face to face counseling and coordination of care.    Linnie MALVA Rayas 09/26/2024 8:06 AM

## 2024-09-26 NOTE — Progress Notes (Signed)
 [facility] CSW Progress Note  Clinical Social Worker returned patient's phone call to follow-up on health insurance concerns.    Interventions: Provided patient with information about CSW limitations on choosing health insurance.  She stated she understood.  Encouraged her to explore [email or web address].      Follow Up Plan:  Patient will contact CSW with any support or resource needs    Macario CHRISTELLA Au, LCSW Clinical Social Worker [facility]

## 2024-09-26 NOTE — Progress Notes (Signed)
 [facility] Clinical Social Work  Clinical Social Work was referred by statistician for disability concerns.  Clinical Social Worker contacted patient by phone to offer support and assess for needs.     Interventions: Provided patient with information about the [facility] Social Security Disability relationship.  Kathleen Perkins stated she is waiting for information from a PET scan, then will re-address disability.  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  Also emailed her contact information for [employer] that assists with rights of cancer patients.       Follow Up Plan:  CSW will follow-up with patient by phone     Kathleen CHRISTELLA Au, LCSW  Clinical Social Worker [facility]

## 2024-09-27 ENCOUNTER — Inpatient Hospital Stay

## 2024-09-27 ENCOUNTER — Other Ambulatory Visit: Payer: Self-pay

## 2024-09-27 ENCOUNTER — Telehealth: Payer: Self-pay

## 2024-09-27 ENCOUNTER — Ambulatory Visit

## 2024-09-27 ENCOUNTER — Ambulatory Visit: Payer: Self-pay

## 2024-09-27 ENCOUNTER — Telehealth: Payer: Self-pay | Admitting: Internal Medicine

## 2024-09-27 ENCOUNTER — Other Ambulatory Visit: Payer: Self-pay | Admitting: Oncology

## 2024-09-27 DIAGNOSIS — C159 Malignant neoplasm of esophagus, unspecified: Secondary | ICD-10-CM

## 2024-09-27 NOTE — Telephone Encounter (Signed)
 Patient requesting referral e sent over to [facility] with [facility].   FAX [phone] Direct line [phone]  Please advise,

## 2024-09-27 NOTE — Telephone Encounter (Signed)
 Received call from Ms. Kathleen Perkins. She is transferring all care to [facility] and has requested her appointments be cancelled. Original referral from Dr. Abran faxed to [phone] at her request.

## 2024-09-27 NOTE — Telephone Encounter (Signed)
 Pt placed nurse on hold to answer call from [facility]. Nurse waited just under 10 minutes for pt to return to call. Pt did not return - will place in call backs.   Copied from CRM [id number]. Topic: Clinical - Red Word Triage >> Sep 27, 2024 11:08 AM Kathleen Perkins wrote: Red Word that prompted transfer to Nurse Triage: Patient states she was just diagnosed  with esophageal cancer, and can't swallow. And feels like she is having panic attack, having anxiety attacks ,possibly a full mental health break .

## 2024-09-27 NOTE — Telephone Encounter (Signed)
 Dr Abran-  Please see patient request and advise.  She is unhappy with the care she briefly received at [facility]; states she has still not been advised of PET scan results and when she called Cancer Center was told that she 'didnt have stage 1 and didn't have stage 4, so probably a stage 2 or 3 cancer'. Says she just feels uncomfortable moving forward with treatment at [facility], doesn't feel like they are practicing well and would like a new referral to Dr Elida Minerva at [facility].  States that she is grateful for Dr [facility] care thus far.

## 2024-09-27 NOTE — Telephone Encounter (Signed)
 Patient calling with updated number   Fax [phone]  Phone [phone]

## 2024-09-27 NOTE — Telephone Encounter (Signed)
 FYI Only or Action Required?: FYI only for provider.  Patient was last seen in primary care on 09/15/2024 by Myrla Jon HERO, MD.  Called Nurse Triage reporting Panic Attack.   Triage Disposition: Call PCP When Office is Open  Patient/caregiver understands and will follow disposition?: Yes     Copied from CRM [id number]. Topic: Clinical - Red Word Triage >> Sep 27, 2024 11:08 AM Kathleen Perkins wrote: Red Word that prompted transfer to Nurse Triage: Patient states she was just diagnosed  with esophageal cancer, and can't swallow. And feels like she is having panic attack, having anxiety attacks ,possibly a full mental health break .     Reason for Disposition  Nursing judgment or information in reference  Answer Assessment - Initial Assessment Questions I called the patient who informed me that she is speaking with [facility] and will call back when she is able to. She is in no acute distress at the time that I spoke with her.  Protocols used: No Guideline Available-A-AH

## 2024-09-27 NOTE — Telephone Encounter (Signed)
 Kathleen Perkins,  I am truly sorry that she has been unhappy with her care to date.  It should be noted that in 14 days, she has had endoscopy with biopsies, pathology, several advanced imaging studies, oncology evaluation, radiation oncology evaluation, and was to see the thoracic surgeon this week.  If you can make the referral that she requested, please do ASAP. Otherwise, this can be handled by the PCP.  I wish her well.  Thanks,  Dr. Abran

## 2024-09-27 NOTE — Telephone Encounter (Signed)
 Please offer her an acute appt with any provider with openings for this acute stress reaction/panic symptoms.

## 2024-09-28 ENCOUNTER — Ambulatory Visit: Payer: Self-pay

## 2024-09-28 ENCOUNTER — Telehealth: Payer: Self-pay

## 2024-09-28 NOTE — Telephone Encounter (Signed)
 Received voicemail from Ms. Raz with updated fax number. Referral from Dr. Abran re faxed to [phone]. A previous referral to [facility] was sent to Dr. Darra at her request and she is scheduled with him. All records are available in care everywhere.

## 2024-09-28 NOTE — Telephone Encounter (Signed)
 [facility] referral along with 49 pages of medical records have been faxed attn: Sela Minerva fax [phone] as requested by the patient. Patient has been advised of this referral as well and verbalizes understanding.

## 2024-09-28 NOTE — Telephone Encounter (Signed)
 Spoke with [name] on 09/26/24 and provided results of PET scan after review by Dr. Babara. Per Dr. Babara she has locally advanced disease and should proceed with chemoradiation as planned. She expressed understanding of the plan.   IMPRESSION: 1. Lower cervical and upper thoracic esophageal mass measuring 4.5 cm in length with maximum SUV 16.2, consistent with primary malignancy. 2. Hypermetabolic nodal metastases involving adjacent lower cervical paraesophageal, bilateral lower neck level IV and bilateral upper mediastinal regions.  At this time, Kathleen Perkins has called twice to make me aware of how unhappy she has been with her care. Mostly because her PET results were released to her my chart before the physician had a chance to review them with her. She finds this very unprofessional. She originally had an appointment in simulation on 09/26/24. At that time the physician would have reviewed her PET results with her. She had requested that the appointment be moved to 09/29/24 due to work. She has since cancelled that appointment and is transferring care to [facility].

## 2024-09-28 NOTE — Telephone Encounter (Signed)
 FYI Only or Action Required?: Action required by provider: request for appointment. Would like PET scan results.  Difficulty getting response from MD who ordered, no follow up planned or scheduled with ordering MD Willing to schedule sooner appt, nothing available with Dr. KATHEE before December 8  Plans to go to [facility] for treatment moving forward.  Patient was last seen in primary care on 09/15/2024 by Myrla Jon HERO, MD.  Called Nurse Triage reporting Anxiety.  Symptoms began several weeks ago.  Interventions attempted: Prescription medications: klonopin .  Symptoms are: gradually worsening.  Triage Disposition: see provider in 3 days  Patient/caregiver understands and will follow disposition?: NO  Copied from CRM #[id number]. Topic: Clinical - Red Word Triage >> Sep 28, 2024 10:31 AM Treva T wrote: [facility] that prompted transfer to Nurse Triage: Patient calling, states she is feeling overwhelmed, she recently received a cancer diagnosis, and anxiety is worsening, and emotionally overwhelmed, to the point she cannot work.  Patient states emotional health is not good right now.  Requesting to speak to nurse regarding her symptoms. Reason for Disposition  Recent traumatic event (e.g., death of a loved one, job loss, victim/witness of crime)  Answer Assessment - Initial Assessment Questions 1. CONCERN: Did anything happen that prompted you to call today?      Not able to swallow, concern over recent cancer diagnosis and difficulty getting information and how medication is delivered, very concerned about her work future 2. ANXIETY SYMPTOMS: Can you describe how you (your loved one; patient) have been feeling? (e.g., tense, restless, panicky, anxious, keyed up, overwhelmed, sense of impending doom).      Overwhelmed coupled with overwhelming job 3. ONSET: How long have you been feeling this way? (e.g., hours, days, weeks)     Two weeks 4. SEVERITY: How would you rate the  level of anxiety? (e.g., 0 - 10; or mild, moderate, severe).     moderate 5. FUNCTIONAL IMPAIRMENT: How have these feelings affected your ability to do daily activities? Have you had more difficulty than usual doing your normal daily activities? (e.g., getting better, same, worse; self-care, school, work, interactions)     Unable to swallow, too emotional to go to work, concern for being able to work once chemo and radiation start 6. HISTORY: Have you felt this way before? Have you ever been diagnosed with an anxiety problem in the past? (e.g., generalized anxiety disorder, panic attacks, PTSD). If Yes, ask: How was this problem treated? (e.g., medicines, counseling, etc.)     History of bipolar 7. RISK OF HARM - SUICIDAL IDEATION: Do you ever have thoughts of hurting or killing yourself? If Yes, ask:  Do you have these feelings now? Do you have a plan on how you would do this?     denies 8. TREATMENT:  What has been done so far to treat this anxiety? (e.g., medicines, relaxation strategies). What has helped?     Has klonopin  only, but has been hesitant to take in order to stay clear headed.  Limited options available due to bipolar 9. THERAPIST: Do you have a counselor or therapist? If Yes, ask: What is their name?     States that [facility] has therapists that she can see 11. PATIENT SUPPORT: Who is with you now? Who do you live with? Do you have family or friends who you can talk to?        No family support, family is deceased, lives with boyfriend 71. OTHER SYMPTOMS: Do you  have any other symptoms? (e.g., feeling depressed, trouble concentrating, trouble sleeping, trouble breathing, palpitations or fast heartbeat, chest pain, sweating, nausea, or diarrhea)       overwhelmed  Protocols used: Anxiety and Panic Attack-A-AH

## 2024-09-29 ENCOUNTER — Telehealth: Payer: Self-pay

## 2024-09-29 ENCOUNTER — Ambulatory Visit

## 2024-09-29 ENCOUNTER — Inpatient Hospital Stay

## 2024-09-29 NOTE — Telephone Encounter (Signed)
 She was given results yesterday by Oncology.  We can offer her any open appts to be seen sooner for anxiety if she would like.

## 2024-09-29 NOTE — Telephone Encounter (Signed)
 Patient appointment updated to 10/06/24 at 1:00 pm. She also reports she will be dropping off forms to allow her to keep her insurance that she needs filled out and faxed to [phone] Attn Charlies Don

## 2024-09-29 NOTE — Telephone Encounter (Signed)
 Patient reports forms should be faxed to [phone] and Attn to Charlies Don

## 2024-09-29 NOTE — Telephone Encounter (Signed)
 Please keep an eye out for these forms and fill in whatever info you are able to when they come in.  Will see if they can be completed or if they will need an OV.

## 2024-09-29 NOTE — Telephone Encounter (Signed)
 Copied from CRM #[id number]. Topic: General - Call Back - No Documentation >> Sep 29, 2024  8:59 AM Darshell M wrote: Reason for CRM: Patient spoke with Triage RN Ellouise yesterday and wants to continue to speak with her regarding having her forms completed to keep her insurance. Advised patient Ellouise is a insurance claims handler. Patient still wants to speak with her. Patient will drop off forms to clinic today.

## 2024-09-30 ENCOUNTER — Encounter: Admitting: Thoracic Surgery (Cardiothoracic Vascular Surgery)

## 2024-09-30 NOTE — Progress Notes (Addendum)
 [facility] Central Navigation Program: Pre-Consult Assessment (PCA)  Patient Story:  Ms. Nicklaus is a funny and very kind 63 yr old living with her boyfriend Lynden) in [place], [place]. 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 Patient will be coming to [facility] for a consult next week. Patient has a positive but limited network of social support around her consisting of her boyfriend and brother Antonio.) Patient denies any current appetite or nutrition concerns at this time. Patient does not have any digestive issues with nausea or trouble chewing, but does have some problems swallowing. Patient advised she's experienced an unexpected and unintended weight loss of roughly 8lbs within the past few months. Patient denies any falls within the past year and she does not feel unsteady when walking or standing. Patient does not need to use any mobility assistive devices when getting around. OPN discussed onsite and external resources for social/peer support such as CCSP, PFRC, Coping with Cancer, Stronger Together, Yates Hope, Colorectal Cancer Alliance, [employer], Oncology Dietitian, Massage Therapy and Reiki specialists to take advantage of as an added benefit. There are no current financial, transportation, food or housing insecurities at this time. Patient is ready for her upcoming appointments, she states communication is big for me. She wants to know what is going on and feel informed about her care. She expressed all these things are happening fast, but I want to move forward as quickly as possible. OPN encouraged patient that she is in great hands, the team is here to listen, support, help guide and provide direction along this path with her. Patient was very appreciative of the outreach call, grateful to learn about the resource information and looks forward to working with this team. OPN provided  patient with direct contact number and encouraged her to reach out should she have any non-medical questions or needs ahead of her consult.   Completed with: Patient Recruitment Consultant Access and Use : is an active Restaurant Manager, [employer] - How often the internet is accessed : Every Day  Preferred Communication: MyChart, Email, and Phone Social Support System: Limited The following are who provide support :Significant Other and Siblings Transportation plan for consult visit: personal Librarian, Academic Resources Strain: not present Current nutrition or appetite concerns: No change in appetite and Weight loss of roughly 8lbs  Education/Referrals/Interventions: Human Resources Officer, Coping/Emotional Support, External Support Resources, Nutritional Therapist, Internal Support resources, Visit Prep, Colorectal Cancer Alliance, [facility] Hope  Oncology Patient Navigator (OPN) completed a proactive outreach call. OPN provided a warm welcome to [facility] and reviewed the role of OPN. OPN conducted initial evaluation of potential barriers and reinforced expectations for upcoming appointment including general education regarding: appt flow, clinic environment, care team, and appropriate points of contact. OPN encouraged patient to return to PCP or referring physician if any medical needs arise prior to consult.  Our team is available to assist patient with any possible non-medical barriers that may impact the patient for the consult appointment.  Navigation Care Plan: Patient verbalized understanding of information provided and expressed appreciation for proactive support. If the patient pursues treatment delivered at participating [facility] locations, the OPN will provide further/ongoing patient, family, and care partner resource education, support and referral for non-medical needs.

## 2024-10-03 NOTE — Telephone Encounter (Signed)
 Copied from CRM [id number]. Topic: General - Other >> Oct 03, 2024  2:32 PM Donna BRAVO wrote: Reason for CRM: Patient boyfriend Medford dropped off forms to be filled out by Dr Myrla MD,  Non-FMLA paper work. Medford dropped off forms at the office on 09/29/24.  Formes need filled out and provider signature and faxed back to work ASAP, employer was expecting them on 09/30/24    Please fax to Att: Charlies Don HR  fax number [phone]

## 2024-10-04 ENCOUNTER — Inpatient Hospital Stay

## 2024-10-04 NOTE — Telephone Encounter (Signed)
 Called pt and advised her.  She states that she will call her employer and find out what they do need and let Dr. B know at her appointment Thursday.  I am leaving the forms that we do have at the front for her to pick up at her appointment.

## 2024-10-04 NOTE — Telephone Encounter (Signed)
 Reviewed the forms dropped off by patient. These are for non-FMLA leave and there is no section for us  to complete or sign.  The patient has already completed the form and it gets approved by HR at her work. Left forms with Delon at front desk to call patient. Of note, patient has visit on 11/6.

## 2024-10-05 ENCOUNTER — Other Ambulatory Visit: Payer: Self-pay | Admitting: Family Medicine

## 2024-10-05 ENCOUNTER — Other Ambulatory Visit: Payer: Self-pay

## 2024-10-05 MED ORDER — CLONAZEPAM 0.5 MG PO TABS
0.5000 mg | ORAL_TABLET | Freq: Two times a day (BID) | ORAL | 1 refills | Status: DC | PRN
  Filled 2024-10-05: qty 30, 15d supply, fill #0
  Filled 2024-11-08: qty 30, 15d supply, fill #1

## 2024-10-05 MED ORDER — LAMOTRIGINE 100 MG PO TABS
100.0000 mg | ORAL_TABLET | Freq: Two times a day (BID) | ORAL | 1 refills | Status: AC
  Filled 2024-10-05: qty 180, 90d supply, fill #0

## 2024-10-06 ENCOUNTER — Other Ambulatory Visit: Payer: Self-pay

## 2024-10-06 ENCOUNTER — Encounter: Payer: Self-pay | Admitting: Family Medicine

## 2024-10-06 ENCOUNTER — Ambulatory Visit (INDEPENDENT_AMBULATORY_CARE_PROVIDER_SITE_OTHER): Admitting: Family Medicine

## 2024-10-06 VITALS — BP 120/77 | HR 76 | Ht 64.0 in | Wt 171.9 lb

## 2024-10-06 DIAGNOSIS — Z23 Encounter for immunization: Secondary | ICD-10-CM

## 2024-10-06 DIAGNOSIS — C159 Malignant neoplasm of esophagus, unspecified: Secondary | ICD-10-CM | POA: Diagnosis not present

## 2024-10-06 DIAGNOSIS — F419 Anxiety disorder, unspecified: Secondary | ICD-10-CM

## 2024-10-06 DIAGNOSIS — F4325 Adjustment disorder with mixed disturbance of emotions and conduct: Secondary | ICD-10-CM | POA: Diagnosis not present

## 2024-10-06 DIAGNOSIS — F3160 Bipolar disorder, current episode mixed, unspecified: Secondary | ICD-10-CM | POA: Diagnosis not present

## 2024-10-06 NOTE — Progress Notes (Signed)
 Acute visit   Patient: Kathleen Perkins   DOB: 1961-01-23   63 y.o. Female  MRN: [id number] PCP: Myrla Jon HERO, MD   Chief Complaint  Patient presents with   Anxiety    Anxiety x 2-3 weeks. RN triage notes feeling overwhelmed with new cancer diagnoses and feelings of anxiety. Unable to work due to this, causing significant stress for her.    Immunizations    Patient requests to receive 1st dose of shingles vaccine today    Subjective    Discussed the use of AI scribe software for clinical note transcription with the patient, who gave verbal consent to proceed.  History of Present Illness   Kathleen Perkins is a 63 year old female with esophageal cancer and bipolar disorder who presents with anxiety and adjustment reaction.  She experiences significant anxiety and emotional dysregulation, including anger and pacing, related to her bipolar disorder and cancer diagnosis. She takes Lamictal  and occasionally uses hydroxyzine  for sleep. Klonopin  is available but used sparingly due to its sedative effects. She denies suicidal or homicidal ideation.  She was diagnosed with esophageal cancer after a PET scan revealed malignant lymph nodes. She is undergoing treatment at [facility], including radiation and low-dose chemotherapy. She reports pain and difficulty eating, necessitating protein shakes.  She has been on medical leave since September 22, 2024, due to her health condition and job-related stress. Her work environment is highly stressful, contributing to anxiety and atrial fibrillation episodes. She is in the process of obtaining documentation for disability benefits. She plans to return to work post-treatment clearance.       Review of Systems  Objective    BP 120/77 (BP Location: Left Arm, Patient Position: Sitting, Cuff Size: Normal)   Pulse 76   Ht 5' 4 (1.626 m)   Wt 171 lb 14.4 oz (78 kg)   SpO2 100%   BMI 29.51 kg/m   Physical Exam Vitals reviewed.  Constitutional:       General: She is not in acute distress.    Appearance: Normal appearance. She is well-developed.  HENT:     Head: Normocephalic and atraumatic.  Eyes:     General: No scleral icterus.    Conjunctiva/sclera: Conjunctivae normal.  Cardiovascular:     Rate and Rhythm: Normal rate.  Pulmonary:     Effort: Pulmonary effort is normal. No respiratory distress.  Skin:    General: Skin is warm and dry.     Findings: No rash.  Neurological:     Mental Status: She is alert and oriented to person, place, and time. Mental status is at baseline.  Psychiatric:        Behavior: Behavior normal.       No results found for any visits on 10/06/24.  Assessment & Plan     Problem List Items Addressed This Visit       Digestive   Primary esophageal squamous cell carcinoma (HCC) (Chronic)     Other   Anxiety   Bipolar 1 disorder, mixed (HCC)   Other Visit Diagnoses       Immunization due    -  Primary   Relevant Orders   Varicella-zoster vaccine IM (Completed)     Adjustment disorder with mixed disturbance of emotions and conduct               Esophageal cancer Recently diagnosed with esophageal cancer. Undergoing treatment at [facility] with radiation and low-dose chemotherapy. Experiencing pain and difficulty  eating, requiring protein shakes. Concerns about potential surgery and voice loss. Prefers to avoid surgery and is optimistic about treatment outcomes. - Continue radiation and chemotherapy as planned at [facility]. - Consulted with child psychotherapist at [facility] for assistance with work and insurance issues. - Will discuss potential need for G-tube placement with oncology team.  Adjustment disorder with mixed disturbance of emotions and conduct Experiencing anxiety, anger, and emotional outbursts due to recent cancer diagnosis. Not in a manic state. Currently in therapy at [facility], which is beneficial. Prefers therapy over medication for anxiety management. - Continue therapy sessions at [facility]. -  Encouraged discussion with therapy team about potential psychiatric support.  Bipolar disorder Currently managed with Lamictal . Prefers not to take additional medications, including antipsychotics, Depakote, or Lithium. Believes current symptoms are due to adjustment disorder rather than bipolar disorder. - Continue Lamictal  at current dose. - Will discuss potential psychiatric support with therapy team.  Anxiety disorder Experiencing anxiety related to cancer diagnosis and treatment. Prefers not to take Klonopin  due to side effects. Uses hydroxyzine  occasionally for sleep. Therapy is a preferred method for managing anxiety. - Continue therapy sessions at [facility]. - Use hydroxyzine  as needed for sleep.  General Health Maintenance Due for shingles vaccination, especially important due to upcoming chemotherapy. - Administered first dose of shingles vaccine today. - Will schedule second dose of shingles vaccine in February.       No orders of the defined types were placed in this encounter.   I personally spent a total of 32 minutes in the care of the patient today including preparing to see the patient, getting/reviewing separately obtained history, performing a medically appropriate exam/evaluation, counseling and educating, documenting clinical information in the EHR, and coordinating care.   Return in about 3 months (around 01/06/2025) for CPE.      Jon Eva, MD  [facility] [phone] (phone) [phone] (fax)  [facility]

## 2024-10-07 ENCOUNTER — Other Ambulatory Visit: Payer: Self-pay

## 2024-10-10 ENCOUNTER — Ambulatory Visit

## 2024-10-11 ENCOUNTER — Ambulatory Visit

## 2024-10-12 ENCOUNTER — Ambulatory Visit

## 2024-10-12 ENCOUNTER — Other Ambulatory Visit: Payer: Self-pay

## 2024-10-12 MED ORDER — PROCHLORPERAZINE MALEATE 10 MG PO TABS
10.0000 mg | ORAL_TABLET | Freq: Four times a day (QID) | ORAL | 99 refills | Status: DC | PRN
  Filled 2024-10-12: qty 60, 15d supply, fill #0
  Filled 2024-11-08: qty 60, 15d supply, fill #1

## 2024-10-12 MED ORDER — ONDANSETRON HCL 8 MG PO TABS
8.0000 mg | ORAL_TABLET | Freq: Three times a day (TID) | ORAL | 99 refills | Status: DC | PRN
  Filled 2024-10-12: qty 60, 20d supply, fill #0

## 2024-10-13 ENCOUNTER — Ambulatory Visit

## 2024-10-13 ENCOUNTER — Other Ambulatory Visit (HOSPITAL_COMMUNITY): Payer: Self-pay

## 2024-10-14 ENCOUNTER — Ambulatory Visit

## 2024-10-14 ENCOUNTER — Other Ambulatory Visit: Payer: Self-pay

## 2024-10-17 ENCOUNTER — Ambulatory Visit

## 2024-10-18 ENCOUNTER — Ambulatory Visit

## 2024-10-19 ENCOUNTER — Ambulatory Visit

## 2024-10-19 ENCOUNTER — Inpatient Hospital Stay

## 2024-10-20 ENCOUNTER — Ambulatory Visit

## 2024-10-21 ENCOUNTER — Other Ambulatory Visit: Payer: Self-pay

## 2024-10-21 ENCOUNTER — Ambulatory Visit

## 2024-10-24 ENCOUNTER — Other Ambulatory Visit: Payer: Self-pay

## 2024-10-24 ENCOUNTER — Ambulatory Visit

## 2024-10-25 ENCOUNTER — Ambulatory Visit

## 2024-10-26 ENCOUNTER — Ambulatory Visit

## 2024-10-31 ENCOUNTER — Ambulatory Visit

## 2024-11-01 ENCOUNTER — Ambulatory Visit

## 2024-11-01 NOTE — Telephone Encounter (Signed)
 Dottie, This would need to go to the oncology care coordinator at the [facility] cancer Center Dr. Abran

## 2024-11-01 NOTE — Telephone Encounter (Signed)
 Patient calling back today stating that the physician she was seeing recently for oncology is no longer taking [employer] as of tomorrow. She would like Dr Nancyann recommendation on an oncologist in [place] that specializes in her cancer type and is used to radiating these types of things. States the thoracic surgeon she saw told her that her cancer was in a different spot than it normally is with esophageal cancer.  Of note, patient was seen at [facility] and was not satisfied with the care she received there.

## 2024-11-01 NOTE — Telephone Encounter (Signed)
 PT is requesting to speak to a nurse about getting a new referral sent to an oncologist in [place]. The previous place she was referred to does not take her insurance. Please advise.

## 2024-11-01 NOTE — Progress Notes (Signed)
 Called [employer]. After several transfers, I spoke to person in Authorization department. She states that the department that does CoC approvals works on their own timeline. I explained the situation - patient has esophageal cancer and was supposed to start chemoradiation on 12/1. Shared the dire situation and asked that this be noted in the chart. She asked for fax number and advised we would be updated this way.   Reference # Kathleen Perkins 11/01/2024 9:24 am  Length of call > 45 min  Called and spoke to Kathleen Perkins to provide update.  Request for referral to be resent to:  [facility] Phone: [phone] Fax: [phone]  Kathleen Perkins is interested in speaking with Financial Navigator and requests a telephone call. Chat sent to financial navigator at [facility].

## 2024-11-02 ENCOUNTER — Ambulatory Visit

## 2024-11-02 NOTE — Addendum Note (Signed)
 Addended by: CLAUDENE NAOMIE SAILOR on: 11/02/2024 10:51 AM   Modules accepted: Orders

## 2024-11-02 NOTE — Telephone Encounter (Signed)
 New referral placed to [facility] in [place].

## 2024-11-02 NOTE — Telephone Encounter (Signed)
 Left message on machine to call back

## 2024-11-02 NOTE — Progress Notes (Signed)
 [employer] authorization. I was advised that patient does not have active account as of 11/5. Upon looking more closely, they advised me that patient has [employer] account and attempted to transfer me (Phone # [phone]). This was a phone number for [employer]. I was unable to speak to a representative as this was not an option.   I called [employer] back and asked for Authorization department. Once we connected, the representative was not able to assist me, stating that the line was for medication authorizations. I asked to be transferred to department that could help. I was sent to a survey and not to another department.   I returned call to [employer] and asked for Eligibility department. Transferred to Pre-certification. The Continuity of Care has not been authorized. Representative was able to confirm that this request is listed as Urgent and High Priority. Asked for direct line.   [employer]: [phone] Pre-Certification  Attempted to reach Ms. Meisenheimer to update. Left voicemail and will send MyChart message.

## 2024-11-02 NOTE — Telephone Encounter (Signed)
 Kathleen Perkins I made her aware of the need to speak with [facility]- does a referral need to be made?

## 2024-11-03 ENCOUNTER — Ambulatory Visit

## 2024-11-03 NOTE — Telephone Encounter (Signed)
 A referral was placed to [facility] in [place], however, per chart review, it seems patient is still receiving care at [facility] and is in talks with insurance about continuing care there vs seeing [facility].

## 2024-11-04 ENCOUNTER — Ambulatory Visit: Payer: Self-pay

## 2024-11-04 NOTE — Telephone Encounter (Signed)
 Patient called stating that she needed all gastro records from 10/1 on, including her path results sent to [facility] insurance at fax # [phone] att file claim. Claim # [id number].

## 2024-11-07 ENCOUNTER — Ambulatory Visit

## 2024-11-07 ENCOUNTER — Ambulatory Visit: Admitting: Family Medicine

## 2024-11-08 ENCOUNTER — Other Ambulatory Visit: Payer: Self-pay

## 2024-11-08 ENCOUNTER — Ambulatory Visit: Payer: Self-pay

## 2024-11-08 MED FILL — Hydroxyzine HCl Tab 10 MG: ORAL | 10 days supply | Qty: 30 | Fill #1 | Status: AC

## 2024-11-09 ENCOUNTER — Ambulatory Visit: Payer: Self-pay

## 2024-11-10 ENCOUNTER — Other Ambulatory Visit: Payer: Self-pay

## 2024-11-10 ENCOUNTER — Ambulatory Visit: Payer: Self-pay

## 2024-11-10 ENCOUNTER — Telehealth: Payer: Self-pay | Admitting: *Deleted

## 2024-11-10 ENCOUNTER — Telehealth: Payer: Self-pay

## 2024-11-10 MED ORDER — ONDANSETRON 8 MG PO TBDP
8.0000 mg | ORAL_TABLET | Freq: Three times a day (TID) | ORAL | 1 refills | Status: AC | PRN
  Filled 2024-11-10: qty 30, 10d supply, fill #0
  Filled 2024-12-23: qty 30, 10d supply, fill #1

## 2024-11-10 NOTE — Telephone Encounter (Signed)
 Reached out to patient after having speaking with a nurse in GYN-Onc who reported that patient wanted all of her appointments with oncology cancelled because she felt her insurance would not cover these appointments. Left a voicemail asking patient to call Dr. Clovis Nurse. Meanwhile, Dr. Autumn made aware and is starting communication with Nurse Navigator and any other necessary staff to try and help facilitate patient care.

## 2024-11-10 NOTE — Telephone Encounter (Signed)
 Patient left a voicemail wanting to speak with our oncology staff. It was relayed via Nurse Navigator that this patient is already established with [facility] or [facility] and that she would need to follow up with one of those affiliations with her questions or concerns. A voicemail was left letting patient know to follow up with her current oncology practice.

## 2024-11-10 NOTE — Telephone Encounter (Signed)
 Kathleen Perkins called and asked to cancel all her upcoming appts. I let her know that I did not work in Dr. Clovis office. She asked that I please just cancel all upcoming appointments' with him.

## 2024-11-11 ENCOUNTER — Inpatient Hospital Stay: Payer: Self-pay

## 2024-11-11 ENCOUNTER — Ambulatory Visit

## 2024-11-11 ENCOUNTER — Other Ambulatory Visit: Payer: Self-pay

## 2024-11-11 ENCOUNTER — Other Ambulatory Visit: Payer: Self-pay | Admitting: Physician Assistant

## 2024-11-11 ENCOUNTER — Inpatient Hospital Stay: Payer: Self-pay | Admitting: Oncology

## 2024-11-11 MED ORDER — POLYETHYLENE GLYCOL 3350 17 GM/SCOOP PO POWD
17.0000 g | Freq: Every day | ORAL | 3 refills | Status: AC
  Filled 2024-11-11: qty 238, 14d supply, fill #0

## 2024-11-14 ENCOUNTER — Ambulatory Visit

## 2024-11-15 ENCOUNTER — Other Ambulatory Visit: Payer: Self-pay

## 2024-11-15 ENCOUNTER — Ambulatory Visit

## 2024-11-15 NOTE — Progress Notes (Signed)
 Pt reports 2/10 chest pain right now. At it's worst, it is 8/10. Pain started over the weekend. SOB this morning.

## 2024-11-16 ENCOUNTER — Ambulatory Visit: Payer: Self-pay

## 2024-11-16 ENCOUNTER — Telehealth: Payer: Self-pay | Admitting: Family Medicine

## 2024-11-16 ENCOUNTER — Other Ambulatory Visit: Payer: Self-pay

## 2024-11-16 NOTE — Telephone Encounter (Signed)
 Copied from CRM #[id number]. Topic: General - Other >> Nov 16, 2024  4:16 PM Victoria B wrote: Reason for CRM: Patient called in, spoke with NT about mucus problem. She wanted to let Dr Myrla know that oncologist says that's its ok for her to use atrovent  with chemo

## 2024-11-16 NOTE — Telephone Encounter (Signed)
 FYI Only or Action Required?: Action required by provider: request for medication.  Patient was last seen in primary care on 10/06/2024 by Myrla Jon HERO, MD.  Called Nurse Triage reporting No chief complaint on file..  Symptoms began several weeks ago.  Interventions attempted: Nothing.  Symptoms are: gradually worsening.  Triage Disposition: No disposition on file.  Patient/caregiver understands and will follow disposition?:  Reason for Disposition  Nursing judgment or information in reference  Answer Assessment - Initial Assessment Questions 1. REASON FOR CALL: What is your main concern right now?    SOB a little worse than normal. Patient wants to get atrovent  because she thinks it will help with symptoms. Patient told by radiation doctor to request med from Dr Callie. Mucous feels like a plug and immoveable in throat. Taking mucinex also. [facility] Has had insurance issues this year and had some canceled appointments. Has chemo and radiation 5 days a week 2. ONSET: When did the Mucous start?     Patient said she crashed after having a prednisone injection in her radiation and is now feeling worse Sunday.  3. SEVERITY: How bad is the breathing?     Very bad, worse in the morning. Going through treatment for lung cancer and says she feels a little worse than her 'normal' breathing issues. 6. FEVER: Do you have a fever?     Denies 8. TREATMENTS AND RESPONSE: What have you done so far to try to make this better? What medicines have you used?     mucinex  Protocols used: No Guideline Available-A-AH

## 2024-11-17 ENCOUNTER — Other Ambulatory Visit: Payer: Self-pay | Admitting: Family Medicine

## 2024-11-17 ENCOUNTER — Ambulatory Visit: Payer: Self-pay

## 2024-11-17 ENCOUNTER — Telehealth: Payer: Self-pay | Admitting: Internal Medicine

## 2024-11-17 ENCOUNTER — Other Ambulatory Visit: Payer: Self-pay

## 2024-11-17 MED ORDER — ATROVENT HFA 17 MCG/ACT IN AERS
2.0000 | INHALATION_SPRAY | Freq: Four times a day (QID) | RESPIRATORY_TRACT | 0 refills | Status: AC | PRN
  Filled 2024-11-17: qty 12.9, 30d supply, fill #0

## 2024-11-17 MED ORDER — LIDOCAINE VISCOUS HCL 2 % MT SOLN
OROMUCOSAL | 0 refills | Status: AC
  Filled 2024-11-17: qty 100, 3d supply, fill #0

## 2024-11-17 MED ORDER — DIPHENHYDRAMINE HCL CHILDRENS 12.5 MG/5ML PO LIQD
ORAL | 0 refills | Status: AC
  Filled 2024-11-17: qty 118, 3d supply, fill #0

## 2024-11-17 MED ORDER — ALUM & MAG HYDROXIDE-SIMETH 1200-1200-120 MG/30ML PO SUSP
ORAL | 0 refills | Status: AC
  Filled 2024-11-17: qty 355, 6d supply, fill #0

## 2024-11-17 MED ORDER — DILTIAZEM HCL 30 MG PO TABS
30.0000 mg | ORAL_TABLET | Freq: Four times a day (QID) | ORAL | 3 refills | Status: AC
  Filled 2024-11-17: qty 120, 30d supply, fill #0

## 2024-11-17 NOTE — Telephone Encounter (Signed)
 Called and spoke with the patient.  Patient complains of shortness of breath all the time possibly due to chemo treatments as well mucous.  Patient has an appointment with Dr. Mady on 12/07/24 and wanted to know if she can be seen earlier.  Offered an appointment with Dr. Argentina on 11/21/24.  Patient states that she has an appointment with [facility] today, and will call back and let us  know what she wants to do after that appointment.  Patient expressed gratitude for the call.

## 2024-11-17 NOTE — Telephone Encounter (Signed)
Duplicate.  See other phone note.

## 2024-11-17 NOTE — Telephone Encounter (Signed)
 FYI Only or Action Required?: Action required by provider: Would like to be prescribed atrovent , would like a phone call back.  Patient was last seen in primary care on 10/06/2024 by Myrla Jon HERO, MD.  Called Nurse Triage reporting Shortness of Breath.  Symptoms began several months ago.  Interventions attempted: Rest, hydration, or home remedies.  Symptoms are: unchanged.  Triage Disposition: See HCP Within 4 Hours (Or PCP Triage)  Patient/caregiver understands and will follow disposition?: No, wishes to speak with PCP  Reason for Disposition  Wheezing is present  Answer Assessment - Initial Assessment Questions Patient had chemo recently and is feeling SOB, can't swallow, can't eat, feels like I need an inhaler, sob due to mucus build up, feels dizzy with lack of oxygen, no pulse ox at home. Patient requesting atrovent  that was Ok'd to take per oncologist but would like patient's PCP to prescribe. Patient aware that message will be sent to PCP regarding this and is expecting a phone call.   1. ONSET: When did the cough begin?      October 21 2. SEVERITY: How bad is the cough today?      ok 3. SPUTUM: Describe the color of your sputum (e.g., none, dry cough; clear, white, yellow, green)     Not really 4. HEMOPTYSIS: Are you coughing up any blood? If Yes, ask: How much? (e.g., flecks, streaks, tablespoons, etc.)     denies 5. DIFFICULTY BREATHING: Are you having difficulty breathing? If Yes, ask: How bad is it? (e.g., mild, moderate, severe)      Yes when coughing 6. FEVER: Do you have a fever? If Yes, ask: What is your temperature, how was it measured, and when did it start?     denies 7. CARDIAC HISTORY: Do you have any history of heart disease? (e.g., heart attack, congestive heart failure)      HTN 8. LUNG HISTORY: Do you have any history of lung disease?  (e.g., pulmonary embolus, asthma, emphysema)     No  9. PE RISK FACTORS: Do you have a  history of blood clots? (or: recent major surgery, recent prolonged travel, bedridden)     Taken a blood thinner had a stroke 10. OTHER SYMPTOMS: Do you have any other symptoms? (e.g., runny nose, wheezing, chest pain)       Sob, lightheadedness, coughing,  Protocols used: Cough - Acute Non-Productive-A-AH

## 2024-11-17 NOTE — Telephone Encounter (Signed)
 Pt c/o Shortness Of Breath: STAT if SOB developed within the last 24 hours or pt is noticeably SOB on the phone  1. Are you currently SOB (can you hear that pt is SOB on the phone)? No   2. How long have you been experiencing SOB?  2 weeks   3. Are you SOB when sitting or when up moving around? Moving around   4. Are you currently experiencing any other symptoms?  Chest heaviness

## 2024-11-17 NOTE — ED Provider Notes (Signed)
 [facility] Emergency Department Provider Note    ED Clinical Impression     Diagnosis ICD-10-CM Associated Orders  1. Cardiac arrhythmia, unspecified cardiac arrhythmia type  I49.9 Request for ED to OP Subspecialty Care          Impression, Medical Decision Making, Progress Notes and Critical Care    Impression, Differential Diagnosis and Plan of Care  Taziah Difatta is a 63 y.o. female with past medical history of cervical esophageal squamous cell carcinoma (currently receiving radiation), paroxysmal A-fib (on Eliquis ), embolic stroke, hypertension, hyperlipidemia, and bipolar disorder presenting for shortness of breath, palpitations, and chest pressure that has been worsening over the last few days.  Vital signs notable for tachycardia to 130 BPM, but otherwise reassuring. On exam, the patient is non-toxic appearing and in no acute distress. Physical exam is notable for an irregularly ireegular heart rate in the 130s-140s. She is sitting up on the stool next to the bed in no acute distress.  No increased work of breathing and she is warm and well perfused.    Differential includes afib 2/2 missing her medications due to her challenges swallowing. She is also going through significant stress given her cancer treatments and other medical illnesses.  PE seems unlikely as she had no missed any eliquis  dosing.   Plan for basic labs, magnesium , TSH, cardiac markers, PT-INR, urinalysis, EKG, and chest x-ray . Will consult cardiology. Will give IV fluids. While the patient was waiting to be seen she took her home flecainide  and metoprolol  from her purse.   3:50 PM Cardiology paged to discuss possible plan for cardioversion.  4:25 PM Spoke with Cardiology who states that admission and cardioversion would most likely not be beneficial and they also would not be able to do this tomorrow and she would have a high liklihood of going back into afib. Patient will likely need to  schedule an ablation if she can't take her medications which needs to be scheduled. A-fib clinic would be the best thing for her at this time, which I will go ahead and schedule. As I was walking past the patient's room, her HR was in at 120, which has improved from the 150s. Troponin negative. H&H is 10.6 and 31. As some of the labs from the CMP hemolyzed, will order a repeat CMP.  4:45 PM I was notified that the patient's A-fib clinic appointment is scheduled for Monday, 12/29 at 8:00 AM. I further discussed with Cardiology making sure that this is okay and considering that this is a new problem for her, he recommends the patient be given Cardizem  in the ED. Spoke with ED Pharmacist who states it is okay to switch the patient's Cardizem  to 30 mg every Q6h so that she can crush it. Also discussed with the patient regarding her repeat EKG which shows HR 118 and ST elevation in the V1 lead. I don't see other elevations in any other leads. Patient is not having any active chest pain. She would like to leave regardless of these EKG changes and we discussed that we don't know if she is having a heart attack. Patient is fully aware and would still like to leave.  5:20 PM EKG tech notified me that the patient is refusing repeat EKG and has her coat on and is ready to leave. I will have a conversation with the patient as well as discuss her previous EKG findings. After a lengthy discussion with the patient about her repeat EKG and ST elevation,  she does not want a repeat EKG nor a repeat troponin. She understands the risk of leaving and verbally expresses understanding. I also told the patient to return to the ED if she develops chest pain or worsening symptoms. Patient is agreeable to the plan. She has afib clinic follow up and will call to change the appointment if that time doesn't work.  She was also given a prescription for carvedilol 30mg  IR Q6Hrs which she will have an easier time swallowing. Also recommended  she call her PCP.      Additional MDM Elements     Discussion with other professionals: Consultant - Cardiology  I have reviewed recent and relavant previous record, including: Outpatient notes - 11/17/2024 Hem Onc Note for chart review and past medical history  Social Determinants that significantly affected care: None Available     Portions of this record have been created using Scientist, clinical (histocompatibility and immunogenetics). Dictation errors have been sought, but may not have been identified and corrected.  See chart and nursing documentation for additional ED course details.  ____________________________________________      History     Reason for Visit Irregular Heart Beat   HPI  Sabiha Sura is a 63 y.o. female with a past medical history of cervical esophageal squamous cell carcinoma (currently receiving radiation), paroxysmal A-fib (on Eliquis ), embolic stroke, hypertension, hyperlipidemia, and bipolar disorder presenting for palpitations and shortness of breath. The patient reports shortness of breath, palpitations, and chest pressure that has been worsening over the last few days. She states her chest pressure will intermittently worsen with deep breaths. She has taken her flecainide  20 mg and metoprolol  25 mg today, however has not taken her diltiazem  120 mg due to it being a capsule thus making it difficult for her to swallow.  She also reports intermittent chest pain and headaches at baseline, which she suspects is related to her chemotherapy. Of note, [facility] her symptoms where they performed an EKG which showed A-fib with RVR and she was advised to come to the ED for further evaluation. Patient further endorses that recently she has been experiencing increased palpitations and believes she may need a cardioversion. She had an established Cardiologist at [facility] however due to them retiring earlier this year, she is in the process of  finding a new one. She states she would like to see the Cardiology team here at [facility]. Patient is also anticoagulated on Eliquis . Denies fevers, chills, cough, congestion, or skin rashes.  Outside Historian(s) (EMS, Significant Other, Family, Parent, Caregiver, Friend, Patent Examiner, etc.)  None  Past Medical History[1]  Past Surgical History[2]  No current facility-administered medications for this encounter.  Current Outpatient Medications:    ALPRAZolam (XANAX) 0.5 MG tablet, Take 1 tablet (0.5 mg total) by mouth two (2) times a day as needed. (Patient not taking: Reported on 10/05/2024), Disp: , Rfl:    alum-mag-simeth (MAALOX PLUS) 200-200-20 mg/5 mL Susp, Mix equal parts diphenhydramine , lidocaine , and liquid antacid. Swish 5 to 10mLs in mouth for 60 seconds, every 4 to 6 hours as needed, then swallow or spit mixture out (as directed by prescriber). Shake well before using. Refrigerate after mixing and discard after 14 days., Disp: 355 mL, Rfl: 0   apixaban  (ELIQUIS ) 5 mg Tab, Take 1 tablet (5 mg total) by mouth two (2) times a day., Disp: , Rfl:    clonazePAM  (KLONOPIN ) 0.5 MG tablet, Take 1 tablet (0.5 mg total) by mouth. (  Patient taking differently: Take 1 tablet (0.5 mg total) by mouth two (2) times a day as needed for anxiety.), Disp: , Rfl:    [Paused] dilTIAZem  (CARDIZEM  CD) 120 MG 24 hr capsule, , Disp: , Rfl:    dilTIAZem  (CARDIZEM ) 30 MG tablet, Take 1 tablet (30 mg total) by mouth four (4) times a day. Ok to crush, Disp: 400 tablet, Rfl: 3   diphenhydrAMINE  (BENADRYL ) 12.5 mg/5 mL liquid, Mix equal parts diphenhydramine , lidocaine , and liquid antacid. Swish 5 to 10mLs in mouth for 60 seconds, every 4 to 6 hours as needed, then swallow or spit mixture out (as directed by prescriber). Shake well before using. Refrigerate after mixing and discard after 14 days., Disp: 118 mL, Rfl: 0   flecainide  (TAMBOCOR ) 50 MG tablet, Take 1 tablet (50 mg total) by mouth. (Patient taking  differently: Take 1 tablet (50 mg total) by mouth two (2) times a day.), Disp: , Rfl:    hydrOXYzine  (ATARAX ) 10 MG tablet, Take 1 tablet (10 mg total) by mouth. (Patient taking differently: Take 1 tablet (10 mg total) by mouth every six (6) hours as needed for anxiety.), Disp: , Rfl:    lamoTRIgine  (LAMICTAL ) 100 MG tablet, Take 1 tablet (100 mg total) by mouth. (Patient taking differently: Take 1 tablet (100 mg total) by mouth two (2) times a day.), Disp: , Rfl:    lidocaine  (XYLOCAINE ) 2 % Soln, Mix equal parts diphenhydramine , lidocaine , and liquid antacid. Swish 5 to 10mLs in mouth for 60 seconds, every 4 to 6 hours as needed, then swallow or spit mixture out (as directed by prescriber). Shake well before using. Refrigerate after mixing and discard after 14 days., Disp: 100 mL, Rfl: 0   metoPROLOL  tartrate (LOPRESSOR ) 25 MG tablet, Take 1 tablet (25 mg total) by mouth. (Patient taking differently: Take 1 tablet (25 mg total) by mouth two (2) times a day.), Disp: , Rfl:    omeprazole (PRILOSEC OTC) 20 MG tablet, Take 1 tablet (20 mg total) by mouth daily. (Patient taking differently: Take 1 tablet (20 mg total) by mouth two (2) times a day.), Disp: , Rfl:    ondansetron  (ZOFRAN -ODT) 8 MG disintegrating tablet, Dissolve 1 tablet (8 mg total) in the mouth every eight (8) hours as needed for nausea. Do not take on days of chemotherapy administration, Disp: 30 tablet, Rfl: 1   polyethylene glycol (GLYCOLAX ) 17 gram/dose powder, , Disp: , Rfl:    propranolol  (INDERAL ) 10 MG tablet, Take 1 tablet (10 mg total) by mouth. (Patient taking differently: Take 1 tablet (10 mg total) by mouth daily as needed (breakthrough for afib).), Disp: , Rfl:    traZODone  (DESYREL ) 100 MG tablet, Take 1 tablet (100 mg total) by mouth. (Patient taking differently: Take 1 tablet (100 mg total) by mouth daily as needed for sleep.), Disp: , Rfl:   Allergies Iodinated contrast media, Compazine  [prochlorperazine ], Codeine,  Erythromycin, Ibuprofen, and Nsaids (non-steroidal anti-inflammatory drug)  Family History[3]  Short Social History[4]    Physical Exam   ED Triage Vitals  Enc Vitals Group     BP 11/17/24 1418 119/84     Pulse 11/17/24 1418 (!) 130     SpO2 Pulse --      Resp 11/17/24 1415 20     Temp 11/17/24 1415 36.9 C (98.4 F)     Temp Source 11/17/24 1415 Oral     SpO2 11/17/24 1418 100 %     Weight --      Height --  Constitutional: Alert and oriented. Well appearing and in no distress. Eyes: Conjunctivae are normal. ENT      Head: Normocephalic and atraumatic.      Nose: No congestion.      Mouth/Throat: Mucous membranes are moist.      Neck: No stridor. Hematological/Lymphatic/Immunilogical: No cervical lymphadenopathy. Cardiovascular: Normal rate, regular rhythm. Normal and symmetric distal pulses are present in all extremities. Respiratory: Normal respiratory effort. Breath sounds are normal. Gastrointestinal: Soft and nontender. There is no CVA tenderness. Musculoskeletal: Normal range of motion in all extremities.      Right lower leg: No tenderness or edema.      Left lower leg: No tenderness or edema. Neurologic: Normal speech and language. No gross focal neurologic deficits are appreciated. Skin: Skin is warm, dry and intact. No rash noted. Psychiatric: Mood and affect are normal. Speech and behavior are normal.   Radiology   XR Chest Portable  Final Result    No acute cardiopulmonary abnormalities.        ED POCUS    (Results Pending)     Procedures including Critical Care   None   Documentation assistance was provided by Almarie Crawley, Scribe, on November 17, 2024 at 3:27 PM for Rollo Buff, MD.  Documentation assistance was provided by the scribe in my presence.  The documentation recorded by the scribe has been reviewed by me and accurately reflects the services I personally performed.        [1] Past Medical History: Diagnosis Date    Skin cancer    Stroke    (CMS-HCC)   [2] Past Surgical History: Procedure Laterality Date   Partial Knee Replacement  2016  [3] History reviewed. No pertinent family history. [4] Social History Tobacco Use   Smoking status: Never   Smokeless tobacco: Never  Vaping Use   Vaping status: Never Used  Substance Use Topics   Alcohol use: Not Currently   Drug use: Never   Buff Rollo Avelina Zebedee, MD 11/18/24 1535

## 2024-11-18 ENCOUNTER — Encounter: Payer: Self-pay | Admitting: Cardiovascular Disease

## 2024-11-18 ENCOUNTER — Other Ambulatory Visit: Payer: Self-pay

## 2024-11-18 ENCOUNTER — Other Ambulatory Visit: Payer: Self-pay | Admitting: Family Medicine

## 2024-11-18 ENCOUNTER — Ambulatory Visit: Payer: Self-pay

## 2024-11-18 ENCOUNTER — Ambulatory Visit: Attending: Cardiovascular Disease | Admitting: Cardiovascular Disease

## 2024-11-18 ENCOUNTER — Other Ambulatory Visit (HOSPITAL_COMMUNITY): Payer: Self-pay

## 2024-11-18 VITALS — BP 106/60 | HR 110 | Ht 64.0 in | Wt 169.1 lb

## 2024-11-18 DIAGNOSIS — I341 Nonrheumatic mitral (valve) prolapse: Secondary | ICD-10-CM | POA: Diagnosis not present

## 2024-11-18 DIAGNOSIS — I4891 Unspecified atrial fibrillation: Secondary | ICD-10-CM

## 2024-11-18 DIAGNOSIS — I480 Paroxysmal atrial fibrillation: Secondary | ICD-10-CM | POA: Diagnosis not present

## 2024-11-18 MED ORDER — FLECAINIDE ACETATE 50 MG PO TABS: 75.0000 mg | ORAL_TABLET | Freq: Two times a day (BID) | ORAL | Status: DC

## 2024-11-18 MED ORDER — METOPROLOL TARTRATE 25 MG PO TABS: 37.5000 mg | ORAL_TABLET | Freq: Two times a day (BID) | ORAL | Status: DC

## 2024-11-18 NOTE — ED Notes (Signed)
 No attestation needed as I saw this patient primarily

## 2024-11-18 NOTE — Patient Instructions (Addendum)
 Cardioversion for afib  Medication Instructions:   Please increase the metoprolol  up to 37 .5 mg twice a day  Increase the flecainide  up to 75 mg twice a day   If you need a refill on your cardiac medications before your next appointment, please call your pharmacy.   Lab work: No new labs needed  Testing/Procedures:     Dear Kathleen Perkins  You are scheduled for a Cardioversion on Friday, December 26 with Dr. Darliss.  Please arrive at the [facility] Entrance of [facility], [address], [place] [address] at 12:00 PM (This is 1 hour(s) prior to your procedure time).  Proceed to the Check-In Desk directly inside the entrance.  Procedure Parking: Use the entrance off of the [address] side of the hospital. Turn right upon entering and follow the driveway to parking that is directly in front of the [facility]. There is no valet parking available at this entrance, however there is an awning directly in front of the [facility] for drop off/ pick up for patients.   DIET:  Nothing to eat or drink after midnight except a sip of water  with medications (see medication instructions below)  MEDICATION INSTRUCTIONS: !!IF ANY NEW MEDICATIONS ARE STARTED AFTER TODAY, PLEASE NOTIFY YOUR PROVIDER AS SOON AS POSSIBLE!!  FYI: Medications such as Semaglutide  (Ozempic , Wegovy ), Tirzepatide  (Mounjaro , Zepbound ), Dulaglutide (Trulicity), etc (GLP1 agonists) AND Canagliflozin (Invokana), Dapagliflozin (Farxiga), Empagliflozin (Jardiance), Ertugliflozin (Steglatro), Bexagliflozin [employer]) or any combination with one of these drugs such as Invokamet (Canagliflozin/Metformin), Synjardy (Empagliflozin/Metformin), etc (SGLT2 inhibitors) must be held around the time of a procedure. This is not a comprehensive list of all of these drugs. Please review all of your medications and talk to your provider if you take any one of these. If you are not sure, ask your  provider.        :1}Continue taking your anticoagulant (blood thinner): Apixaban  (Eliquis ).  You will need to continue this after your procedure until you are told by your provider that it is safe to stop.     FYI:  For your safety, and to allow us  to monitor your vital signs accurately during the surgery/procedure we request: If you have artificial nails, gel coating, SNS etc, please have those removed prior to your surgery/procedure. Not having the nail coverings /polish removed may result in cancellation or delay of your surgery/procedure.  You must have a responsible person to drive you home and stay in the waiting area during your procedure. Failure to do so could result in cancellation.  Bring your insurance cards.  *Special Note: Every effort is made to have your procedure done on time. Occasionally there are emergencies that occur at the hospital that may cause delays. Please be patient if a delay does occur.            Follow-Up: At [facility], you and your health needs are our priority.  As part of our continuing mission to provide you with exceptional heart care, we have created designated Provider Care Teams.  These Care Teams include your primary Cardiologist (physician) and Advanced Practice Providers (APPs -  Physician Assistants and Nurse Practitioners) who all work together to provide you with the care you need, when you need it.  You will need a follow up appointment in 1 month  Providers on your designated Care Team:   Lonni Meager, NP Bernardino Bring, PA-C Cadence [place], [place]  COVID-19 Vaccine Information can be  found at: [email or web address] For questions related to vaccine distribution or appointments, please email [email or web address] or call [phone].

## 2024-11-18 NOTE — Telephone Encounter (Signed)
 Spoke with patient about DOD slot. Patient has some radiation today at 12 pm. DOD slot for Dr Perla filled 3:20 pm.

## 2024-11-18 NOTE — Telephone Encounter (Signed)
 Pt returned call - please advise

## 2024-11-18 NOTE — Telephone Encounter (Signed)
 Please try to get her in to be seen by the DOD or an APP today or early next week.  If her symptoms worsen in the meantime, she should return to the ED.  Lonni Hanson, MD [facility]

## 2024-11-18 NOTE — Progress Notes (Signed)
 Called and spoke to Ms. Kathleen Perkins at her request. She shared about her ED visit yesterday. We discussed that next chemotherapy will be Wednesday, 12/24 as scheduled, but she will come in for radiation today. We talked about the possibility of setting up a video visit early next week prior to chemotherapy. She reports that her first chemo went well. States that they did prescribe her an inhaler but she has not picked up yet.   Ms. Kathleen Perkins is scheduled at A fib clinic on 12/29 and she asked if we might be able to help her be seen any sooner. I advised that I was happy to call. Ms. Kathleen Perkins verbalized her frustration and mentioned just going on hospice at this point because of everything that has happened. She is aware that I will update team.

## 2024-11-18 NOTE — Telephone Encounter (Signed)
 Pt calling back to let Summer know she would like all of her heart care to stay with [facility]. She says she will be looking for the c/b.

## 2024-11-18 NOTE — Telephone Encounter (Signed)
 Pt called requesting a c/b from Nurse Kaushal to discuss yesterday telephone call please advise

## 2024-11-18 NOTE — Progress Notes (Signed)
 Cardiology Office Note  Date:  11/18/2024   ID:  Kathleen Perkins, DOB [birth year], MRN [id number]  PCP:  Kathleen Jon HERO, MD   Chief Complaint  Patient presents with   Shortness of Breath/A-Fib    Patient c/o chest heaviness, shortness of breath with little to no exertion and arrhythmia. Patient was at [facility] ER in [place] with RVR. Patient is undergoing chemo/radiation for esophageal cancer.     HPI:  Kathleen Perkins is a 63 y.o. female with past medical history of: Past Medical History:  Diagnosis Date   Anxiety    Atrial fibrillation (HCC)    Cerebrovascular accident (CVA) (HCC) 08/26/2022   GERD (gastroesophageal reflux disease)    Melanoma of upper arm (HCC)    MVP (mitral valve prolapse)    Primary osteoarthritis of left knee 08/15/2014  cervical esophageal squamous cell carcinoma (currently receiving radiation), July 25 Paroxysmal atrial fibrillation on Eliquis  Embolic stroke 2023, afib Who presents for atrial fibrillation with RVR  Last seen by myself in 2016, at that time reported symptoms of palpitations and chest discomfort  Discussed prior stroke in September 2023 -Presented with difficulty talking, in the hospital noted to be in atrial fibrillation -Since recovered with little residual deficits For the past 2 years has done well taking metoprolol  tartrate 25 BID, flecainide  50 BID -Minimal symptoms from atrial fibrillation  July 2025 with dysphagia, diagnosed with esophageal cancer Reports she is scheduled for XRT 38 total, complete 7 treatments 1 chemo week ago Thursday, with prednisone  This past weekend 5 days ago with worsening symptoms of Chest  heavy, SOB,   Seen in the emergency room at [facility] yesterday November 17, 2024 Noted to be in atrial fibrillation with rapid rate Heart rate was 130 bpm Reports compliance with diltiazem  ER 120, metoprolol  tartrate 25 twice daily, flecainide  50 twice daily, Eliquis  5 twice daily  On discussion today  continues to be symptomatic, eager to have cardioversion Has appointment with [facility] EP week from Monday for consideration of ablation  EKG personally reviewed by myself on todays visit EKG Interpretation Date/Time:  Friday November 18 2024 15:44:34 EST Ventricular Rate:  110 PR Interval:    QRS Duration:  76 QT Interval:  360 QTC Calculation: 487 R Axis:   37  Text Interpretation: Atrial fibrillation with rapid ventricular response Possible Anterior infarct (cited on or before 18-Nov-2024) When compared with ECG of 18-Nov-2024 15:41, No significant change was found Confirmed by Kathleen Perkins [phone]) on 11/18/2024 4:01:04 PM    PMH:   has a past medical history of Anxiety, Atrial fibrillation (HCC), Cerebrovascular accident (CVA) (HCC) (08/26/2022), GERD (gastroesophageal reflux disease), Melanoma of upper arm (HCC), MVP (mitral valve prolapse), and Primary osteoarthritis of left knee (08/15/2014).   PSH:    Past Surgical History:  Procedure Laterality Date   ARTHROSCOPIC REPAIR ACL     BREAST BIOPSY Left 06/23/2013   u/s bx- neg   BREAST EXCISIONAL BIOPSY Left 07/14/2013   us /bx-neg   MELANOMA EXCISION     left arm.    REPLACEMENT TOTAL KNEE     left     Current Outpatient Medications  Medication Sig Dispense Refill   Alum & Mag Hydroxide-Simeth 1200-1200-120 MG/30ML SUSP Mix equal parts diphenhydramine , lidocaine , and liquid antacid. Swish 5 to 10mLs in mouth for 60 seconds, every 4 to 6 hours as needed, then swallow or spit mixture out (as directed by prescriber). Shake well before using. Refrigerate after mixing and discard after 14 days. 355  mL 0   apixaban  (ELIQUIS ) 5 MG TABS tablet Take 1 tablet (5 mg total) by mouth 2 (two) times daily. 180 tablet 3   clonazePAM  (KLONOPIN ) 0.5 MG tablet Take 1 tablet (0.5 mg total) by mouth 2 (two) times daily as needed for anxiety. 30 tablet 1   diltiazem  (CARDIZEM  CD) 120 MG 24 hr capsule Take 1 capsule (120 mg total) by mouth daily. 90  capsule 1   diltiazem  (CARDIZEM ) 30 MG tablet Take 1 tablet (30 mg total) by mouth 4 (four) times daily. Okay to Crush 400 tablet 3   diphenhydrAMINE  HCl Childrens 12.5 MG/5ML LIQD Mix equal parts diphenhydramine , lidocaine , and liquid antacid. Swish 5 to 10mLs in mouth for 60 seconds, every 4 to 6 hours as needed, then swallow or spit mixture out (as directed by prescriber). Shake well before using. Refrigerate after mixing and discard after 14 days. 118 mL 0   flecainide  (TAMBOCOR ) 50 MG tablet Take 1 tablet (50 mg total) by mouth 2 (two) times daily. May take additional tablet daily as needed for breakthrough atrial fib. 225 tablet 5   HYDROcodone -acetaminophen  (NORCO) 5-325 MG tablet Take 1 tablet by mouth at bedtime. 20 tablet 0   hydrOXYzine  (ATARAX ) 10 MG tablet Take 1 tablet (10 mg total) by mouth 3 (three) times daily as needed. 30 tablet 3   ipratropium (ATROVENT  HFA) 17 MCG/ACT inhaler Inhale 2 puffs into the lungs every 6 (six) hours as needed for wheezing. 12.9 g 0   lamoTRIgine  (LAMICTAL ) 100 MG tablet Take 1 tablet (100 mg total) by mouth 2 (two) times daily. (NDC [phone]) 180 tablet 1   lidocaine  (XYLOCAINE ) 2 % solution Mix equal parts diphenhydramine , lidocaine , and liquid antacid. Swish 5 to 10mLs in mouth for 60 seconds, every 4 to 6 hours as needed, then swallow or spit mixture out (as directed by prescriber). Shake well before using. Refrigerate after mixing and discard after 14 days. 100 mL 0   metoprolol  tartrate (LOPRESSOR ) 25 MG tablet Take 1 tablet (25 mg total) by mouth 2 (two) times daily. May take an additional tablet daily for breakthrough atrial fib [facility] [id number]) 180 tablet 1   omeprazole (PRILOSEC OTC) 20 MG tablet Take 20 mg by mouth daily.     ondansetron  (ZOFRAN -ODT) 8 MG disintegrating tablet Dissolve 1 tablet (8 mg total) in the mouth every eight (8) hours as needed for nausea. Do not take on days of chemotherapy administration 30 tablet 1   polyethylene  glycol powder (GLYCOLAX /MIRALAX ) 17 GM/SCOOP powder Take 17 g by mouth daily. Dissolve 1 capful (17g) in 4-8 ounces of liquid and take by mouth daily. 238 g 3   propranolol  (INDERAL ) 10 MG tablet Take 1 tablet (10 mg total) by mouth up to 4 (four) times daily as needed for breakthrough episodes of atrial fibrillation. 120 tablet 5   traZODone  (DESYREL ) 100 MG tablet Take 1 tablet (100 mg total) by mouth at bedtime as needed. 90 tablet 1   thiamine  (VITAMIN B-1) 100 MG tablet Take 1 tablet (100 mg total) by mouth daily. (Patient not taking: Reported on 11/18/2024)     No current facility-administered medications for this visit.     Allergies:   Ivp dye [iodinated contrast media], Azithromycin, Prochlorperazine , Codeine, Erythromycin, Ibuprofen, and Nsaids   Social History:  The patient  reports that she has never smoked. She has never used smokeless tobacco. She reports that she does not drink alcohol and does not use drugs.   Family History:  family history includes Heart attack in her brother; Heart disease in her brother and father; Hyperlipidemia in her brother and father; Hypertension in her brother and father; Lung cancer in her mother.    Review of Systems: Review of Systems  Constitutional: Negative.   HENT: Negative.    Respiratory:  Positive for shortness of breath.   Cardiovascular:  Positive for palpitations.  Gastrointestinal: Negative.   Musculoskeletal: Negative.   Neurological: Negative.   Psychiatric/Behavioral: Negative.    All other systems reviewed and are negative.    PHYSICAL EXAM: VS:  BP 106/60 (BP Location: Right Arm, Patient Position: Sitting, Cuff Size: Normal)   Pulse (!) 110   Ht 5' 4 (1.626 m)   Wt 169 lb 2 oz (76.7 kg)   SpO2 98%   BMI 29.03 kg/m  , BMI Body mass index is 29.03 kg/m. GEN: Well nourished, well developed, in no acute distress HEENT: normal Neck: no JVD, carotid bruits, or masses Cardiac: Irregularly irregular; rapid, no murmurs,  rubs, or gallops,no edema  Respiratory:  clear to auscultation bilaterally, normal work of breathing GI: soft, nontender, nondistended, + BS MS: no deformity or atrophy Skin: warm and dry, no rash Neuro:  Strength and sensation are intact Psych: euthymic mood, full affect    Recent Labs: 09/07/2024: ALT 14; BUN 7; Creatinine, Ser 1.17; Hemoglobin 13.0; Platelets 528; Potassium 4.3; Sodium 138    Lipid Panel Lab Results  Component Value Date   CHOL 211 (H) 09/07/2024   HDL 84 09/07/2024   LDLCALC 104 (H) 09/07/2024   TRIG 132 09/07/2024      Wt Readings from Last 3 Encounters:  11/18/24 169 lb 2 oz (76.7 kg)  10/06/24 171 lb 14.4 oz (78 kg)  09/22/24 170 lb 14.4 oz (77.5 kg)       ASSESSMENT AND PLAN:  Problem List Items Addressed This Visit       Cardiology Problems   Paroxysmal atrial fibrillation (HCC) - Primary   Relevant Orders   EKG 12-Lead (Completed)   Mitral valve prolapse   Relevant Orders   EKG 12-Lead (Completed)   Other Visit Diagnoses       Atrial fibrillation with RVR (HCC)       Relevant Orders   EKG 12-Lead (Completed)      Atrial fibrillation with RVR Will work with our schedulers to arrange cardioversion as soon as possible, likely early next week if this can be arranged -Recommend she keep appointment with EP week from Monday to discuss ablation - Suggest she stay on diltiazem  ER 120 daily, increase metoprolol  to tartrate up to 37.5 twice daily, flecainide  up to 75 twice daily - Blood pressure low today limiting titration of her rate control medications - She is compliant with Eliquis  5 twice daily  Esophageal cancer Undergoing XRT daily typically around 11:00 AM -Has chemo weekly, has completed 1 round    Signed, Velinda Lunger, M.D., Ph.D. [facility] [place], [place] [phone]

## 2024-11-18 NOTE — Telephone Encounter (Signed)
 Patient increasingly short of breath. Has been in afib RVR for a week. She went to ED in [place] yesterday and was in afib with RVR with a high blood pressure. She states she was not treated for the RVR and has previously been admitted to the ICU with RVR and was unable to convert to sinus rhythm. She has cancer recently diagnosed in October and cannot start treatment until she is no longer in arrhythmia and had an ablation. I told her I would reach out to Dr End about recommendations and to see if EP referral is okay to place before her appointment on 1/7 with him. She is extremely overwhelmed and scared and considers going hospice if she cannot get treatment for her cancer and she is afraid it will grow and metastasize and she will die if she doesn't get her heart taken care of.. will defer to Dr End for recommendations.

## 2024-11-21 ENCOUNTER — Ambulatory Visit: Payer: Self-pay

## 2024-11-21 ENCOUNTER — Telehealth: Payer: Self-pay | Admitting: Emergency Medicine

## 2024-11-21 ENCOUNTER — Other Ambulatory Visit: Payer: Self-pay

## 2024-11-21 NOTE — Telephone Encounter (Signed)
 Pt called to confirm the cardioversion planned on 12/26 at 1300, pt reports her blood will be collected at her scheduled appt this Wednesday, that she will continue to take her Eliquis .  Pt wants to know if she should continue to take her Flecainide , metoprolol , and Cardizem  prior to her CV. Pt advised to continue these medicines since they are rate control meds they should help with the Afib. Pt reports that she'd rather not take them prior to her CV because I don't want to waste my time going to the cardioversion if they're not going to do it because I'm not in afib and those medicines keep me out of afib... most of the time Pt is requesting that Dr Gollan advise on this medicine hold question.  Pt also wants to know if Dr Gollan will prescribe a medicine that I spoke to him about on Friday... I don't remember the name of it but it increases thyroid or regulates it. I don't know. The radiation has killed my thyroid so I don't have one anymore. Pt unable to provide further details of the medicine.   Pt denies having a endocrinologist.  Will route to PCP as well.

## 2024-11-21 NOTE — Progress Notes (Signed)
 Called [facility] A fib clinic to check for earlier appointment for Ms. Kathleen Perkins. There is an appointment on 12/29 @ 8:00 am. Sent MyChart message to see if Kathleen Perkins would like to come 1 day earlier.

## 2024-11-22 ENCOUNTER — Ambulatory Visit: Payer: Self-pay

## 2024-11-22 ENCOUNTER — Other Ambulatory Visit: Payer: Self-pay

## 2024-11-22 MED ORDER — OXYCODONE HCL 5 MG/5ML PO SOLN
5.0000 mg | Freq: Three times a day (TID) | ORAL | 0 refills | Status: DC | PRN
  Filled 2024-11-22: qty 150, 10d supply, fill #0

## 2024-11-22 MED ORDER — NALOXONE HCL 4 MG/0.1ML NA LIQD
NASAL | 0 refills | Status: AC
  Filled 2024-11-22: qty 2, 1d supply, fill #0

## 2024-11-22 NOTE — Progress Notes (Signed)
 "  Patient Name: Kathleen Perkins Patient Age: 63 y.o. Encounter Date: 11/22/2024  Interventional Radiology APP: Lucienne Earnie Bloch, [place]  Referring Physician: Keane Franky Blunt, MD [address] [place],  [place] [address] Primary Care Provider: Myrla Jon Earnie, MD  SUBJECTIVE    Reason for Visit: Long-term enteric access for supplemental nutrition and hydration  History of Present Illness: Kathleen Perkins is a 63 y.o. female referred for evaluation of need for percutaneous enteric feeding tube placement. Patient has a history of esophageal cancer with severe dysphagia. She has had a 5lb weight loss over the past month. She is eating/drinking high calorie foods when she can to maintain her weight. She is doing her best to maintain her hydration status but struggles with the severe pain she has with swallowing. Throat pain gets as bad as a 10/10. She occasionally has nausea. No vomiting. She does have reflux that is uncomfortable. She thinks she will need to have G-tube placed in the next 2-3 weeks. She is nervous about holding her eliquis  and would like to discuss anticoagulation hold with her cardiologist.   Review of Systems: Pertinent positives and negatives are noted in the HPI above. Review of systems otherwise negative.  Past Medical History[1]  Past Surgical History[2]  Family History[3]   Allergies: Allergies[4]   Anticoagulant/Antiplatelet Medications: Eliquis  (apixaban )  OBJECTIVE   Physical Exam: Vitals:   11/22/24 1318  BP: 105/72  Pulse: 67  Temp: 36.2 C (97.2 F)  SpO2: 98%   Body mass index is 28.84 kg/m.  General: alert, no apparent distress Respiratory: non-labored breathing on room air Cardiovascular: regular rate Neurologic: grossly normal  ASA Score: ASA 3 - Patient with moderate systemic disease with functional limitations  Airway assessment: Class 2 - Can visualize soft palate and fauces, tip of uvula is obscured  Pertinent  Laboratory Values: Lab Results  Component Value Date   WBC 6.3 11/17/2024   HGB 10.6 (L) 11/17/2024   HCT 31.8 (L) 11/17/2024   PLT 544 (H) 11/17/2024   Lab Results  Component Value Date   INR 1.25 11/17/2024    Recent labs were reviewed by me personally.  Pertinent Imaging Studies: imaging from CT was reviewed by me personally. Interpretation: appropriate gastric window for percutaneous gastrostomy tube placement.    ASSESSMENT/PLAN   Kathleen Perkins is a 63 y.o. female with esophageal cancer and severe dysphagia, requiring long-term enteric access for nutrition.  I discussed the various treatment options available. At this time, we feel that the patient would benefit most from gastrostomy tube placement.   --Procedure will be scheduled at the earliest mutually available date and time --This is a high bleeding risk procedure.  INR <= 1.8 Platelets >= 50 Eliquis  should be held for 4 doses (CrCl >/= 50 mL/min) or 6 doses (CrCl < 30-50 mL/min) and can be resumed 24 hours after the procedure. Please notify the VIR team if starting new anticoagulation after our initial evaluation and assessment. --Patient is going to reach out to her cardiologist to discuss holding eliquis  prior to scheduling G-tube procedure --Anticoagulant medication hold required: Yes. --Anticoagulant restart: 24 hrs after procedure --Planned level of sedation: Moderate sedation --Labs needed: No.  Informed consent obtained: YES This procedure and sedation have been fully reviewed with the patient/patients authorized representative. The risks, benefits and alternatives including but not limited to infection, bleeding, bowel injury, damage to adjacent structures have been explained, and the patient/patients authorized representative has consented to the procedure. The patient's questions were all  answered to her satisfaction.  --The patient will accept blood products in an emergent situation. --The patient does  not have a Do Not Resuscitate order in effect.  Consent obtained by Lucienne Earnie Bloch, PA, witnessed, and scanned into the medical record.    This visit was conducted in person.  Lucienne Earnie Bloch, [place] 11/22/2024 1:56 PM        [1] Past Medical History: Diagnosis Date   Skin cancer    Stroke    (CMS-HCC)   [2] Past Surgical History: Procedure Laterality Date   Partial Knee Replacement  2016  [3] History reviewed. No pertinent family history. [4] Allergies Allergen Reactions   Iodinated Contrast Media Shortness Of Breath    Patient had wheezing and hypoxia after returning from IV dye CT   Compazine  [Prochlorperazine ] Other (See Comments)    Cardiovascular symptoms and blackout/syncope   Codeine Nausea Only   Erythromycin Nausea Only   Ibuprofen Nausea Only    Gi bleed   Nsaids (Non-Steroidal Anti-Inflammatory Drug) Other (See Comments)    Gi Bleed  "

## 2024-11-22 NOTE — Progress Notes (Signed)
 Patient arrived at the clinic today for GT placement consultation with the VIR provider APP. Upon assessment, the patient's vital signs were stable (VSS), and the patient was alert and oriented x4. Allergies and home medications were reviewed and documented accurately. A consent form is available at the bedside. If the patient decides to proceed with the procedure recommended by the VIR provider, the RN will verify and sign the consent, which will then be scanned into the patient's chart. The RN provided the patient with education and reading materials related to the procedure to ensure the patient is informed and prepared. All questions addressed accordingly. The clinic contact number was offered to the patient for any future inquiries. An After Visit Summary (AVS) was printed and offered to the patient for their reference.

## 2024-11-23 NOTE — Telephone Encounter (Signed)
 Called to speak with pt regarding medications to take prior to cardioversion on Friday 12/26; verified identity using two identifiers; I advised pt to continue taking all of her cardiac medications prior to the Cardioversion; pt asked if she should cancel her appointment because she is in and out of Afib/NSR; I advised her to keep her appointment since she does go in and out of rhythm; however, I will get confirmation and will call her back; pt also needs a G-tube placed as soon as possible due to difficulty swallowing; she states that she will need to be off of Eliquis  for the procedure before and after placement; she has concerns because she is in radiation everyday and she doesn't want to throw a clot due to stopping the Eliquis .  I will obtain confirmation from DOD today due to pt's cardiologist being out of the office today and reassured her I would get back with her as soon as I hear back regarding Eliquis .  During the conversation pt became upset because she thought that I laughed when she said she was in sinus rhythm;  however, I explained that I did not laugh that I cleared my throat; I explained that I would never laugh at any cardiac or any other medical condition.  Pt states she is going through a lot with her cancer.  Will update pt with answers to her questions/concerns as soon as I obtain confirmation from Dr. Gollan or Dr. Darron who is our  doctor of the day on 12/24.

## 2024-11-23 NOTE — Telephone Encounter (Signed)
 Called the patient to advise her to come to the medical mall for an EKG. She stated that she was currently getting radiation and would ask for the cancer center to perform an EKG. She will also be having  radiation the morning of her cardioversion and will ask them to do an EKG at the time as well. She has been advised that we have a on call provider if she needs anything while we are closed for the holiday. We will reopen on Friday morning.

## 2024-11-25 ENCOUNTER — Telehealth: Payer: Self-pay | Admitting: Emergency Medicine

## 2024-11-25 ENCOUNTER — Encounter: Admission: RE | Payer: Self-pay | Source: Home / Self Care

## 2024-11-25 ENCOUNTER — Ambulatory Visit: Admission: RE | Admit: 2024-11-25 | Payer: Self-pay | Source: Home / Self Care | Admitting: Cardiology

## 2024-11-25 SURGERY — CARDIOVERSION
Anesthesia: General

## 2024-11-25 NOTE — Telephone Encounter (Signed)
 Called the patient to confirm that she wants to cancel the cardioversion.  Phone not answered, voicemail left asking her to call back.  Callback number provided.

## 2024-11-25 NOTE — Telephone Encounter (Signed)
 Answered an incoming call from the patient.  Patient confirms wanting to cancel the cardioversion scheduled for today at 12:00.  That procedure was already cancelled when this RN went to cancel the procedure appointment.

## 2024-11-26 ENCOUNTER — Other Ambulatory Visit: Payer: Self-pay | Admitting: Cardiovascular Disease

## 2024-11-26 ENCOUNTER — Other Ambulatory Visit: Payer: Self-pay

## 2024-11-26 DIAGNOSIS — I480 Paroxysmal atrial fibrillation: Secondary | ICD-10-CM

## 2024-11-28 ENCOUNTER — Other Ambulatory Visit: Payer: Self-pay

## 2024-11-28 MED ORDER — FLECAINIDE ACETATE 50 MG PO TABS
75.0000 mg | ORAL_TABLET | Freq: Two times a day (BID) | ORAL | 1 refills | Status: AC
  Filled 2024-11-28: qty 270, 90d supply, fill #0
  Filled 2024-11-30: qty 120, 40d supply, fill #0

## 2024-11-28 MED ORDER — METOPROLOL TARTRATE 25 MG PO TABS
ORAL_TABLET | ORAL | 1 refills | Status: AC
  Filled 2024-11-28: qty 270, fill #0

## 2024-11-28 NOTE — Telephone Encounter (Signed)
 Oncology is likely monitoring thyroid, but happy to see her to eval and discuss if she has concerns.  We will follow up at her next visit if needed. Does not need Endo referral at this time.

## 2024-11-30 ENCOUNTER — Other Ambulatory Visit: Payer: Self-pay

## 2024-11-30 MED ORDER — OXYCODONE HCL 5 MG/5ML PO SOLN
5.0000 mg | Freq: Four times a day (QID) | ORAL | 0 refills | Status: DC | PRN
  Filled 2024-11-30: qty 200, 10d supply, fill #0

## 2024-12-06 ENCOUNTER — Other Ambulatory Visit: Payer: Self-pay

## 2024-12-06 MED ORDER — CETIRIZINE HCL 10 MG PO TABS
10.0000 mg | ORAL_TABLET | ORAL | 0 refills | Status: AC
  Filled 2024-12-06: qty 1, 1d supply, fill #0

## 2024-12-06 MED ORDER — PREDNISONE 50 MG PO TABS
ORAL_TABLET | ORAL | 0 refills | Status: AC
  Filled 2024-12-06: qty 3, 1d supply, fill #0

## 2024-12-07 ENCOUNTER — Telehealth: Payer: Self-pay | Admitting: Cardiovascular Disease

## 2024-12-07 ENCOUNTER — Ambulatory Visit: Admitting: Internal Medicine

## 2024-12-07 NOTE — Telephone Encounter (Signed)
 Called and spoke with the patient.  Patient states having hypotension and having to sit down often while doing regular household work.  Patient states staying well hydrated and being medication compliant, with the exception of holding her Metoprolol  sometimes due to blood pressure being too low.  Patient states an appointment today for cancer treatment in [facility].  Offered patient an office visit today with Tylene Lunch, NP.  Patient refused, stating she will only see Dr. Gollan.  Informed the patient that Dr. Gollan's first available is the week of 12/12/24.  Patient states she will call back to see if she wants to make an earlier appointment.  Patient expressed gratitude for the call.  Patient made aware of going to the emergency room for worsening symptoms of lightheadedness, dizziness, syncopal episode, or developing chest pain.  Patient verbalized agreement.

## 2024-12-07 NOTE — Telephone Encounter (Signed)
 Pt c/o BP issue: STAT if pt c/o blurred vision, one-sided weakness or slurred speech  1. What are your last 5 BP readings? 100/60; 100/58  2. Are you having any other symptoms (ex. Dizziness, headache, blurred vision, passed out)? Dizziness (had to sit down in shower)  3. What is your BP issue?

## 2024-12-09 ENCOUNTER — Other Ambulatory Visit: Payer: Self-pay

## 2024-12-09 ENCOUNTER — Other Ambulatory Visit: Payer: Self-pay | Admitting: Family Medicine

## 2024-12-09 NOTE — Telephone Encounter (Unsigned)
 Copied from CRM [id number]. Topic: Clinical - Medication Refill >> Dec 09, 2024 12:52 PM Zebedee SAUNDERS wrote: Medication: traZODone  (DESYREL ) 100 MG tablet  Has the patient contacted their pharmacy? Yes (Agent: If no, request that the patient contact the pharmacy for the refill. If patient does not wish to contact the pharmacy document the reason why and proceed with request.) (Agent: If yes, when and what did the pharmacy advise?)  This is the patient's preferred pharmacy:  [facility] [address] [place] [address] Phone: [phone] Fax: [phone]  Is this the correct pharmacy for this prescription? Yes If no, delete pharmacy and type the correct one.   Has the prescription been filled recently? Yes  Is the patient out of the medication? Yes  Has the patient been seen for an appointment in the last year OR does the patient have an upcoming appointment? Yes  Can we respond through MyChart? Yes  Agent: Please be advised that Rx refills may take up to 3 business days. We ask that you follow-up with your pharmacy.

## 2024-12-12 NOTE — Telephone Encounter (Signed)
 Requested medications are due for refill today.  Should not need - too soon  Requested medications are on the active medications list.  yes  Last refill. 07/26/2024 #90 1 rf  Future visit scheduled.   yes  Notes to clinic.  Med list states that pt no longer taking.    Requested Prescriptions  Pending Prescriptions Disp Refills   traZODone  (DESYREL ) 100 MG tablet 90 tablet 1    Sig: Take 1 tablet (100 mg total) by mouth at bedtime as needed.     Psychiatry: Antidepressants - Serotonin Modulator Passed - 12/12/2024 11:33 AM      Passed - Valid encounter within last 6 months    Recent Outpatient Visits           2 months ago Immunization due   [facility] [place], Jon HERO, MD   2 months ago Esophageal mass   [facility] [place], Jon HERO, MD   3 months ago Esophageal dysphagia   [facility] [place], Jon HERO, MD   11 months ago Encounter for annual physical exam   [facility] [place], Jon HERO, MD       Future Appointments             In 1 week Gollan, Timothy J, MD [facility]

## 2024-12-13 ENCOUNTER — Other Ambulatory Visit: Payer: Self-pay | Admitting: Family Medicine

## 2024-12-13 ENCOUNTER — Ambulatory Visit: Payer: Self-pay

## 2024-12-13 ENCOUNTER — Other Ambulatory Visit: Payer: Self-pay

## 2024-12-13 MED ORDER — TRAZODONE HCL 100 MG PO TABS
100.0000 mg | ORAL_TABLET | Freq: Every evening | ORAL | 1 refills | Status: AC | PRN
  Filled 2024-12-13: qty 30, 30d supply, fill #0

## 2024-12-13 NOTE — Telephone Encounter (Signed)
 FYI Only or Action Required?: Action required by provider: clinical question for provider and update on patient condition.  Patient was last seen in primary care on 10/06/2024 by Myrla Jon HERO, MD.  Called Nurse Triage reporting Medication Problem.  Symptoms began since use of trazodone .  Interventions attempted: OTC medications: 1000 mg tylenol  2x/day instead of trazodone .  Symptoms are: unchanged.  Triage Disposition: Call PCP Now  Patient/caregiver understands and will follow disposition?: Yes       Copied from CRM [id number]. Topic: Clinical - Medical Advice >> Dec 13, 2024  2:25 PM Amy B wrote: Reason for CRM: Patient states that tramadol does nothing for her and would like an alternative medication prescribed.  She requests a call back to discuss [phone]. Reason for Disposition  [1] Caller has URGENT medicine question about med that primary care doctor (or NP/PA) or specialist prescribed AND [2] triager unable to answer question  Answer Assessment - Initial Assessment Questions Pt requesting alternative to trazodone  to help her sleep, pharmacist recommended ambien or lunesta. Pt requesting feedback and meds specifically from Dr. Myrla who knows her history well. Advised pt call back or seek immediate care if any new or worsening symptoms. Sending message to PCP office for call back to pt with further recommendations.    Pt reporting trazodone  doesn't help with sleep just makes her feel bad especially the next day  Been taking 1000 mg liquid tylenol  2x/day instead  Swallowing also an issue with throat pain from radiation and trazodone  is a pill, pt been emptying capsules or chewing meds for each med she has  Pt reporting she has no other symptoms to discuss at this time, got a fleet of doctors, but just need to sleep to heal.  Protocols used: Medication Question Call-A-AH

## 2024-12-14 ENCOUNTER — Other Ambulatory Visit: Payer: Self-pay

## 2024-12-14 MED ORDER — SUCRALFATE 1 G PO TABS
1.0000 g | ORAL_TABLET | Freq: Four times a day (QID) | ORAL | 1 refills | Status: AC
  Filled 2024-12-14: qty 120, 30d supply, fill #0

## 2024-12-14 MED ORDER — SUCRALFATE 1 GM/10ML PO SUSP: ORAL | 11 refills | Status: AC

## 2024-12-15 ENCOUNTER — Other Ambulatory Visit: Payer: Self-pay

## 2024-12-15 ENCOUNTER — Telehealth: Payer: Self-pay

## 2024-12-15 MED ORDER — OXYCODONE HCL 5 MG/5ML PO SOLN
5.0000 mg | Freq: Four times a day (QID) | ORAL | 0 refills | Status: AC | PRN
  Filled 2024-12-15: qty 140, 7d supply, fill #0

## 2024-12-15 MED ORDER — CLONAZEPAM 0.5 MG PO TABS
0.5000 mg | ORAL_TABLET | Freq: Two times a day (BID) | ORAL | 1 refills | Status: AC | PRN
  Filled 2024-12-15: qty 30, 15d supply, fill #0
  Filled 2025-01-06: qty 30, 15d supply, fill #1

## 2024-12-15 NOTE — Telephone Encounter (Signed)
 Copied from CRM [id number]. Topic: Clinical - Prescription Issue >> Dec 08, 2024  3:06 PM Kathleen Perkins wrote: Reason for CRM: Pt called reporting that she is taking tramadol for her cancer, she reports that she is not sleeping because of this medication.   [facility] [address] [place] [address] Phone: [phone] Fax: [phone]  Best contact: [phone]  Says she has not been sleeping for a month, she has radiation daily, she has chemo weekly. >> Dec 15, 2024  1:13 PM Kathleen Perkins wrote: Pt has called in prev times adivisng needing medication for sleep and does not want to take Trazadone. She is currently in chemo therapy and radiation and is needing medication for sleep. Please advise pt on #[phone]

## 2024-12-15 NOTE — Telephone Encounter (Signed)
 Cannot take Lunesta or Ambien with her clonazepam .  Happy to have a virtual appt with her to discuss.

## 2024-12-15 NOTE — Telephone Encounter (Signed)
 See previous phone note where I suggested appt

## 2024-12-16 ENCOUNTER — Other Ambulatory Visit: Payer: Self-pay

## 2024-12-16 ENCOUNTER — Telehealth: Payer: Self-pay

## 2024-12-16 MED ORDER — MOMETASONE FUROATE 0.1 % EX CREA
TOPICAL_CREAM | Freq: Two times a day (BID) | CUTANEOUS | 3 refills | Status: AC
  Filled 2024-12-16: qty 45, 30d supply, fill #0

## 2024-12-16 NOTE — Telephone Encounter (Unsigned)
 Copied from CRM [id number]. Topic: Appointments - Scheduling Inquiry for Clinic >> Dec 16, 2024 12:16 PM Kathleen Perkins wrote: Reason for CRM: patient would like to schedule a virtual with her pcp, I dont see any sooner appts. Please fu with patient. Her pcp advised her of this appt

## 2024-12-17 ENCOUNTER — Other Ambulatory Visit: Payer: Self-pay

## 2024-12-17 MED FILL — Hydroxyzine HCl Tab 10 MG: ORAL | 10 days supply | Qty: 30 | Fill #2 | Status: CN

## 2024-12-19 NOTE — Telephone Encounter (Signed)
 This is duplicate.  This has already been addressed in another encounter.

## 2024-12-19 NOTE — Progress Notes (Unsigned)
 Cardiology Office Note  Date:  12/20/2024   ID:  Kathleen Perkins, DOB [birth year], MRN [id number]  PCP:  Myrla Jon HERO, MD   Chief Complaint  Patient presents with   1 month follow up     Patient c/o shortness of breath and A-Fib with RVR.     HPI:  Kathleen Perkins is a 64 y.o. female with past medical history of: Past Medical History:  Diagnosis Date   Anxiety    Atrial fibrillation (HCC)    Cerebrovascular accident (CVA) (HCC) 08/26/2022   GERD (gastroesophageal reflux disease)    Melanoma of upper arm (HCC)    MVP (mitral valve prolapse)    Primary osteoarthritis of left knee 08/15/2014  cervical esophageal squamous cell carcinoma (currently receiving radiation), July 25 Paroxysmal atrial fibrillation on Eliquis  Embolic stroke 2023, afib Who presents for atrial fibrillation with RVR  LOV 11/18/24 On that visit her flecainide  and metoprolol  dosing were increased Seen in the emergency room [facility] November 29, 2024  Seen by EP, scheduled for ablation beginning of February 2026 She does not feel that she is ready to do this given ulcerations in her throat from chemo and radiation  Has taken herself off metoprolol  and diltiazem  secondary to hypotension Weight loss over the past several months, 10 pounds in the past 3 months  Thinks that she would like to delay the ablation until her throat has stabilized  She has continued on flecainide  75 twice daily  EKG personally reviewed by myself on todays visit EKG Interpretation Date/Time:  Tuesday December 20 2024 14:14:21 EST Ventricular Rate:  74 PR Interval:  176 QRS Duration:  76 QT Interval:  418 QTC Calculation: 463 R Axis:   35  Text Interpretation: Normal sinus rhythm Nonspecific ST abnormality When compared with ECG of 18-Nov-2024 15:44, Sinus rhythm has replaced Atrial fibrillation Vent. rate has decreased BY  36 BPM Confirmed by Perla Lye [phone]) on 12/20/2024 2:33:13 PM   Other past medical history  reviewed July 2025 with dysphagia, diagnosed with esophageal cancer Reports she is scheduled for XRT 38 total, complete 7 treatments 1 chemo week ago Thursday, with prednisone   Se[facility] November 17, 2024 Noted to be in atrial fibrillation with rapid rate Heart rate was 130 bpm Reports compliance with diltiazem  ER 120, metoprolol  tartrate 25 twice daily, flecainide  50 twice daily, Eliquis  5 twice daily   PMH:   has a past medical history of Anxiety, Atrial fibrillation (HCC), Cerebrovascular accident (CVA) (HCC) (08/26/2022), GERD (gastroesophageal reflux disease), Melanoma of upper arm (HCC), MVP (mitral valve prolapse), and Primary osteoarthritis of left knee (08/15/2014).   PSH:    Past Surgical History:  Procedure Laterality Date   ARTHROSCOPIC REPAIR ACL     BREAST BIOPSY Left 06/23/2013   u/s bx- neg   BREAST EXCISIONAL BIOPSY Left 07/14/2013   us /bx-neg   MELANOMA EXCISION     left arm.    REPLACEMENT TOTAL KNEE     left     Current Outpatient Medications  Medication Sig Dispense Refill   apixaban  (ELIQUIS ) 5 MG TABS tablet Take 1 tablet (5 mg total) by mouth 2 (two) times daily. 180 tablet 3   cetirizine  (ZYRTEC ) 10 MG tablet Take 1 tablet (10 mg) by mouth 1 hour prior to contrast. 1 tablet 0   clonazePAM  (KLONOPIN ) 0.5 MG tablet Take 1 tablet (0.5 mg total) by mouth 2 (two) times daily as needed for anxiety. 30 tablet 1  diltiazem  (CARDIZEM ) 30 MG tablet Take 1 tablet (30 mg total) by mouth 4 (four) times daily. Okay to Crush 400 tablet 3   diphenhydrAMINE  HCl Childrens 12.5 MG/5ML LIQD Mix equal parts diphenhydramine , lidocaine , and liquid antacid. Swish 5 to 10mLs in mouth for 60 seconds, every 4 to 6 hours as needed, then swallow or spit mixture out (as directed by prescriber). Shake well before using. Refrigerate after mixing and discard after 14 days. 118 mL 0   flecainide  (TAMBOCOR ) 50 MG tablet Take 1.5 tablets (75 mg total) by  mouth 2 (two) times daily. 270 tablet 1   hydrOXYzine  (ATARAX ) 10 MG tablet Take 1 tablet (10 mg total) by mouth 3 (three) times daily as needed. 30 tablet 3   ipratropium (ATROVENT  HFA) 17 MCG/ACT inhaler Inhale 2 puffs into the lungs every 6 (six) hours as needed for wheezing. 12.9 g 0   lamoTRIgine  (LAMICTAL ) 100 MG tablet Take 1 tablet (100 mg total) by mouth 2 (two) times daily. (NDC [phone]) 180 tablet 1   lidocaine  (XYLOCAINE ) 2 % solution Mix equal parts diphenhydramine , lidocaine , and liquid antacid. Swish 5 to 10mLs in mouth for 60 seconds, every 4 to 6 hours as needed, then swallow or spit mixture out (as directed by prescriber). Shake well before using. Refrigerate after mixing and discard after 14 days. 100 mL 0   mometasone  (ELOCON ) 0.1 % cream Apply to affected area twice daily 45 g 3   naloxone  (NARCAN ) nasal spray 4 mg/0.1 mL 1 spray (4 mg total) into alternating nostrils once as needed for up to 1 dose. One spray in either nostril once for known/suspected opioid overdose. May repeat every 2-3 minutes in alternating nostril til EMS arrives 2 each 0   OMEPRAZOLE MAGNESIUM  PO Take 20 mg by mouth in the morning and at bedtime. Disintegrating tablets     ondansetron  (ZOFRAN -ODT) 8 MG disintegrating tablet Dissolve 1 tablet (8 mg total) in the mouth every eight (8) hours as needed for nausea. Do not take on days of chemotherapy administration 30 tablet 1   oxyCODONE  (ROXICODONE ) 5 MG/5ML solution Take 5 mL (5 mg total) by mouth every six (6) hours as needed for pain. 200 mL 0   polyethylene glycol powder (GLYCOLAX /MIRALAX ) 17 GM/SCOOP powder Take 17 g by mouth daily. Dissolve 1 capful (17g) in 4-8 ounces of liquid and take by mouth daily. 238 g 3   propranolol  (INDERAL ) 10 MG tablet Take 1 tablet (10 mg total) by mouth up to 4 (four) times daily as needed for breakthrough episodes of atrial fibrillation. 120 tablet 5   sucralfate  (CARAFATE ) 1 g tablet Take 1 tablet (1 g total) by mouth  4 (four) times daily with a meal and nightly. 120 tablet 1   sucralfate  (CARAFATE ) 1 GM/10ML suspension Take 10 mL (1 g total) by mouth Four (4) times a day with a meal and nightly. 1200 mL 11   traZODone  (DESYREL ) 100 MG tablet Take 1 tablet (100 mg total) by mouth at bedtime as needed. 90 tablet 1   Alum & Mag Hydroxide-Simeth 1200-1200-120 MG/30ML SUSP Mix equal parts diphenhydramine , lidocaine , and liquid antacid. Swish 5 to 10mLs in mouth for 60 seconds, every 4 to 6 hours as needed, then swallow or spit mixture out (as directed by prescriber). Shake well before using. Refrigerate after mixing and discard after 14 days. (Patient not taking: Reported on 12/20/2024) 355 mL 0   diltiazem  (CARDIZEM  CD) 120 MG 24 hr capsule Take 1 capsule (120 mg  total) by mouth daily. (Patient not taking: Reported on 12/20/2024) 90 capsule 1   metoprolol  tartrate (LOPRESSOR ) 25 MG tablet Take 1.5 tablets by mouth twice daily (Patient not taking: Reported on 12/20/2024) 270 tablet 1   predniSONE  (DELTASONE ) 50 MG tablet Take 1 tablet (50 mg) by mouth 13 hours, 7 hours and 1 hour before contrast. (Patient not taking: Reported on 12/20/2024) 3 tablet 0   thiamine  (VITAMIN B-1) 100 MG tablet Take 1 tablet (100 mg total) by mouth daily. (Patient not taking: Reported on 12/20/2024)     No current facility-administered medications for this visit.     Allergies:   Ivp dye [iodinated contrast media], Prochlorperazine , Ibuprofen, and Nsaids   Social History:  The patient  reports that she has never smoked. She has never used smokeless tobacco. She reports that she does not drink alcohol and does not use drugs.   Family History:   family history includes Heart attack in her brother; Heart disease in her brother and father; Hyperlipidemia in her brother and father; Hypertension in her brother and father; Lung cancer in her mother.    Review of Systems: Review of Systems  Constitutional: Negative.   HENT: Negative.     Respiratory:  Positive for shortness of breath.   Cardiovascular:  Positive for palpitations.  Gastrointestinal: Negative.   Musculoskeletal: Negative.   Neurological: Negative.   Psychiatric/Behavioral: Negative.    All other systems reviewed and are negative.    PHYSICAL EXAM: VS:  BP 100/60 (BP Location: Left Arm, Patient Position: Sitting, Cuff Size: Normal)   Pulse 74   Ht 5' 4 (1.626 m)   Wt 164 lb 8 oz (74.6 kg)   SpO2 97%   BMI 28.24 kg/m  , BMI Body mass index is 28.24 kg/m. Constitutional:  oriented to person, place, and time. No distress.  HENT:  Head: Normocephalic and atraumatic.  Eyes:  no discharge. No scleral icterus.  Neck: Normal range of motion. Neck supple. No JVD present.  Cardiovascular: Normal rate, regular rhythm, normal heart sounds and intact distal pulses. Exam reveals no gallop and no friction rub. No edema No murmur heard. Pulmonary/Chest: Effort normal and breath sounds normal. No stridor. No respiratory distress.  no wheezes.  no rales.  no tenderness.  Abdominal: Soft.  no distension.  no tenderness.  Musculoskeletal: Normal range of motion.  no  tenderness or deformity.  Neurological:  normal muscle tone. Coordination normal. No atrophy Skin: Skin is warm and dry. No rash noted. not diaphoretic.  Psychiatric:  normal mood and affect. behavior is normal. Thought content normal.   Recent Labs: 09/07/2024: ALT 14; BUN 7; Creatinine, Ser 1.17; Hemoglobin 13.0; Platelets 528; Potassium 4.3; Sodium 138    Lipid Panel Lab Results  Component Value Date   CHOL 211 (H) 09/07/2024   HDL 84 09/07/2024   LDLCALC 104 (H) 09/07/2024   TRIG 132 09/07/2024      Wt Readings from Last 3 Encounters:  12/20/24 164 lb 8 oz (74.6 kg)  11/18/24 169 lb 2 oz (76.7 kg)  10/06/24 171 lb 14.4 oz (78 kg)     ASSESSMENT AND PLAN:  Problem List Items Addressed This Visit       Cardiology Problems   Hyperlipidemia   Paroxysmal atrial fibrillation (HCC) -  Primary   Relevant Orders   EKG 12-Lead (Completed)   Hypertension   Relevant Orders   EKG 12-Lead (Completed)   Atrial fibrillation with RVR Seen 1 month ago in A-fib, converted  on her own with higher dose flecainide  75 twice daily -She has stopped diltiazem  and metoprolol  given hypotension and orthostasis symptoms -Recommend she at least try propranolol  10 twice daily with her flecainide  - She is compliant with Eliquis  5 twice daily - Planning for ablation at [facility] once her throat symptoms have improved  Esophageal cancer Undergoing XRT daily typically around 11:00 AM -Has chemo weekly, has completed 1 round  Hypotension Likely exacerbated by recent chemo and radiation of throat, rapid weight loss over the past 6 months Reports she used to be 190 pounds now down to 160 -Recommend she take midodrine  10 mg 3 times daily as needed for orthostasis symptoms   Signed, Velinda Lunger, M.D., Ph.D. [facility] [place], [place] [phone]

## 2024-12-20 ENCOUNTER — Ambulatory Visit: Payer: Self-pay | Attending: Cardiovascular Disease | Admitting: Cardiovascular Disease

## 2024-12-20 ENCOUNTER — Other Ambulatory Visit: Payer: Self-pay

## 2024-12-20 ENCOUNTER — Encounter: Payer: Self-pay | Admitting: Cardiovascular Disease

## 2024-12-20 VITALS — BP 100/60 | HR 74 | Ht 64.0 in | Wt 164.5 lb

## 2024-12-20 DIAGNOSIS — E782 Mixed hyperlipidemia: Secondary | ICD-10-CM

## 2024-12-20 DIAGNOSIS — I10 Essential (primary) hypertension: Secondary | ICD-10-CM | POA: Diagnosis not present

## 2024-12-20 DIAGNOSIS — I480 Paroxysmal atrial fibrillation: Secondary | ICD-10-CM | POA: Diagnosis not present

## 2024-12-20 MED ORDER — MIDODRINE HCL 10 MG PO TABS
10.0000 mg | ORAL_TABLET | Freq: Three times a day (TID) | ORAL | 2 refills | Status: AC | PRN
  Filled 2024-12-20: qty 90, 30d supply, fill #0

## 2024-12-20 NOTE — Patient Instructions (Addendum)
 Medication Instructions:   Midodrine  10 mg three times a  day as needed for low pressure   Take propranolol  10 mg twice a day  If you need a refill on your cardiac medications before your next appointment, please call your pharmacy.   Lab work: No new labs needed  Testing/Procedures: No new testing needed  Follow-Up: At [facility], you and your health needs are our priority.  As part of our continuing mission to provide you with exceptional heart care, we have created designated Provider Care Teams.  These Care Teams include your primary Cardiologist (physician) and Advanced Practice Providers (APPs -  Physician Assistants and Nurse Practitioners) who all work together to provide you with the care you need, when you need it.  You will need a follow up appointment in 6 months  Providers on your designated Care Team:   Lonni Meager, NP Bernardino Bring, PA-C Cadence [place], [place]  COVID-19 Vaccine Information can be found at: [email or web address] For questions related to vaccine distribution or appointments, please email [email or web address] or call [phone].

## 2024-12-21 ENCOUNTER — Other Ambulatory Visit: Payer: Self-pay

## 2024-12-22 ENCOUNTER — Other Ambulatory Visit: Payer: Self-pay

## 2024-12-23 ENCOUNTER — Other Ambulatory Visit: Payer: Self-pay

## 2024-12-24 ENCOUNTER — Other Ambulatory Visit: Payer: Self-pay

## 2025-01-02 ENCOUNTER — Other Ambulatory Visit: Payer: Self-pay

## 2025-01-02 MED ORDER — OXYCODONE HCL 5 MG/5ML PO SOLN
5.0000 mg | Freq: Four times a day (QID) | ORAL | 0 refills | Status: AC | PRN
  Filled 2025-01-06: qty 140, 7d supply, fill #0

## 2025-01-05 ENCOUNTER — Other Ambulatory Visit: Payer: Self-pay | Admitting: Cardiovascular Disease

## 2025-01-05 ENCOUNTER — Other Ambulatory Visit: Payer: Self-pay

## 2025-01-05 DIAGNOSIS — I480 Paroxysmal atrial fibrillation: Secondary | ICD-10-CM

## 2025-01-06 ENCOUNTER — Other Ambulatory Visit: Payer: Self-pay

## 2025-01-06 ENCOUNTER — Other Ambulatory Visit (HOSPITAL_COMMUNITY): Payer: Self-pay

## 2025-01-06 MED FILL — Apixaban Tab 5 MG: ORAL | 30 days supply | Qty: 60 | Fill #0 | Status: AC

## 2025-01-09 ENCOUNTER — Encounter: Admitting: Family Medicine
